# Patient Record
Sex: Male | Born: 1975
Health system: Southern US, Community
[De-identification: ages and names within clinical notes are randomized; demographics above are authoritative.]

## PROBLEM LIST (undated history)

## (undated) DIAGNOSIS — K859 Acute pancreatitis without necrosis or infection, unspecified: Secondary | ICD-10-CM

## (undated) DIAGNOSIS — D701 Agranulocytosis secondary to cancer chemotherapy: Secondary | ICD-10-CM

## (undated) DIAGNOSIS — F17200 Nicotine dependence, unspecified, uncomplicated: Secondary | ICD-10-CM

## (undated) DIAGNOSIS — T451X5A Adverse effect of antineoplastic and immunosuppressive drugs, initial encounter: Secondary | ICD-10-CM

## (undated) DIAGNOSIS — D61818 Other pancytopenia: Secondary | ICD-10-CM

## (undated) DIAGNOSIS — D696 Thrombocytopenia, unspecified: Secondary | ICD-10-CM

## (undated) DIAGNOSIS — C9201 Acute myeloblastic leukemia, in remission: Secondary | ICD-10-CM

## (undated) HISTORY — DX: Acute myeloblastic leukemia, in remission: C92.01

## (undated) HISTORY — DX: Thrombocytopenia, unspecified: D69.6

## (undated) HISTORY — DX: Acute pancreatitis without necrosis or infection, unspecified: K85.90

## (undated) HISTORY — DX: Adverse effect of antineoplastic and immunosuppressive drugs, initial encounter: T45.1X5A

## (undated) HISTORY — DX: Agranulocytosis secondary to cancer chemotherapy: D70.1

## (undated) HISTORY — DX: Nicotine dependence, unspecified, uncomplicated: F17.200

## (undated) HISTORY — PX: BONE MARROW BIOPSY: SHX199

## (undated) HISTORY — DX: Other pancytopenia: D61.818

## (undated) NOTE — *Deleted (*Deleted)
[facility]  [address] [place], [place] [address] Phone: [phone]  Fax: [phone]   Clinic Day:  07/27/2020  Referring physician: Tarri Fuller, FNP  Chief Complaint: Carl [name]. is a 42 y.o. male with AML who is seen for 2 week assessment.  HPI: The patient was last seen in the medical oncology clinic on 07/14/2020. At that time, he was doing well.  He denied fevers, sweats or weight loss.  He had tenderness associated with a new port-a-cath.  The patient saw Dr. Oswald Hillock on 07/20/2020. No note.  He underwent tooth extraction (#17) on 07/26/2020.  The patient was admitted to [facility] on 07/25/2020 for cycle #1 HiDAC consolidation.  The patient had a tooth extracted (#17) on 07/26/2020.  [facility] labs followed: 07/25/2020: Hematocrit 42.4, hemoglobin 13.0, platelets 267,000, WBC 5,000. 07/26/2020: Hematocrit 41.2, hemoglobin 12.7, platelets 248,000, WBC 6,800.  During the interim, ***  No past medical history on file.  No past surgical history on file.  Family History  Problem Relation Age of Onset  . Prostate cancer Neg Hx   . Kidney cancer Neg Hx   . Bladder Cancer Neg Hx     Social History:  reports that he quit smoking about 4 months ago. His smoking use included cigarettes. He has a 17.00 pack-year smoking history. He has never used smokeless tobacco. He reports previous alcohol use of about 6.0 standard drinks of alcohol per week. He reports that he does not use drugs.The patient is alone*** today.  Allergies: No Known Allergies  Current Medications: Current Outpatient Medications  Medication Sig Dispense Refill  . folic acid (FOLVITE) 1 MG tablet Take 1 tablet (1 mg total) by mouth daily.    Marland Kitchen HYDROcodone-acetaminophen (NORCO) 5-325 MG tablet Take 1 tablet by mouth every 6 (six) hours as needed for moderate pain. 12 tablet 0  . magnesium oxide (MAG-OX) 400 MG tablet Take 800 mg by mouth daily.    . Multiple Vitamin  (MULTIVITAMIN WITH MINERALS) TABS tablet Take 1 tablet by mouth daily.    Marland Kitchen thiamine 100 MG tablet Take 1 tablet (100 mg total) by mouth daily.    . valACYclovir (VALTREX) 500 MG tablet Take 500 mg by mouth daily.     No current facility-administered medications for this visit.    Review of Systems  Constitutional: Negative for chills, diaphoresis, fever, malaise/fatigue and weight loss.  HENT: Negative for congestion, ear discharge, ear pain, hearing loss, nosebleeds, sinus pain, sore throat and tinnitus.   Eyes: Negative for blurred vision.  Respiratory: Positive for shortness of breath (improving). Negative for cough, hemoptysis and sputum production.   Cardiovascular: Negative for chest pain, palpitations and leg swelling.       Pain around port site.  Gastrointestinal: Negative for abdominal pain, blood in stool, constipation, diarrhea, heartburn, melena, nausea and vomiting.       Eating well. Hemorrhoids.  Genitourinary: Negative for dysuria, frequency, hematuria and urgency.  Musculoskeletal: Positive for joint pain (left rib x 1 day). Negative for back pain, myalgias and neck pain.  Skin: Negative for itching and rash.  Neurological: Negative for dizziness, tingling, sensory change, weakness and headaches.  Endo/Heme/Allergies: Does not bruise/bleed easily.  Psychiatric/Behavioral: Negative for depression and memory loss. The patient is not nervous/anxious and does not have insomnia.   All other systems reviewed and are negative.  Performance status (ECOG): 0***  Vitals There were no vitals taken for this visit.   Physical Exam Vitals and nursing  note reviewed.  Constitutional:      General: He is not in acute distress.    Appearance: He is not diaphoretic.  HENT:     Head: Normocephalic and atraumatic.     Mouth/Throat:     Mouth: Mucous membranes are moist.     Pharynx: Oropharynx is clear.  Eyes:     General: No scleral icterus.    Extraocular Movements:  Extraocular movements intact.     Conjunctiva/sclera: Conjunctivae normal.     Pupils: Pupils are equal, round, and reactive to light.  Cardiovascular:     Rate and Rhythm: Normal rate and regular rhythm.     Heart sounds: Normal heart sounds. No murmur heard.   Pulmonary:     Effort: Pulmonary effort is normal. No respiratory distress.     Breath sounds: Normal breath sounds. No wheezing or rales.  Chest:     Chest wall: No tenderness.     Comments: Right chest port-a-cath unremarkable.  Area surrounding port is tender to touch without increased warmth or erythema. Abdominal:     General: Bowel sounds are normal. There is no distension.     Palpations: Abdomen is soft. There is no hepatomegaly, splenomegaly or mass.     Tenderness: There is no abdominal tenderness. There is no guarding or rebound.  Musculoskeletal:        General: No swelling or tenderness. Normal range of motion.     Cervical back: Normal range of motion and neck supple.  Lymphadenopathy:     Head:     Right side of head: No preauricular, posterior auricular or occipital adenopathy.     Left side of head: No preauricular, posterior auricular or occipital adenopathy.     Cervical: No cervical adenopathy.     Upper Body:     Right upper body: No supraclavicular or axillary adenopathy.     Left upper body: No supraclavicular or axillary adenopathy.     Lower Body: No right inguinal adenopathy. No left inguinal adenopathy.  Skin:    General: Skin is warm and dry.  Neurological:     Mental Status: He is alert and oriented to person, place, and time.  Psychiatric:        Behavior: Behavior normal.        Thought Content: Thought content normal.        Judgment: Judgment normal.    No visits with results within 3 Day(s) from this visit.  Latest known visit with results is:  No results displayed because visit has over 200 results.      Assessment:  Carl [name]. is a 54 y.o. 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  He has received 2 courses of induction chemotherapy with daunorubicin and cytarabine.  Bone marrow on 04/01/2020 revealed AML.  Bone marrow on 04/26/2020 was hypocellular (5-10%) with 5% blasts.  Bone marrow on 05/24/2020 revealed 30% cellularity with 10% blasts and focal clustering up to 30-40%.  Bone marrow on 07/05/2020 revealed 50% cellularity with 2% blasts.  MRD was negative by flow cytometry.  He received cytarabine and daunorubicin (7+3) beginning 04/13/2020 and 05/27/2020.  He is scheduled to begin HiDAC on 07/25/2020.  Course has been complicated by fever and neutropenia with mucositis (04/27/2020) and gluteal cleft cellulitis (05/06/2020).  He has G6PD deficiency.  G6PD was 1.0 (5.3-10.3) on 04/02/2020.  He received the first COVID-19 vaccine on 07/27/2020.  Symptomatically, ***  Plan: 1.   Acute myelogenous leukemia  Review  entire medical history, diagnoses and treatment to date.  Review complications of treatment with first 2 cycles of induction therapy.  Review transfusion support needed with induction therapy.  Discuss patient's thoughts about stem cell transplant.   Patient is considering.   Review plan for cycle #1 consolidation chemotherapy (high-dose ara-C) on 07/25/2020.   Discuss plans for growth factor support Criss Alvine) on 07/29/2020.  Discuss follow-up counts in [place] and transfusion support.   Twice weekly labs after his chemotherapy, starting around 07/29/2020.   Blood products are leukoreduced and irradiated.   RBC transfusion threshold: transfuse 2 units for Hgb < 8 g/dL.   Platelet transfusion threshold: transfuse 1 unit of platelets for platelet count < 10,000, or for bleeding or need for invasive procedure. 2.   Port-a-cath pain  Patient has pain s/p recent port placement.  No evidence of infection.  Rx: hydrocodone-acetaminophen 5/325 mg 1 tablet po q 6 hours prn pain (dis: #12) 3.   RTC on 07/28/2020 for MD  assessment. 4.   RTC on 07/29/2020 in [place] for [place]. 5.   RTC weekly on Mondays and Thursdays x 4 weeks for labs (CBC with diff and hold tube-on Mondays only). 6.   RTC on 08/01/2020 for MD assessment and labs (CBC with diff, CMP, hold tube).   I discussed the assessment and treatment plan with the patient.  The patient was provided an opportunity to ask questions and all were answered.  The patient agreed with the plan and demonstrated an understanding of the instructions.  The patient was advised to call back if the symptoms worsen or if the condition fails to improve as anticipated.  I provided *** minutes of face-to-face time during this this encounter and > 50% was spent counseling as documented under my assessment and plan.  Melissa C. Merlene Pulling, MD, PhD    07/27/2020, 2:32 PM  I, Danella Penton Tufford, am acting as Neurosurgeon for [employer]. Merlene Pulling, MD, PhD.  I, Melissa C. Merlene Pulling, MD, have reviewed the above documentation for accuracy and completeness, and I agree with the above.

---

## 2004-10-19 ENCOUNTER — Emergency Department: Payer: Self-pay | Admitting: Emergency Medicine

## 2007-09-12 ENCOUNTER — Emergency Department: Payer: Self-pay | Admitting: Internal Medicine

## 2008-03-02 ENCOUNTER — Emergency Department: Payer: Self-pay | Admitting: Emergency Medicine

## 2008-10-20 ENCOUNTER — Emergency Department: Payer: Self-pay | Admitting: Internal Medicine

## 2009-02-01 ENCOUNTER — Emergency Department: Payer: Self-pay | Admitting: Emergency Medicine

## 2010-08-01 ENCOUNTER — Emergency Department: Payer: Self-pay | Admitting: Internal Medicine

## 2013-06-15 ENCOUNTER — Emergency Department: Payer: Self-pay | Admitting: Emergency Medicine

## 2017-10-29 ENCOUNTER — Ambulatory Visit
Admission: RE | Admit: 2017-10-29 | Discharge: 2017-10-29 | Disposition: A | Discharge status: Home / Self Care | Payer: BLUE CROSS/BLUE SHIELD | Source: Ambulatory Visit | Attending: Family Medicine | Admitting: Family Medicine

## 2017-10-29 ENCOUNTER — Other Ambulatory Visit: Payer: Self-pay | Admitting: Family Medicine

## 2017-10-29 DIAGNOSIS — N50812 Left testicular pain: Secondary | ICD-10-CM | POA: Insufficient documentation

## 2017-10-29 DIAGNOSIS — I861 Scrotal varices: Secondary | ICD-10-CM | POA: Diagnosis not present

## 2017-10-31 ENCOUNTER — Encounter: Payer: Self-pay | Admitting: Urology

## 2017-10-31 ENCOUNTER — Ambulatory Visit: Payer: BLUE CROSS/BLUE SHIELD | Admitting: Urology

## 2017-11-01 ENCOUNTER — Ambulatory Visit (INDEPENDENT_AMBULATORY_CARE_PROVIDER_SITE_OTHER): Payer: BLUE CROSS/BLUE SHIELD | Admitting: Urology

## 2017-11-01 ENCOUNTER — Encounter: Payer: Self-pay | Admitting: Urology

## 2017-11-01 VITALS — BP 138/77 | HR 75 | Resp 16 | Ht 69.0 in | Wt 177.8 lb

## 2017-11-01 DIAGNOSIS — N5082 Scrotal pain: Secondary | ICD-10-CM | POA: Diagnosis not present

## 2017-11-01 MED ORDER — MELOXICAM 15 MG PO TABS: 15.0000 mg | ORAL_TABLET | Freq: Every day | ORAL | 0 refills | Status: DC

## 2017-11-01 NOTE — Progress Notes (Signed)
11/01/2017 1:55 PM   Carl [name]. Sep 12, 1975 [id number]  Referring provider: No referring provider defined for this encounter.  Chief complaint: Left testicular pain  HPI: Carl [name] is a 42 year old male who presents with a 6-day history of left scrotal pain.  He states his job involves heavy lifting and bending and earlier this week he had onset of left hemiscrotal pain.  It was usually worse with bending and lifting.  He denies previous history of urologic problems or scrotal pain.  He has no voiding complaints.  Denies dysuria or gross hematuria.  Denies flank, abdominal, pelvic pain.  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  There were no intratesticular masses.  A left varicocele was noted.  He states his pain has improved.  He was placed on meloxicam.   PMH: No past medical history on file.  Surgical History: None  Home Medications:  Allergies as of 11/01/2017   No Known Allergies     Medication List        Accurate as of 11/01/17  1:55 PM. Always use your most recent med list.          meloxicam 15 MG tablet Commonly known as:  MOBIC       Allergies: No Known Allergies  Family History: Family History  Problem Relation Age of Onset  . Prostate cancer Neg Hx   . Kidney cancer Neg Hx   . Bladder Cancer Neg Hx     Social History:  reports that he has quit smoking. He quit after 17.00 years of use. he has never used smokeless tobacco. He reports that he drinks alcohol. He reports that he does not use drugs.  ROS: UROLOGY Frequent Urination?: No Hard to postpone urination?: No Burning/pain with urination?: No Get up at night to urinate?: No Leakage of urine?: No Urine stream starts and stops?: No Trouble starting stream?: No Do you have to strain to urinate?: No Blood in urine?: No Urinary tract infection?: No Sexually transmitted disease?: No Injury to  kidneys or bladder?: No Painful intercourse?: No Weak stream?: No Erection problems?: No Penile pain?: No  Gastrointestinal Nausea?: No Vomiting?: No Indigestion/heartburn?: No Diarrhea?: No Constipation?: No  Constitutional Fever: No Night sweats?: No Weight loss?: No Fatigue?: No  Skin Skin rash/lesions?: No Itching?: No  Eyes Blurred vision?: No Double vision?: No  Ears/Nose/Throat Sore throat?: No Sinus problems?: No  Hematologic/Lymphatic Swollen glands?: No Easy bruising?: No  Cardiovascular Leg swelling?: No Chest pain?: No  Respiratory Cough?: No Shortness of breath?: No  Endocrine Excessive thirst?: No  Musculoskeletal Back pain?: Yes Joint pain?: No  Neurological Headaches?: No Dizziness?: No  Psychologic Depression?: No Anxiety?: No  Physical Exam: BP 138/77   Pulse 75   Resp 16   Ht 5\' 9"  (1.753 m)   Wt 177 lb 12.8 oz (80.6 kg)   SpO2 98%   BMI 26.26 kg/m   Constitutional:  Alert and oriented, No acute distress. HEENT: [place] AT, moist mucus membranes.  Trachea midline, no masses. Cardiovascular: No clubbing, cyanosis, or edema. Respiratory: Normal respiratory effort, no increased work of breathing. GI: Abdomen is soft, nontender, nondistended, no abdominal masses GU: No CVA tenderness.  Penis uncircumcised without lesions.  Testes descended bilaterally.  The left testis is slightly atrophic.  A moderate left varicocele is present.  No epididymal, testicular or scrotal tenderness noted. Lymph: No cervical or inguinal lymphadenopathy. Skin: No rashes, bruises or suspicious  lesions. Neurologic: Grossly intact, no focal deficits, moving all 4 extremities. Psychiatric: Normal mood and affect.   Pertinent Imaging: Ultrasound images were not available for review  Assessment & Plan:   42 year old male with left scrotal pain.  His pain is not typical for varicocele related pain.  He was wearing loose boxer briefs.  I recommended  minimal activity over the weekend and compression shorts.  Refill of meloxicam was sent to his pharmacy.  Follow-up as needed.  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  Monthly testicular self-exam was recommended.    Return if symptoms worsen or fail to improve.  Abbie Sons, [place] [address], [place], [place] [address] [phone]

## 2017-11-06 ENCOUNTER — Other Ambulatory Visit: Payer: Self-pay

## 2018-01-13 ENCOUNTER — Emergency Department
Admission: EM | Admit: 2018-01-13 | Discharge: 2018-01-13 | Disposition: A | Discharge status: Home / Self Care | Payer: BLUE CROSS/BLUE SHIELD | Attending: Emergency Medicine | Admitting: Emergency Medicine

## 2018-01-13 ENCOUNTER — Other Ambulatory Visit: Payer: Self-pay

## 2018-01-13 ENCOUNTER — Emergency Department: Payer: BLUE CROSS/BLUE SHIELD

## 2018-01-13 ENCOUNTER — Encounter: Payer: Self-pay | Admitting: Emergency Medicine

## 2018-01-13 DIAGNOSIS — M419 Scoliosis, unspecified: Secondary | ICD-10-CM | POA: Diagnosis not present

## 2018-01-13 DIAGNOSIS — Z87891 Personal history of nicotine dependence: Secondary | ICD-10-CM | POA: Diagnosis not present

## 2018-01-13 DIAGNOSIS — Z79899 Other long term (current) drug therapy: Secondary | ICD-10-CM | POA: Diagnosis not present

## 2018-01-13 DIAGNOSIS — M4183 Other forms of scoliosis, cervicothoracic region: Secondary | ICD-10-CM | POA: Diagnosis not present

## 2018-01-13 DIAGNOSIS — M546 Pain in thoracic spine: Secondary | ICD-10-CM | POA: Diagnosis not present

## 2018-01-13 DIAGNOSIS — M549 Dorsalgia, unspecified: Secondary | ICD-10-CM | POA: Diagnosis not present

## 2018-01-13 MED ORDER — IBUPROFEN 600 MG PO TABS: 600.0000 mg | ORAL_TABLET | Freq: Three times a day (TID) | ORAL | 0 refills | Status: DC | PRN

## 2018-01-13 MED ORDER — CYCLOBENZAPRINE HCL 10 MG PO TABS: 10.0000 mg | ORAL_TABLET | Freq: Three times a day (TID) | ORAL | 0 refills | Status: DC | PRN

## 2018-01-13 NOTE — ED Notes (Signed)
See triage note  Presents with pain to upper back   Pain is mainly under right shoulder blade  Denies any injury  States pain is increased with cough or movement

## 2018-01-13 NOTE — ED Provider Notes (Signed)
[facility] Emergency Department Provider Note   ____________________________________________   First MD Initiated Contact with Patient 01/13/18 1001     (approximate)  I have reviewed the triage vital signs and the nursing notes.   HISTORY  Chief Complaint Back Pain    HPI Carl [name]. is a 42 y.o. male patient complain of 2 days of increasing right upper back pain without provocative incident.  Patient worked normally requires repetitive overhead heavy lifting.  Patient admits to positive tobacco use.  Patient complaint increase with coughing, shoulder abduction, and overhead reaching.  Patient rates the pain as a 9/10.  No palliative measures for complaint.  Patient described the pain is "achy".  History reviewed. No pertinent past medical history.  There are no active problems to display for this patient.   History reviewed. No pertinent surgical history.  Prior to Admission medications   Medication Sig Start Date End Date Taking? Authorizing Provider  cyclobenzaprine (FLEXERIL) 10 MG tablet Take 1 tablet (10 mg total) by mouth 3 (three) times daily as needed. 01/13/18   Sable Feil, PA-C  ibuprofen (ADVIL,MOTRIN) 600 MG tablet Take 1 tablet (600 mg total) by mouth every 8 (eight) hours as needed. 01/13/18   Sable Feil, PA-C  meloxicam (MOBIC) 15 MG tablet Take 1 tablet (15 mg total) by mouth daily. 11/01/17   Stoioff, Ronda Fairly, MD    Allergies Patient has no known allergies.  Family History  Problem Relation Age of Onset  . Prostate cancer Neg Hx   . Kidney cancer Neg Hx   . Bladder Cancer Neg Hx     Social History Social History   Tobacco Use  . Smoking status: Former Smoker    Years: 17.00  . Smokeless tobacco: Never Used  Substance Use Topics  . Alcohol use: Yes  . Drug use: No    Review of Systems Constitutional: No fever/chills Eyes: No visual changes. ENT: No sore throat. Cardiovascular: Denies chest  pain. Respiratory: Denies shortness of breath. Gastrointestinal: No abdominal pain.  No nausea, no vomiting.  No diarrhea.  No constipation. Genitourinary: Negative for dysuria. Musculoskeletal: Positive for upper back pain. Skin: Negative for rash. Neurological: Negative for headaches, focal weakness or numbness.   ____________________________________________   PHYSICAL EXAM:  VITAL SIGNS: ED Triage Vitals  Enc Vitals Group     BP 01/13/18 0952 111/64     Pulse Rate 01/13/18 0952 (!) 59     Resp 01/13/18 0952 18     Temp 01/13/18 0952 (!) 97.5 F (36.4 C)     Temp Source 01/13/18 0952 Oral     SpO2 01/13/18 0952 100 %     Weight 01/13/18 0943 175 lb (79.4 kg)     Height 01/13/18 0943 5\' 9"  (1.753 m)     Head Circumference --      Peak Flow --      Pain Score 01/13/18 0943 9     Pain Loc --      Pain Edu? --      Excl. in [place]? --    Constitutional: Alert and oriented. Well appearing and in no acute distress. Cardiovascular: Normal rate, regular rhythm. Grossly normal heart sounds.  Good peripheral circulation. Respiratory: Normal respiratory effort.  No retractions. Lungs CTAB. Musculoskeletal: Obvious thoracic deformity.  Patient has decreased range of motion with abduction overhead reaching of the right upper extremity.  Patient points to the scapular muscle group as a source of pain. Neurologic:  Normal speech and language. No gross focal neurologic deficits are appreciated. No gait instability. Skin:  Skin is warm, dry and intact. No rash noted. Psychiatric: Mood and affect are normal. Speech and behavior are normal.  ____________________________________________   LABS (all labs ordered are listed, but only abnormal results are displayed)  Labs Reviewed - No data to display ____________________________________________  EKG   ____________________________________________  RADIOLOGY  ED MD interpretation:    Official radiology report(s): Dg Thoracic Spine 2  View  Result Date: 01/13/2018 CLINICAL DATA:  Upper back pain, right greater than left. EXAM: THORACIC SPINE 2 VIEWS COMPARISON:  None. FINDINGS: 16 degrees dextroconvex scoliosis centered at T5. Lack of fusion of the posterior elements involving C6 through T3. No additional definitive segmentation anomalies. Vertebral body height is normal. Cervicothoracic junction is in alignment. Visualized portion of the chest is unremarkable. IMPRESSION: Dextroconvex scoliosis centered at T5, with lack of fusion of the posterior elements involving C6 through T3. Electronically Signed   By: Lorin Picket M.D.   On: 01/13/2018 11:02    ____________________________________________   PROCEDURES  Procedure(s) performed: None  Procedures  Critical Care performed: No  ____________________________________________   INITIAL IMPRESSION / ASSESSMENT AND PLAN / ED COURSE  As part of my medical decision making, I reviewed the following data within the electronic MEDICAL RECORD NUMBER    Right upper back pain secondary to scoliosis.  Discussed x-ray findings with patient.  Patient given discharge care instruction advised follow-up with orthopedics.  Take medication as directed.      ____________________________________________   FINAL CLINICAL IMPRESSION(S) / ED DIAGNOSES  Final diagnoses:  Scoliosis of cervicothoracic spine, unspecified scoliosis type     ED Discharge Orders        Ordered    cyclobenzaprine (FLEXERIL) 10 MG tablet  3 times daily PRN     01/13/18 1140    ibuprofen (ADVIL,MOTRIN) 600 MG tablet  Every 8 hours PRN     01/13/18 1140       Note:  This document was prepared using Dragon voice recognition software and may include unintentional dictation errors.    Sable Feil, PA-C 01/13/18 1144    Schaevitz, Randall An, MD 01/13/18 1328

## 2018-01-13 NOTE — Discharge Instructions (Signed)
Call orthopedic clinic to schedule appointment this week for definitive evaluation and treatment.

## 2018-01-13 NOTE — ED Triage Notes (Signed)
Back pain .  Denies injury.  stareted 2 day ago when he woke up

## 2018-01-16 DIAGNOSIS — M419 Scoliosis, unspecified: Secondary | ICD-10-CM | POA: Diagnosis not present

## 2019-01-05 ENCOUNTER — Telehealth: Payer: Self-pay | Admitting: Podiatry

## 2019-01-05 NOTE — Telephone Encounter (Signed)
Pt is scheduled for an appt on 5/19 but is concerned about his toenails being dark/black and would like to know if there is something he can do in the meantime. Pt was unable to come in until the 19th due to his work schedule. Please give patient a call.

## 2019-01-07 NOTE — Telephone Encounter (Signed)
Returned call ,left message to call back.

## 2019-01-13 ENCOUNTER — Ambulatory Visit: Payer: BLUE CROSS/BLUE SHIELD | Admitting: Podiatry

## 2019-11-20 ENCOUNTER — Ambulatory Visit: Payer: BLUE CROSS/BLUE SHIELD | Admitting: Family Medicine

## 2019-11-23 ENCOUNTER — Encounter: Payer: Self-pay | Admitting: Family Medicine

## 2019-11-23 ENCOUNTER — Other Ambulatory Visit: Payer: Self-pay

## 2019-11-23 ENCOUNTER — Ambulatory Visit (INDEPENDENT_AMBULATORY_CARE_PROVIDER_SITE_OTHER): Payer: BC Managed Care – PPO | Admitting: Family Medicine

## 2019-11-23 VITALS — BP 126/63 | HR 84 | Temp 97.1°F | Ht 70.0 in | Wt 179.6 lb

## 2019-11-23 DIAGNOSIS — R3129 Other microscopic hematuria: Secondary | ICD-10-CM

## 2019-11-23 DIAGNOSIS — M545 Low back pain, unspecified: Secondary | ICD-10-CM | POA: Insufficient documentation

## 2019-11-23 DIAGNOSIS — R109 Unspecified abdominal pain: Secondary | ICD-10-CM

## 2019-11-23 DIAGNOSIS — Z0000 Encounter for general adult medical examination without abnormal findings: Secondary | ICD-10-CM

## 2019-11-23 DIAGNOSIS — G8929 Other chronic pain: Secondary | ICD-10-CM

## 2019-11-23 DIAGNOSIS — M549 Dorsalgia, unspecified: Secondary | ICD-10-CM | POA: Insufficient documentation

## 2019-11-23 DIAGNOSIS — Z7689 Persons encountering health services in other specified circumstances: Secondary | ICD-10-CM

## 2019-11-23 LAB — POCT URINALYSIS DIPSTICK
Glucose, UA: NEGATIVE
Ketones, UA: NEGATIVE
Leukocytes, UA: NEGATIVE
Nitrite, UA: NEGATIVE
Protein, UA: NEGATIVE
Spec Grav, UA: 1.01 (ref 1.010–1.025)
Urobilinogen, UA: 0.2 E.U./dL
pH, UA: 5 (ref 5.0–8.0)

## 2019-11-23 NOTE — Patient Instructions (Addendum)
As we discussed, have your labs drawn in the next 1-2 weeks and we will contact you with the results.  Reduce your soda and coffee intake a little bit each day and begin incorporating more water every day.  Should aim for 64 ounces of water daily.  We have sent your urine for microscopy and will contact you once we receive the results.  With your reported history of scoliosis and intermittent flares of lower back pain, I have put in a referral to physical therapy to help with stretching and strengthening your lower back and core.             Low Back Pain Exercises  See other page with pictures of each exercise.  Start with 1 or 2 of these exercises that you are most comfortable with. Do not do any exercises that cause you significant worsening pain. Some of these may cause some "stretching soreness" but it should go away after you stop the exercise, and get better over time. Gradually increase up to 3-4 exercises as tolerated.  Standing hamstring stretch: Place the heel of your leg on a stool about 15 inches high. Keep your knee straight. Lean forward, bending at the hips until you feel a mild stretch in the back of your thigh. Make sure you do not roll your shoulders and bend at the waist when doing this or you will stretch your lower back instead. Hold the stretch for 15 to 30 seconds. Repeat 3 times. Repeat the same stretch on your other leg.  Cat and camel: Get down on your hands and knees. Let your stomach sag, allowing your back to curve downward. Hold this position for 5 seconds. Then arch your back and hold for 5 seconds. Do 3 sets of 10.  Quadriped Arm/Leg Raises: Get down on your hands and knees. Tighten your abdominal muscles to stiffen your spine. While keeping your abdominals tight, raise one arm and the opposite leg away from you. Hold this position for 5 seconds. Lower your arm and leg slowly and alternate sides. Do this 10 times on each side.  Pelvic tilt: Lie on your  back with your knees bent and your feet flat on the floor. Tighten your abdominal muscles and push your lower back into the floor. Hold this position for 5 seconds, then relax. Do 3 sets of 10.  Partial curl: Lie on your back with your knees bent and your feet flat on the floor. Tighten your stomach muscles and flatten your back against the floor. Tuck your chin to your chest. With your hands stretched out in front of you, curl your upper body forward until your shoulders clear the floor. Hold this position for 3 seconds. Don't hold your breath. It helps to breathe out as you lift your shoulders up. Relax. Repeat 10 times. Build to 3 sets of 10. To challenge yourself, clasp your hands behind your head and keep your elbows out to the side.  Lower trunk rotation: Lie on your back with your knees bent and your feet flat on the floor. Tighten your abdominal muscles and push your lower back into the floor. Keeping your shoulders down flat, gently rotate your legs to one side, then the other as far as you can. Repeat 10 to 20 times.  Single knee to chest stretch: Lie on your back with your legs straight out in front of you. Bring one knee up to your chest and grasp the back of your thigh. Pull your knee toward  your chest, stretching your buttock muscle. Hold this position for 15 to 30 seconds and return to the starting position. Repeat 3 times on each side.  Double knee to chest: Lie on your back with your knees bent and your feet flat on the floor. Tighten your abdominal muscles and push your lower back into the floor. Pull both knees up to your chest. Hold for 5 seconds and repeat 10 to 20 times.  We will plan to see you back in 1 month for your yearly physical wellness exam and preventative screenings.  You will receive a survey after today's visit either digitally by e-mail or paper by [employer]. Your experiences and feedback matter to [name].  Please respond so we know how we are doing as we provide care for  you.  Call us with any questions/concerns/needs.  It is my goal to be available to you for your health concerns.  Thanks for choosing me to be a partner in your healthcare needs!  Harlin Rain, FNP-C Family Nurse Practitioner [facility] Phone: [phone]

## 2019-11-23 NOTE — Assessment & Plan Note (Signed)
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  Reports has not been to physical therapy and without any structured exercise routine.  Plan: 1) Begin physical therapy and begin working on strengthening your lower back and core to help reduce low back pain and protect from injuries

## 2019-11-23 NOTE — Progress Notes (Signed)
Subjective:    Patient ID: Carl [name]., male    DOB: Aug 21, 1976, 44 y.o.   MRN: [id number]  Carl [name]. is a 44 y.o. male presenting on 11/23/2019 for [place] (intermittent left side flank pain x 3 days. He denies any urinary issues, no urgency, dysuria or frequency. He state that the pain was so severe on Friday that he had to leave work early. The pain has since improved, but not resolved. )   HPI  Previous PCP was at Dr. Soyla Dryer in [place], [place] probably seen last within the last 3 years.  Records will be requested.  Past medical, family, and surgical history reviewed w/ pt.  Has acute concerns of left sided flank pain that has come and gone in the past, recent flare on Friday, that caused him to leave work early and stay in bed most of Saturday.  Denied anything that made it better or worse but eased on its own.  Reports last month the same thing happened and fully resolved until Friday.    Depression screen PHQ 2/9 11/23/2019  Decreased Interest 0  Down, Depressed, Hopeless 0  PHQ - 2 Score 0    Social History   Tobacco Use  . Smoking status: Current Every Day Smoker    Packs/day: 1.00    Years: 17.00    Pack years: 17.00    Types: Cigarettes  . Smokeless tobacco: Never Used  Substance Use Topics  . Alcohol use: Yes    Alcohol/week: 6.0 standard drinks    Types: 6 Cans of beer per week    Comment: 6-24oz beers on weekly bases  . Drug use: No    Review of Systems  Constitutional: Negative.   HENT: Negative.   Eyes: Negative.   Respiratory: Negative.   Cardiovascular: Negative.   Gastrointestinal: Negative.   Endocrine: Negative.   Genitourinary: Positive for flank pain. Negative for decreased urine volume, difficulty urinating, discharge, dysuria, enuresis, frequency, genital sores, hematuria, penile pain, penile swelling, scrotal swelling, testicular pain and urgency.  Musculoskeletal: Positive for back pain. Negative for arthralgias, gait problem,  joint swelling, myalgias, neck pain and neck stiffness.  Skin: Negative.   Allergic/Immunologic: Negative.   Neurological: Negative.   Hematological: Negative.   Psychiatric/Behavioral: Negative.    Per HPI unless specifically indicated above     Objective:    BP 126/63 (BP Location: Left Arm, Patient Position: Sitting, Cuff Size: Normal)   Pulse 84   Temp (!) 97.1 F (36.2 C) (Temporal)   Ht 5\' 10"  (1.778 m)   Wt 179 lb 9.6 oz (81.5 kg)   BMI 25.77 kg/m   Wt Readings from Last 3 Encounters:  11/23/19 179 lb 9.6 oz (81.5 kg)  01/13/18 175 lb (79.4 kg)  11/01/17 177 lb 12.8 oz (80.6 kg)    Physical Exam Vitals reviewed.  Constitutional:      General: He is not in acute distress.    Appearance: Normal appearance. He is well-developed, well-groomed and overweight. He is not ill-appearing or toxic-appearing.  HENT:     Head: Normocephalic.     Right Ear: Tympanic membrane, ear canal and external ear normal. There is no impacted cerumen.     Left Ear: Tympanic membrane, ear canal and external ear normal. There is no impacted cerumen.     Nose: Nose normal. No congestion or rhinorrhea.     Mouth/Throat:     Mouth: Mucous membranes are moist.     Pharynx: Oropharynx is clear.  No oropharyngeal exudate or posterior oropharyngeal erythema.  Eyes:     General: Lids are normal. Vision grossly intact. No scleral icterus.       Right eye: No discharge or hordeolum.        Left eye: No discharge or hordeolum.     Extraocular Movements: Extraocular movements intact.     Conjunctiva/sclera: Conjunctivae normal.     Pupils: Pupils are equal, round, and reactive to light.  Neck:     Thyroid: No thyroid mass, thyromegaly or thyroid tenderness.  Cardiovascular:     Rate and Rhythm: Normal rate and regular rhythm.     Pulses: Normal pulses.          Dorsalis pedis pulses are 2+ on the right side and 2+ on the left side.       Posterior tibial pulses are 2+ on the right side and 2+ on the  left side.     Heart sounds: Normal heart sounds. No murmur. No friction rub. No gallop.   Pulmonary:     Effort: Pulmonary effort is normal. No respiratory distress.     Breath sounds: Normal breath sounds.  Abdominal:     General: Abdomen is flat. Bowel sounds are normal. There is no distension or abdominal bruit.     Palpations: Abdomen is soft. There is no hepatomegaly, splenomegaly or mass.     Tenderness: There is no abdominal tenderness. There is no guarding or rebound.     Hernia: No hernia is present.  Musculoskeletal:        General: No swelling, tenderness or deformity. Normal range of motion.     Cervical back: Normal, normal range of motion and neck supple. No tenderness.     Thoracic back: Normal.     Lumbar back: No swelling, edema, deformity, signs of trauma, lacerations, spasms, tenderness or bony tenderness. Normal range of motion. Negative right straight leg raise test and negative left straight leg raise test. No scoliosis.     Right lower leg: No edema.     Left lower leg: No edema.  Feet:     Right foot:     Skin integrity: Skin integrity normal.     Left foot:     Skin integrity: Skin integrity normal.  Lymphadenopathy:     Cervical: No cervical adenopathy.  Skin:    General: Skin is warm and dry.     Capillary Refill: Capillary refill takes less than 2 seconds.  Neurological:     General: No focal deficit present.     Mental Status: He is alert and oriented to person, place, and time.     Cranial Nerves: Cranial nerves are intact.     Sensory: Sensation is intact.     Motor: Motor function is intact.     Coordination: Coordination is intact.     Gait: Gait is intact.     Deep Tendon Reflexes: Reflexes normal.  Psychiatric:        Attention and Perception: Attention and perception normal.        Mood and Affect: Mood and affect normal.        Speech: Speech normal.        Behavior: Behavior normal. Behavior is cooperative.        Thought Content:  Thought content normal.        Cognition and Memory: Cognition and memory normal.        Judgment: Judgment normal.     Results for orders placed  or performed in visit on 11/23/19  POCT Urinalysis Dipstick  Result Value Ref Range   Color, UA yellow    Clarity, UA clear    Glucose, UA Negative Negative   Bilirubin, UA small    Ketones, UA negative    Spec Grav, UA 1.010 1.010 - 1.025   Blood, UA moderate    pH, UA 5.0 5.0 - 8.0   Protein, UA Negative Negative   Urobilinogen, UA 0.2 0.2 or 1.0 E.U./dL   Nitrite, UA negative    Leukocytes, UA Negative Negative   Appearance     Odor        Assessment & Plan:   Problem List Items Addressed This Visit      Other   Flank pain - Primary    Flank pain, discussed could be kidney stones based on the coming/going of pain and negative U/A POCT for leukocytes or nitrites.  Blood on U/A POCT, will send to lab for microscopy for further evaluation and based on results will repeat/send to urology for evaluation.  Plan: 1) Urine sent to the lab for microscopy 2) Once results are received, will contact patient and update treatment plan.      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  Reports has not been to physical therapy and without any structured exercise routine.  Plan: 1) Begin physical therapy and begin working on strengthening your lower back and core to help reduce low back pain and protect from injuries      Relevant Orders   Ambulatory referral to Physical Therapy   Encounter to establish care with new doctor    Here as new patient establishment, last followed with provider, Dr. Soyla Dryer in [place], last visit approx 2018, will request records for review.  Plan: 1) Will have labs drawn in the next 1-2 weeks and will contact with the results 2) Will see  back in clinic for physical and preventative care screenings in next 4-6 weeks. 3) See Flank pain A/P       Other Visit Diagnoses    General medical exam       Relevant Orders   CBC with Differential   COMPLETE METABOLIC PANEL WITH GFR   Lipid Profile   Thyroid Panel With TSH   HgB A1c   Microscopic hematuria       Relevant Orders   Urinalysis, microscopic only      No orders of the defined types were placed in this encounter.     Follow up plan: Return in about 4 weeks (around 12/21/2019) for Physical.   Harlin Rain, [place] Nurse Practitioner [facility] 11/23/2019, 3:28 PM

## 2019-11-23 NOTE — Assessment & Plan Note (Signed)
Here as new patient establishment, last followed with provider, Dr. Soyla Dryer in [place], last visit approx 2018, will request records for review.  Plan: 1) Will have labs drawn in the next 1-2 weeks and will contact with the results 2) Will see back in clinic for physical and preventative care screenings in next 4-6 weeks. 3) See Flank pain A/P

## 2019-11-23 NOTE — Assessment & Plan Note (Signed)
Flank pain, discussed could be kidney stones based on the coming/going of pain and negative U/A POCT for leukocytes or nitrites.  Blood on U/A POCT, will send to lab for microscopy for further evaluation and based on results will repeat/send to urology for evaluation.  Plan: 1) Urine sent to the lab for microscopy 2) Once results are received, will contact patient and update treatment plan.

## 2019-11-24 ENCOUNTER — Other Ambulatory Visit: Payer: Self-pay | Admitting: Family Medicine

## 2019-11-24 DIAGNOSIS — R8290 Unspecified abnormal findings in urine: Secondary | ICD-10-CM

## 2019-11-24 LAB — URINALYSIS, MICROSCOPIC ONLY
Bacteria, UA: NONE SEEN /HPF
Hyaline Cast: NONE SEEN /LPF
RBC / HPF: NONE SEEN /HPF (ref 0–2)
Squamous Epithelial / LPF: NONE SEEN /HPF (ref <=5)
WBC, UA: NONE SEEN /HPF (ref 0–5)

## 2019-11-24 NOTE — Progress Notes (Signed)
U/A Microscopy did not show any blood.  We can plan to repeat a U/A in 2 weeks to make sure has resolved.  I put in the order.  Thanks

## 2019-12-10 ENCOUNTER — Other Ambulatory Visit: Payer: BC Managed Care – PPO

## 2019-12-10 ENCOUNTER — Other Ambulatory Visit: Payer: Self-pay

## 2019-12-11 ENCOUNTER — Other Ambulatory Visit: Payer: Self-pay | Admitting: Family Medicine

## 2019-12-11 DIAGNOSIS — R7989 Other specified abnormal findings of blood chemistry: Secondary | ICD-10-CM

## 2019-12-11 LAB — URINALYSIS, ROUTINE W REFLEX MICROSCOPIC
Bilirubin Urine: NEGATIVE
Glucose, UA: NEGATIVE
Hgb urine dipstick: NEGATIVE
Ketones, ur: NEGATIVE
Leukocytes,Ua: NEGATIVE
Nitrite: NEGATIVE
Protein, ur: NEGATIVE
Specific Gravity, Urine: 1.016 (ref 1.001–1.03)
pH: 7.5 (ref 5.0–8.0)

## 2019-12-11 LAB — CBC WITH DIFFERENTIAL/PLATELET
Absolute Monocytes: 42 cells/uL — ABNORMAL LOW (ref 200–950)
Basophils Absolute: 0 cells/uL (ref 0–200)
Basophils Relative: 0 %
Eosinophils Absolute: 11 cells/uL — ABNORMAL LOW (ref 15–500)
Eosinophils Relative: 1 %
HCT: 36.9 % — ABNORMAL LOW (ref 38.5–50.0)
Hemoglobin: 12.5 g/dL — ABNORMAL LOW (ref 13.2–17.1)
Lymphs Abs: 713 cells/uL — ABNORMAL LOW (ref 850–3900)
MCH: 35.6 pg — ABNORMAL HIGH (ref 27.0–33.0)
MCHC: 33.9 g/dL (ref 32.0–36.0)
MCV: 105.1 fL — ABNORMAL HIGH (ref 80.0–100.0)
MPV: 10.1 fL (ref 7.5–12.5)
Monocytes Relative: 3.8 %
Neutro Abs: 334 cells/uL — CL (ref 1500–7800)
Neutrophils Relative %: 30.4 %
Platelets: 131 10*3/uL — ABNORMAL LOW (ref 140–400)
RBC: 3.51 10*6/uL — ABNORMAL LOW (ref 4.20–5.80)
RDW: 14.2 % (ref 11.0–15.0)
Total Lymphocyte: 64.8 %
WBC: 1.1 10*3/uL — ABNORMAL LOW (ref 3.8–10.8)

## 2019-12-11 LAB — COMPLETE METABOLIC PANEL WITH GFR
AG Ratio: 1.5 (calc) (ref 1.0–2.5)
ALT: 28 U/L (ref 9–46)
AST: 22 U/L (ref 10–40)
Albumin: 4.4 g/dL (ref 3.6–5.1)
Alkaline phosphatase (APISO): 87 U/L (ref 36–130)
BUN: 13 mg/dL (ref 7–25)
CO2: 26 mmol/L (ref 20–32)
Calcium: 9.4 mg/dL (ref 8.6–10.3)
Chloride: 103 mmol/L (ref 98–110)
Creat: 0.94 mg/dL (ref 0.60–1.35)
GFR, Est African American: 114 mL/min/{1.73_m2} (ref >=60)
GFR, Est Non African American: 98 mL/min/{1.73_m2} (ref >=60)
Globulin: 2.9 g/dL (calc) (ref 1.9–3.7)
Glucose, Bld: 96 mg/dL (ref 65–99)
Potassium: 4.8 mmol/L (ref 3.5–5.3)
Sodium: 136 mmol/L (ref 135–146)
Total Bilirubin: 0.7 mg/dL (ref 0.2–1.2)
Total Protein: 7.3 g/dL (ref 6.1–8.1)

## 2019-12-11 LAB — THYROID PANEL WITH TSH
Free Thyroxine Index: 1.7 (ref 1.4–3.8)
T3 Uptake: 36 % — ABNORMAL HIGH (ref 22–35)
T4, Total: 4.7 ug/dL — ABNORMAL LOW (ref 4.9–10.5)
TSH: 0.76 mIU/L (ref 0.40–4.50)

## 2019-12-11 LAB — LIPID PANEL
Cholesterol: 153 mg/dL (ref <200)
HDL: 71 mg/dL (ref >=40)
LDL Cholesterol (Calc): 73 mg/dL (calc)
Non-HDL Cholesterol (Calc): 82 mg/dL (calc) (ref <130)
Total CHOL/HDL Ratio: 2.2 (calc) (ref <5.0)
Triglycerides: 33 mg/dL (ref <150)

## 2019-12-11 LAB — HEMOGLOBIN A1C
Hgb A1c MFr Bld: 4.5 % of total Hgb (ref <5.7)
Mean Plasma Glucose: 82 (calc)
eAG (mmol/L): 4.6 (calc)

## 2019-12-11 NOTE — Progress Notes (Signed)
Spoke with patient regarding abnormal CBC, that urgent referral was placed to Hematology.  Discussed ER precautions such as SOB, dizziness, lightheadedness, unexplained bruising/bleeding to proceed to the ER immediately.  Patient aware, verbalized understanding and denied any additional questions/concerns/needs

## 2019-12-15 ENCOUNTER — Telehealth: Payer: Self-pay | Admitting: Family Medicine

## 2019-12-15 ENCOUNTER — Encounter: Payer: Self-pay | Admitting: Family Medicine

## 2019-12-15 NOTE — Progress Notes (Signed)
Letter sent certified to patient requesting contact our office and Melissa at Heme/Onc to schedule urgent referral appointment.  Certified mail tracking# with [place]

## 2019-12-15 NOTE — Telephone Encounter (Signed)
Certified letter mailed to patient.  ° °

## 2019-12-15 NOTE — Telephone Encounter (Signed)
Left message for Mr. Mccargo to return call.  Awaiting his call to give him the contact information for [facility], referral coordinator at Hematology/Oncology at [phone].    Left message for emergency contact, Mr. Bol Sr. To return call as well.

## 2020-03-28 ENCOUNTER — Other Ambulatory Visit: Payer: Self-pay

## 2020-03-28 ENCOUNTER — Encounter: Payer: Self-pay | Admitting: *Deleted

## 2020-03-28 DIAGNOSIS — D61818 Other pancytopenia: Secondary | ICD-10-CM | POA: Diagnosis present

## 2020-03-28 DIAGNOSIS — R5081 Fever presenting with conditions classified elsewhere: Secondary | ICD-10-CM | POA: Diagnosis present

## 2020-03-28 DIAGNOSIS — R609 Edema, unspecified: Secondary | ICD-10-CM | POA: Diagnosis present

## 2020-03-28 DIAGNOSIS — D709 Neutropenia, unspecified: Secondary | ICD-10-CM | POA: Diagnosis not present

## 2020-03-28 DIAGNOSIS — Z20822 Contact with and (suspected) exposure to covid-19: Secondary | ICD-10-CM | POA: Diagnosis present

## 2020-03-28 DIAGNOSIS — K859 Acute pancreatitis without necrosis or infection, unspecified: Secondary | ICD-10-CM | POA: Diagnosis present

## 2020-03-28 DIAGNOSIS — K429 Umbilical hernia without obstruction or gangrene: Secondary | ICD-10-CM | POA: Diagnosis not present

## 2020-03-28 DIAGNOSIS — R748 Abnormal levels of other serum enzymes: Secondary | ICD-10-CM | POA: Diagnosis present

## 2020-03-28 DIAGNOSIS — A419 Sepsis, unspecified organism: Secondary | ICD-10-CM | POA: Diagnosis not present

## 2020-03-28 DIAGNOSIS — E538 Deficiency of other specified B group vitamins: Secondary | ICD-10-CM | POA: Diagnosis present

## 2020-03-28 DIAGNOSIS — R05 Cough: Secondary | ICD-10-CM | POA: Diagnosis present

## 2020-03-28 DIAGNOSIS — F17210 Nicotine dependence, cigarettes, uncomplicated: Secondary | ICD-10-CM | POA: Diagnosis present

## 2020-03-28 DIAGNOSIS — R7881 Bacteremia: Secondary | ICD-10-CM | POA: Diagnosis present

## 2020-03-28 DIAGNOSIS — C9500 Acute leukemia of unspecified cell type not having achieved remission: Secondary | ICD-10-CM | POA: Diagnosis present

## 2020-03-28 DIAGNOSIS — K420 Umbilical hernia with obstruction, without gangrene: Secondary | ICD-10-CM | POA: Diagnosis present

## 2020-03-28 DIAGNOSIS — R000 Tachycardia, unspecified: Secondary | ICD-10-CM | POA: Diagnosis present

## 2020-03-28 DIAGNOSIS — R0989 Other specified symptoms and signs involving the circulatory and respiratory systems: Secondary | ICD-10-CM | POA: Diagnosis not present

## 2020-03-28 DIAGNOSIS — F1010 Alcohol abuse, uncomplicated: Secondary | ICD-10-CM | POA: Diagnosis present

## 2020-03-28 DIAGNOSIS — R5383 Other fatigue: Secondary | ICD-10-CM | POA: Diagnosis not present

## 2020-03-28 DIAGNOSIS — B954 Other streptococcus as the cause of diseases classified elsewhere: Secondary | ICD-10-CM | POA: Diagnosis present

## 2020-03-28 DIAGNOSIS — R1033 Periumbilical pain: Secondary | ICD-10-CM | POA: Diagnosis present

## 2020-03-28 MED ORDER — SODIUM CHLORIDE 0.9% FLUSH: 3.0000 mL | Freq: Once | INTRAVENOUS | Status: DC

## 2020-03-28 NOTE — ED Triage Notes (Signed)
Pt to triage via wheelchair.  Pt has abd pain.  No n/v/d.  No diff urinating.  Pain for 1 day.  Pt also reports a cough.  cig smoker.  Pt alert.

## 2020-03-29 ENCOUNTER — Emergency Department: Payer: BC Managed Care – PPO

## 2020-03-29 ENCOUNTER — Inpatient Hospital Stay
Admission: EM | Admit: 2020-03-29 | Discharge: 2020-04-01 | DRG: 808 | Disposition: A | Discharge status: Other Acute Inpatient Hospital | Payer: BC Managed Care – PPO | Attending: Internal Medicine | Admitting: Internal Medicine

## 2020-03-29 ENCOUNTER — Encounter: Payer: Self-pay | Admitting: Radiology

## 2020-03-29 DIAGNOSIS — D61818 Other pancytopenia: Secondary | ICD-10-CM | POA: Diagnosis not present

## 2020-03-29 DIAGNOSIS — C9500 Acute leukemia of unspecified cell type not having achieved remission: Secondary | ICD-10-CM | POA: Diagnosis present

## 2020-03-29 DIAGNOSIS — D709 Neutropenia, unspecified: Principal | ICD-10-CM

## 2020-03-29 DIAGNOSIS — C9590 Leukemia, unspecified not having achieved remission: Secondary | ICD-10-CM | POA: Diagnosis not present

## 2020-03-29 DIAGNOSIS — K8689 Other specified diseases of pancreas: Secondary | ICD-10-CM | POA: Diagnosis not present

## 2020-03-29 DIAGNOSIS — R748 Abnormal levels of other serum enzymes: Secondary | ICD-10-CM | POA: Diagnosis present

## 2020-03-29 DIAGNOSIS — K429 Umbilical hernia without obstruction or gangrene: Secondary | ICD-10-CM | POA: Diagnosis not present

## 2020-03-29 DIAGNOSIS — K259 Gastric ulcer, unspecified as acute or chronic, without hemorrhage or perforation: Secondary | ICD-10-CM | POA: Diagnosis not present

## 2020-03-29 DIAGNOSIS — R7881 Bacteremia: Secondary | ICD-10-CM | POA: Diagnosis not present

## 2020-03-29 DIAGNOSIS — R05 Cough: Secondary | ICD-10-CM | POA: Diagnosis present

## 2020-03-29 DIAGNOSIS — Z20822 Contact with and (suspected) exposure to covid-19: Secondary | ICD-10-CM | POA: Diagnosis not present

## 2020-03-29 DIAGNOSIS — R1013 Epigastric pain: Secondary | ICD-10-CM | POA: Diagnosis not present

## 2020-03-29 DIAGNOSIS — R5081 Fever presenting with conditions classified elsewhere: Secondary | ICD-10-CM

## 2020-03-29 DIAGNOSIS — A419 Sepsis, unspecified organism: Secondary | ICD-10-CM | POA: Diagnosis not present

## 2020-03-29 DIAGNOSIS — R000 Tachycardia, unspecified: Secondary | ICD-10-CM | POA: Diagnosis present

## 2020-03-29 DIAGNOSIS — R0989 Other specified symptoms and signs involving the circulatory and respiratory systems: Secondary | ICD-10-CM | POA: Diagnosis not present

## 2020-03-29 DIAGNOSIS — K420 Umbilical hernia with obstruction, without gangrene: Secondary | ICD-10-CM | POA: Diagnosis not present

## 2020-03-29 DIAGNOSIS — R109 Unspecified abdominal pain: Secondary | ICD-10-CM

## 2020-03-29 DIAGNOSIS — F17210 Nicotine dependence, cigarettes, uncomplicated: Secondary | ICD-10-CM | POA: Diagnosis not present

## 2020-03-29 DIAGNOSIS — K859 Acute pancreatitis without necrosis or infection, unspecified: Secondary | ICD-10-CM

## 2020-03-29 DIAGNOSIS — F1010 Alcohol abuse, uncomplicated: Secondary | ICD-10-CM | POA: Diagnosis not present

## 2020-03-29 DIAGNOSIS — B955 Unspecified streptococcus as the cause of diseases classified elsewhere: Secondary | ICD-10-CM

## 2020-03-29 DIAGNOSIS — R609 Edema, unspecified: Secondary | ICD-10-CM | POA: Diagnosis present

## 2020-03-29 DIAGNOSIS — B954 Other streptococcus as the cause of diseases classified elsewhere: Secondary | ICD-10-CM | POA: Diagnosis present

## 2020-03-29 DIAGNOSIS — D619 Aplastic anemia, unspecified: Secondary | ICD-10-CM | POA: Diagnosis not present

## 2020-03-29 DIAGNOSIS — E538 Deficiency of other specified B group vitamins: Secondary | ICD-10-CM | POA: Diagnosis present

## 2020-03-29 DIAGNOSIS — C9200 Acute myeloblastic leukemia, not having achieved remission: Secondary | ICD-10-CM | POA: Diagnosis not present

## 2020-03-29 DIAGNOSIS — R1033 Periumbilical pain: Secondary | ICD-10-CM | POA: Diagnosis present

## 2020-03-29 DIAGNOSIS — D75A Glucose-6-phosphate dehydrogenase (G6PD) deficiency without anemia: Secondary | ICD-10-CM | POA: Diagnosis not present

## 2020-03-29 LAB — CBC
HCT: 31.2 % — ABNORMAL LOW (ref 39.0–52.0)
HCT: 31.5 % — ABNORMAL LOW (ref 39.0–52.0)
HCT: 32.1 % — ABNORMAL LOW (ref 39.0–52.0)
Hemoglobin: 10.5 g/dL — ABNORMAL LOW (ref 13.0–17.0)
Hemoglobin: 10.7 g/dL — ABNORMAL LOW (ref 13.0–17.0)
Hemoglobin: 10.7 g/dL — ABNORMAL LOW (ref 13.0–17.0)
MCH: 36.1 pg — ABNORMAL HIGH (ref 26.0–34.0)
MCH: 36.4 pg — ABNORMAL HIGH (ref 26.0–34.0)
MCH: 36.5 pg — ABNORMAL HIGH (ref 26.0–34.0)
MCHC: 33.3 g/dL (ref 30.0–36.0)
MCHC: 33.7 g/dL (ref 30.0–36.0)
MCHC: 34 g/dL (ref 30.0–36.0)
MCV: 107.1 fL — ABNORMAL HIGH (ref 80.0–100.0)
MCV: 108.3 fL — ABNORMAL HIGH (ref 80.0–100.0)
MCV: 108.4 fL — ABNORMAL HIGH (ref 80.0–100.0)
Platelets: 102 10*3/uL — ABNORMAL LOW (ref 150–400)
Platelets: 108 10*3/uL — ABNORMAL LOW (ref 150–400)
Platelets: 98 10*3/uL — ABNORMAL LOW (ref 150–400)
RBC: 2.88 MIL/uL — ABNORMAL LOW (ref 4.22–5.81)
RBC: 2.94 MIL/uL — ABNORMAL LOW (ref 4.22–5.81)
RBC: 2.96 MIL/uL — ABNORMAL LOW (ref 4.22–5.81)
RDW: 13.3 % (ref 11.5–15.5)
RDW: 13.5 % (ref 11.5–15.5)
RDW: 13.9 % (ref 11.5–15.5)
WBC: 0.4 10*3/uL — CL (ref 4.0–10.5)
WBC: 0.5 10*3/uL — CL (ref 4.0–10.5)
WBC: 0.7 10*3/uL — CL (ref 4.0–10.5)
nRBC: 0 % (ref 0.0–0.2)
nRBC: 0 % (ref 0.0–0.2)
nRBC: 0 % (ref 0.0–0.2)

## 2020-03-29 LAB — COMPREHENSIVE METABOLIC PANEL
ALT: 24 U/L (ref 0–44)
AST: 20 U/L (ref 15–41)
Albumin: 3.9 g/dL (ref 3.5–5.0)
Alkaline Phosphatase: 75 U/L (ref 38–126)
Anion gap: 8 (ref 5–15)
BUN: 9 mg/dL (ref 6–20)
CO2: 22 mmol/L (ref 22–32)
Calcium: 8.4 mg/dL — ABNORMAL LOW (ref 8.9–10.3)
Chloride: 100 mmol/L (ref 98–111)
Creatinine, Ser: 1.05 mg/dL (ref 0.61–1.24)
GFR calc Af Amer: >60 mL/min (ref >60)
GFR calc non Af Amer: >60 mL/min (ref >60)
Glucose, Bld: 110 mg/dL — ABNORMAL HIGH (ref 70–99)
Potassium: 3.8 mmol/L (ref 3.5–5.1)
Sodium: 130 mmol/L — ABNORMAL LOW (ref 135–145)
Total Bilirubin: 1.2 mg/dL (ref 0.3–1.2)
Total Protein: 7.5 g/dL (ref 6.5–8.1)

## 2020-03-29 LAB — HEPATIC FUNCTION PANEL
ALT: 28 U/L (ref 0–44)
AST: 23 U/L (ref 15–41)
Albumin: 4.3 g/dL (ref 3.5–5.0)
Alkaline Phosphatase: 85 U/L (ref 38–126)
Bilirubin, Direct: 0.2 mg/dL (ref 0.0–0.2)
Indirect Bilirubin: 0.7 mg/dL (ref 0.3–0.9)
Total Bilirubin: 0.9 mg/dL (ref 0.3–1.2)
Total Protein: 7.8 g/dL (ref 6.5–8.1)

## 2020-03-29 LAB — RETICULOCYTES
Immature Retic Fract: 26 % — ABNORMAL HIGH (ref 2.3–15.9)
RBC.: 2.96 MIL/uL — ABNORMAL LOW (ref 4.22–5.81)
Retic Count, Absolute: 73.4 10*3/uL (ref 19.0–186.0)
Retic Ct Pct: 2.5 % (ref 0.4–3.1)

## 2020-03-29 LAB — PHOSPHORUS: Phosphorus: 2.3 mg/dL — ABNORMAL LOW (ref 2.5–4.6)

## 2020-03-29 LAB — DIFFERENTIAL
Abs Immature Granulocytes: 0 10*3/uL (ref 0.00–0.07)
Basophils Absolute: 0 10*3/uL (ref 0.0–0.1)
Basophils Relative: 0 %
Eosinophils Absolute: 0 10*3/uL (ref 0.0–0.5)
Eosinophils Relative: 0 %
Immature Granulocytes: 0 %
Lymphocytes Relative: 64 %
Lymphs Abs: 0.3 10*3/uL — ABNORMAL LOW (ref 0.7–4.0)
Monocytes Absolute: 0 10*3/uL — ABNORMAL LOW (ref 0.1–1.0)
Monocytes Relative: 3 %
Neutro Abs: 0.1 10*3/uL — ABNORMAL LOW (ref 1.7–7.7)
Neutrophils Relative %: 33 %
Smear Review: NORMAL

## 2020-03-29 LAB — LACTIC ACID, PLASMA: Lactic Acid, Venous: 1.1 mmol/L (ref 0.5–1.9)

## 2020-03-29 LAB — FIBRIN DERIVATIVES D-DIMER (ARMC ONLY): Fibrin derivatives D-dimer (ARMC): 577.98 ng/mL (FEU) — ABNORMAL HIGH (ref 0.00–499.00)

## 2020-03-29 LAB — LACTATE DEHYDROGENASE: LDH: 162 U/L (ref 98–192)

## 2020-03-29 LAB — BASIC METABOLIC PANEL
Anion gap: 8 (ref 5–15)
BUN: 11 mg/dL (ref 6–20)
CO2: 24 mmol/L (ref 22–32)
Calcium: 8.7 mg/dL — ABNORMAL LOW (ref 8.9–10.3)
Chloride: 101 mmol/L (ref 98–111)
Creatinine, Ser: 0.96 mg/dL (ref 0.61–1.24)
GFR calc Af Amer: >60 mL/min (ref >60)
GFR calc non Af Amer: >60 mL/min (ref >60)
Glucose, Bld: 95 mg/dL (ref 70–99)
Potassium: 3.9 mmol/L (ref 3.5–5.1)
Sodium: 133 mmol/L — ABNORMAL LOW (ref 135–145)

## 2020-03-29 LAB — BLOOD CULTURE ID PANEL (REFLEXED) - BCID2

## 2020-03-29 LAB — C-REACTIVE PROTEIN: CRP: 7 mg/dL — ABNORMAL HIGH (ref <1.0)

## 2020-03-29 LAB — APTT: aPTT: 32 seconds (ref 24–36)

## 2020-03-29 LAB — PATHOLOGIST SMEAR REVIEW

## 2020-03-29 LAB — FOLATE: Folate: 9.2 ng/mL (ref >5.9)

## 2020-03-29 LAB — MAGNESIUM: Magnesium: 1.7 mg/dL (ref 1.7–2.4)

## 2020-03-29 LAB — TROPONIN I (HIGH SENSITIVITY)
Troponin I (High Sensitivity): 3 ng/L (ref <18)
Troponin I (High Sensitivity): 4 ng/L (ref <18)

## 2020-03-29 LAB — HIV ANTIBODY (ROUTINE TESTING W REFLEX): HIV Screen 4th Generation wRfx: NONREACTIVE

## 2020-03-29 LAB — IRON AND TIBC
Iron: 40 ug/dL — ABNORMAL LOW (ref 45–182)
Saturation Ratios: 12 % — ABNORMAL LOW (ref 17.9–39.5)
TIBC: 347 ug/dL (ref 250–450)
UIBC: 307 ug/dL

## 2020-03-29 LAB — CREATININE, SERUM
Creatinine, Ser: 0.84 mg/dL (ref 0.61–1.24)
GFR calc Af Amer: >60 mL/min (ref >60)
GFR calc non Af Amer: >60 mL/min (ref >60)

## 2020-03-29 LAB — SARS CORONAVIRUS 2 BY RT PCR (HOSPITAL ORDER, PERFORMED IN ~~LOC~~ HOSPITAL LAB): SARS Coronavirus 2: NEGATIVE

## 2020-03-29 LAB — LIPASE, BLOOD: Lipase: 225 U/L — ABNORMAL HIGH (ref 11–51)

## 2020-03-29 LAB — FERRITIN: Ferritin: 330 ng/mL (ref 24–336)

## 2020-03-29 LAB — PROTIME-INR
INR: 1.1 (ref 0.8–1.2)
Prothrombin Time: 13.8 seconds (ref 11.4–15.2)

## 2020-03-29 LAB — SEDIMENTATION RATE: Sed Rate: 37 mm/hr — ABNORMAL HIGH (ref 0–15)

## 2020-03-29 LAB — VITAMIN B12: Vitamin B-12: 166 pg/mL — ABNORMAL LOW (ref 180–914)

## 2020-03-29 MED ORDER — THIAMINE HCL 100 MG PO TABS
100.0000 mg | ORAL_TABLET | Freq: Every day | ORAL | Status: DC
  Administered 2020-03-29 – 2020-04-01 (×4): 100 mg via ORAL
  Filled 2020-03-29 (×4): qty 1

## 2020-03-29 MED ORDER — CYANOCOBALAMIN 1000 MCG/ML IJ SOLN
1000.0000 ug | Freq: Once | INTRAMUSCULAR | Status: AC
  Administered 2020-03-29: 11:00:00 1000 ug via INTRAMUSCULAR
  Filled 2020-03-29: qty 1

## 2020-03-29 MED ORDER — ACETAMINOPHEN 650 MG RE SUPP: 650.0000 mg | Freq: Four times a day (QID) | RECTAL | Status: DC | PRN

## 2020-03-29 MED ORDER — SODIUM CHLORIDE 0.9 % IV SOLN
2.0000 g | INTRAVENOUS | Status: DC
  Administered 2020-03-29 – 2020-03-30 (×2): 2 g via INTRAVENOUS
  Filled 2020-03-29: qty 20
  Filled 2020-03-29 (×2): qty 2

## 2020-03-29 MED ORDER — VANCOMYCIN HCL IN DEXTROSE 1-5 GM/200ML-% IV SOLN
1000.0000 mg | Freq: Three times a day (TID) | INTRAVENOUS | Status: DC
  Administered 2020-03-29: 14:00:00 1000 mg via INTRAVENOUS
  Filled 2020-03-29 (×2): qty 200

## 2020-03-29 MED ORDER — FOLIC ACID 1 MG PO TABS
1.0000 mg | ORAL_TABLET | Freq: Every day | ORAL | Status: DC
  Administered 2020-03-29 – 2020-04-01 (×4): 1 mg via ORAL
  Filled 2020-03-29 (×4): qty 1

## 2020-03-29 MED ORDER — METRONIDAZOLE IN NACL 5-0.79 MG/ML-% IV SOLN
500.0000 mg | Freq: Three times a day (TID) | INTRAVENOUS | Status: DC
  Administered 2020-03-29 – 2020-03-31 (×7): 500 mg via INTRAVENOUS
  Filled 2020-03-29 (×10): qty 100

## 2020-03-29 MED ORDER — ONDANSETRON HCL 4 MG PO TABS: 4.0000 mg | ORAL_TABLET | Freq: Four times a day (QID) | ORAL | Status: DC | PRN

## 2020-03-29 MED ORDER — METRONIDAZOLE IN NACL 5-0.79 MG/ML-% IV SOLN
500.0000 mg | Freq: Once | INTRAVENOUS | Status: AC
  Administered 2020-03-29: 500 mg via INTRAVENOUS
  Filled 2020-03-29: qty 100

## 2020-03-29 MED ORDER — ACETAMINOPHEN 325 MG PO TABS
650.0000 mg | ORAL_TABLET | Freq: Four times a day (QID) | ORAL | Status: DC | PRN
  Administered 2020-03-29 (×2): 650 mg via ORAL
  Filled 2020-03-29 (×2): qty 2

## 2020-03-29 MED ORDER — SODIUM CHLORIDE 0.9 % IV SOLN: INTRAVENOUS | Status: DC

## 2020-03-29 MED ORDER — ADULT MULTIVITAMIN W/MINERALS CH
1.0000 | ORAL_TABLET | Freq: Every day | ORAL | Status: DC
  Administered 2020-03-29 – 2020-04-01 (×4): 1 via ORAL
  Filled 2020-03-29 (×4): qty 1

## 2020-03-29 MED ORDER — MORPHINE SULFATE (PF) 2 MG/ML IV SOLN
INTRAVENOUS | Status: AC
  Filled 2020-03-29: qty 1

## 2020-03-29 MED ORDER — VANCOMYCIN HCL 2000 MG/400ML IV SOLN
2000.0000 mg | Freq: Once | INTRAVENOUS | Status: AC
  Administered 2020-03-29: 2000 mg via INTRAVENOUS
  Filled 2020-03-29: qty 400

## 2020-03-29 MED ORDER — MORPHINE SULFATE (PF) 2 MG/ML IV SOLN
2.0000 mg | INTRAVENOUS | Status: DC | PRN
  Administered 2020-03-29 – 2020-04-01 (×4): 2 mg via INTRAVENOUS
  Filled 2020-03-29 (×3): qty 1

## 2020-03-29 MED ORDER — THIAMINE HCL 100 MG/ML IJ SOLN: 100.0000 mg | Freq: Every day | INTRAMUSCULAR | Status: DC

## 2020-03-29 MED ORDER — SODIUM CHLORIDE 0.9 % IV SOLN
2.0000 g | Freq: Three times a day (TID) | INTRAVENOUS | Status: DC
  Administered 2020-03-29: 2 g via INTRAVENOUS
  Filled 2020-03-29 (×2): qty 2

## 2020-03-29 MED ORDER — ONDANSETRON HCL 4 MG/2ML IJ SOLN: 4.0000 mg | Freq: Four times a day (QID) | INTRAMUSCULAR | Status: DC | PRN

## 2020-03-29 MED ORDER — LACTATED RINGERS IV SOLN: INTRAVENOUS | Status: DC

## 2020-03-29 MED ORDER — HYDROCODONE-ACETAMINOPHEN 5-325 MG PO TABS
1.0000 | ORAL_TABLET | ORAL | Status: DC | PRN
  Administered 2020-03-29: 08:00:00 1 via ORAL
  Administered 2020-03-30 – 2020-04-01 (×6): 2 via ORAL
  Filled 2020-03-29: qty 1
  Filled 2020-03-29 (×7): qty 2

## 2020-03-29 MED ORDER — IOHEXOL 300 MG/ML  SOLN
100.0000 mL | Freq: Once | INTRAMUSCULAR | Status: AC | PRN
  Administered 2020-03-29: 100 mL via INTRAVENOUS

## 2020-03-29 MED ORDER — ACETAMINOPHEN 500 MG PO TABS
1000.0000 mg | ORAL_TABLET | Freq: Once | ORAL | Status: AC
  Administered 2020-03-29: 1000 mg via ORAL
  Filled 2020-03-29: qty 2

## 2020-03-29 MED ORDER — SODIUM CHLORIDE 0.9 % IV BOLUS
1000.0000 mL | Freq: Once | INTRAVENOUS | Status: AC
  Administered 2020-03-29: 1000 mL via INTRAVENOUS

## 2020-03-29 MED ORDER — VANCOMYCIN HCL IN DEXTROSE 1-5 GM/200ML-% IV SOLN: 1000.0000 mg | Freq: Once | INTRAVENOUS | Status: DC

## 2020-03-29 MED ORDER — SODIUM CHLORIDE 0.9 % IV SOLN
2.0000 g | Freq: Once | INTRAVENOUS | Status: AC
  Administered 2020-03-29: 2 g via INTRAVENOUS
  Filled 2020-03-29: qty 2

## 2020-03-29 NOTE — Consult Note (Signed)
[facility]  Date of admission:  03/29/2020  Inpatient day:  03/29/2020  Consulting physician: Dr Judd Gaudier  Reason for Consultation:  Neutropenic fever  Chief Complaint: Carl [name]. is a 44 y.o. male admitted through the emergency room with neutropenic fever.  HPI:  The patient denies any significant past medical history.  He denies any history of recurrent infections, transfusions, new medications or herbal products.  He denies any history of hepatitis.  He states that he drinks alcohol every other weekend from Friday through Sunday when he is not working.  He drinks both beer and liquor.  He has never had alcohol withdrawal.  He was seen by Cyndia Skeeters, NP on 11/23/2019 to establish care.  He noted intermittent left-sided flank pain.  CBC revealed neutropenia.  Hematology referral was made.  The patient states that he never saw a hematologist.  He was seen in the emergency room on 03/29/2020 with general fatigue and recent history of abdominal pain exacerbated by coughing.  He states that he takes a lot of BC powder (questionable gastritis).  When he drinks alcohol, he eats little food.  While in the emergency room, he was noted to have a temperature of 100.2.    Review of prior CBCs: 12/10/2019: Hematocrit 36.9, hemoglobin 12.5, MCV 105.1, platelets 131,000, WBC 1100 (ANC 334; ALC 713).  TSH 0.76 with a free T4 of 1.7. 03/28/2020: Hematocrit 32.1, hemoglobin 10.7, MCV 108.4, platelets   98,000,  WBC 500. 03/29/2020: Hematocrit 31.5, hemoglobin 10.7, MCV 107.1, platelets 102,000,   WBC 400 (ANC of 100; ALC 300).  Additional labs during this admission: Creatinine 0.96 on 03/28/2020.  LFTs normal on 03/29/2020.  B12 166 (low) and folate 9.2.  Ferritin 330 with an iron saturation of 12% and a TIBC of 347.  Sed rate was 37 (0-15) and CRP 7.0 (< 1.0).  Reticulocyte 2.5%.  HIV antibody was negative.  LDH was 122.  Lipase 225 on 03/29/2020.  Lactic acid was 1.1.  PT  and PTT were normal.  D-dimers were 577.98 (elevated).  COVID-19 testing negative.  Blood culture ID panel today revealed Streptococcus species.  Peripheral smear revealed pancytopenia.  Morphology of RBCs, WBCs and platelets were within normal limits.  There were no circulating blasts or schistocytes.  CXR on 03/29/2020 revealed cardiac enlargement with mild pulmonary vascular congestion and no consolidation.  Abdomen and pelvis CT scan on 03/29/2020 revealed no CT evidence for acute intra-abdominal or pelvic abnormality.  Spleen size was normal with subcentimeter hypodensity too small to further characterize.  Blood cultures were drawn.  He was started on broad-spectrum antibiotics (Cefepime, vancomycin and Flagyl).  Blood culture ID panel revealed Streptococcus species (not Enterococcus species, Streptococcus agalactiae, Streptococcus pyogenes or Streptococcus pneumonia).   History reviewed. No pertinent past medical history.  No past surgical history on file.  Family History  Problem Relation Age of Onset  . Prostate cancer Neg Hx   . Kidney cancer Neg Hx   . Bladder Cancer Neg Hx     Social History:  reports that he has been smoking cigarettes. He has a 17.00 pack-year smoking history. He has never used smokeless tobacco. He reports current alcohol use of about 6.0 standard drinks of alcohol per week. He reports that he does not use drugs.  He binge drinks alcohol every other weekend (Friday through Sunday).  He drinks vodka as well as beer.  He has smoked 2 packs a day since the age of 19.  The patient  denies any exposure to radiation or toxins.  The patient lives in [place].  He works at [employer].  He is accompanied by Dr. Steva Ready.  Allergies: No Known Allergies  No medications prior to admission.    Review of Systems: GENERAL: Fatigue.  Recent fever no sweats.  Weight gain of 4 pounds. PERFORMANCE STATUS (ECOG):  1 HEENT: Runny nose.  No visual changes, sore throat,  mouth sores or tenderness. Lungs: Smoker's cough.  No shortness of breath.  No hemoptysis. Cardiac:  No chest pain, palpitations, orthopnea, or PND. GI: Upper and mid abdominal pain.  No nausea, vomiting, diarrhea, constipation, melena or hematochezia. GU:  No urgency, frequency, dysuria, or hematuria.  History of flank pain in 10/2019. Musculoskeletal:  No back pain.  No joint pain.  No muscle tenderness. Extremities:  No pain or swelling. Skin:  No rashes or skin changes. Neuro:  No headache, numbness or weakness, balance or coordination issues. Endocrine:  No diabetes, thyroid issues, hot flashes or night sweats. Psych:  No mood changes, depression or anxiety. Pain:  No focal pain. Review of systems:  All other systems reviewed and found to be negative.  Physical Exam:  Blood pressure 126/60, pulse 79, temperature (!) 102.3 F (39.1 C), temperature source Oral, resp. rate 17, height '5\' 9"'  (1.753 m), weight 178 lb (80.7 kg), SpO2 100 %.  GENERAL:  Well developed, well nourished, gentleman sitting comfortably on the medical unit in no acute distress. MENTAL STATUS:  Alert and oriented to person, place and time. HEAD:  Short dark hair.  Mustache.  Normocephalic, atraumatic, face symmetric, no Cushingoid features. EYES:  Graeff eyes.  Pupils equal round and reactive to light and accomodation.  No conjunctivitis or scleral icterus. ENT:  Oropharynx clear without lesion.  Tongue normal.   No gingivitis.  Mucous membranes moist.  RESPIRATORY:  Clear to auscultation without rales, wheezes or rhonchi. CARDIOVASCULAR:  Regular rate and rhythm without murmur, rub or gallop. ABDOMEN:  Soft, slightly tender (upper abdomen and suprapubic area) without guarding or rebound tenderness.  Active bowel sounds and no hepatosplenomegaly.  No masses. SKIN:  No rashes, ulcers or lesions.  Tattoos. EXTREMITIES: No nail changes.  No edema, no skin discoloration or tenderness.  No palpable cords. LYMPH NODES: No  palpable cervical, supraclavicular, axillary or inguinal adenopathy  NEUROLOGICAL: Unremarkable. PSYCH:  Appropriate.   Results for orders placed or performed during the hospital encounter of 03/29/20 (from the past 48 hour(s))  Basic metabolic panel     Status: Abnormal   Collection Time: 03/28/20 11:46 PM  Result Value Ref Range   Sodium 133 (L) 135 - 145 mmol/L   Potassium 3.9 3.5 - 5.1 mmol/L   Chloride 101 98 - 111 mmol/L   CO2 24 22 - 32 mmol/L   Glucose, Bld 95 70 - 99 mg/dL    Comment: Glucose reference range applies only to samples taken after fasting for at least 8 hours.   BUN 11 6 - 20 mg/dL   Creatinine, Ser 0.96 0.61 - 1.24 mg/dL   Calcium 8.7 (L) 8.9 - 10.3 mg/dL   GFR calc non Af Amer >60 >60 mL/min   GFR calc Af Amer >60 >60 mL/min   Anion gap 8 5 - 15    Comment: Performed at [facility], [address] [place], [place] [address]  CBC     Status: Abnormal   Collection Time: 03/28/20 11:46 PM  Result Value Ref Range   WBC 0.5 (LL) 4.0 -  10.5 K/uL    Comment: This critical result has verified and been called to [facility] by [facility] on 08 03 2021 at 0053, and has been read back.  This critical result has verified and been called to [facility] by [facility] on 08 03 2021 at 0053, and has been read back.     RBC 2.96 (L) 4.22 - 5.81 MIL/uL   Hemoglobin 10.7 (L) 13.0 - 17.0 g/dL   HCT 32.1 (L) 39 - 52 %   MCV 108.4 (H) 80.0 - 100.0 fL   MCH 36.1 (H) 26.0 - 34.0 pg   MCHC 33.3 30.0 - 36.0 g/dL   RDW 13.5 11.5 - 15.5 %   Platelets 98 (L) 150 - 400 K/uL    Comment: Immature Platelet Fraction may be clinically indicated, consider ordering this additional test [id number]    nRBC 0.0 0.0 - 0.2 %    Comment: Performed at [facility], [address], [place], [place] [address]  Troponin I (High Sensitivity)     Status: None   Collection Time: 03/28/20 11:46 PM  Result Value Ref Range   Troponin I (High Sensitivity) 4  <18 ng/L    Comment: (NOTE) Elevated high sensitivity troponin I (hsTnI) values and significant  changes across serial measurements may suggest ACS but many other  chronic and acute conditions are known to elevate hsTnI results.  Refer to the "Links" section for chest pain algorithms and additional  guidance. Performed at [facility], [place], [place] [address]   Troponin I (High Sensitivity)     Status: None   Collection Time: 03/29/20  1:16 AM  Result Value Ref Range   Troponin I (High Sensitivity) 3 <18 ng/L    Comment: (NOTE) Elevated high sensitivity troponin I (hsTnI) values and significant  changes across serial measurements may suggest ACS but many other  chronic and acute conditions are known to elevate hsTnI results.  Refer to the "Links" section for chest pain algorithms and additional  guidance. Performed at [facility], [address] [place], [place] [address]   Hepatic function panel     Status: None   Collection Time: 03/29/20  1:16 AM  Result Value Ref Range   Total Protein 7.8 6.5 - 8.1 g/dL   Albumin 4.3 3.5 - 5.0 g/dL   AST 23 15 - 41 U/L   ALT 28 0 - 44 U/L   Alkaline Phosphatase 85 38 - 126 U/L   Total Bilirubin 0.9 0.3 - 1.2 mg/dL   Bilirubin, Direct 0.2 0.0 - 0.2 mg/dL   Indirect Bilirubin 0.7 0.3 - 0.9 mg/dL    Comment: Performed at [facility], [address] [place], [place] [address]  Lipase, blood     Status: Abnormal   Collection Time: 03/29/20  1:16 AM  Result Value Ref Range   Lipase 225 (H) 11 - 51 U/L    Comment: Performed at [facility], [address] [place], [place] [address]  Vitamin B12     Status: Abnormal   Collection Time: 03/29/20  1:16 AM  Result Value Ref Range   Vitamin B-12 166 (L) 180 - 914 pg/mL    Comment: (NOTE) This assay is not validated for testing neonatal or myeloproliferative syndrome specimens for Vitamin B12 levels. Performed at [facility], [place] [address], [place], [place] [address]   Folate     Status: None   Collection Time: 03/29/20  1:16 AM  Result Value Ref Range   Folate 9.2 >5.9 ng/mL    Comment: Performed at [facility], [address] [place], [place] [address]  Iron and TIBC     Status: Abnormal   Collection Time: 03/29/20  1:16 AM  Result Value Ref Range   Iron 40 (L) 45 - 182 ug/dL   TIBC 347 250 - 450 ug/dL   Saturation Ratios 12 (L) 17.9 - 39.5 %   UIBC 307 ug/dL    Comment: Performed at [facility], [address] [place], [place] [address]  Ferritin     Status: None   Collection Time: 03/29/20  1:16 AM  Result Value Ref Range   Ferritin 330 24 - 336 ng/mL    Comment: Performed at [facility] [place], [address] [place], [place] [address]  Reticulocytes     Status: Abnormal   Collection Time: 03/29/20  1:16 AM  Result Value Ref Range   Retic Ct Pct 2.5 0.4 - 3.1 %   RBC. 2.96 (L) 4.22 - 5.81 MIL/uL   Retic Count, Absolute 73.4 19.0 - 186.0 K/uL   Immature Retic Fract 26.0 (H) 2.3 - 15.9 %    Comment: Performed at [facility], [address] [place], [place] [address]  Blood culture (routine x 2)     Status: None (Preliminary result)   Collection Time: 03/29/20  1:16 AM   Specimen: BLOOD LEFT FOREARM  Result Value Ref Range   Specimen Description BLOOD LEFT FOREARM    Special Requests      BOTTLES DRAWN AEROBIC AND ANAEROBIC Blood Culture results may not be optimal due to an excessive volume of blood received in culture bottles   Culture  Setup Time      GRAM POSITIVE COCCI ANAEROBIC BOTTLE ONLY CRITICAL RESULT CALLED TO, READ BACK BY AND VERIFIED WITH: ALEX CHAPPELL AT 9528 ON 03/29/20 SNG Performed at [facility], [address] [place], [place] [address]    Culture GRAM POSITIVE COCCI    Report Status PENDING   SARS Coronavirus 2 by RT PCR (hospital order, performed in [place] hospital lab) Nasopharyngeal Nasopharyngeal Swab      Status: None   Collection Time: 03/29/20  1:16 AM   Specimen: Nasopharyngeal Swab  Result Value Ref Range   SARS Coronavirus 2 NEGATIVE NEGATIVE    Comment: (NOTE) SARS-CoV-2 target nucleic acids are NOT DETECTED.  The SARS-CoV-2 RNA is generally detectable in upper and lower respiratory specimens during the acute phase of infection. The lowest concentration of SARS-CoV-2 viral copies this assay can detect is 250 copies / mL. A negative result does not preclude SARS-CoV-2 infection and should not be used as the sole basis for treatment or other patient management decisions.  A negative result may occur with improper specimen collection / handling, submission of specimen other than nasopharyngeal swab, presence of viral mutation(s) within the areas targeted by this assay, and inadequate number of viral copies (<250 copies / mL). A negative result must be combined with clinical observations, patient history, and epidemiological information.  Fact Sheet for Patients:   StrictlyIdeas.no  Fact Sheet for Healthcare Providers: BankingDealers.co.za  This test is not yet approved or  cleared by the [place] FDA and has been authorized for detection and/or diagnosis of SARS-CoV-2 by FDA under an Emergency Use Authorization (EUA).  This EUA will remain in effect (meaning this test can be used) for the duration of the COVID-19 declaration under Section  564(b)(1) of the Act, 21 U.S.C. section 360bbb-3(b)(1), unless the authorization is terminated or revoked sooner.  Performed at [facility], [address] [place], [place] [address]   CBC     Status: Abnormal   Collection Time: 03/29/20  1:16 AM  Result Value Ref Range   WBC 0.4 (LL) 4.0 - 10.5 K/uL    Comment: This critical result has verified and been called to [facility] by Lily Peer on 08 03 2021 at 0520, and has been read back.    RBC 2.94 (L) 4.22 - 5.81 MIL/uL    Hemoglobin 10.7 (L) 13.0 - 17.0 g/dL   HCT 31.5 (L) 39 - 52 %   MCV 107.1 (H) 80.0 - 100.0 fL   MCH 36.4 (H) 26.0 - 34.0 pg   MCHC 34.0 30.0 - 36.0 g/dL   RDW 13.3 11.5 - 15.5 %   Platelets 102 (L) 150 - 400 K/uL    Comment: Immature Platelet Fraction may be clinically indicated, consider ordering this additional test [id number]    nRBC 0.0 0.0 - 0.2 %    Comment: Performed at [facility], [address] [place], [place] [address]  Differential     Status: Abnormal   Collection Time: 03/29/20  1:16 AM  Result Value Ref Range   Neutrophils Relative % 33 %   Neutro Abs 0.1 (L) 1.7 - 7.7 K/uL   Lymphocytes Relative 64 %   Lymphs Abs 0.3 (L) 0.7 - 4.0 K/uL   Monocytes Relative 3 %   Monocytes Absolute 0.0 (L) 0 - 1 K/uL   Eosinophils Relative 0 %   Eosinophils Absolute 0.0 0 - 0 K/uL   Basophils Relative 0 %   Basophils Absolute 0.0 0 - 0 K/uL   WBC Morphology MORPHOLOGY UNREMARKABLE    RBC Morphology MORPHOLOGY UNREMARKABLE    Smear Review Normal platelet morphology    Immature Granulocytes 0 %   Abs Immature Granulocytes 0.00 0.00 - 0.07 K/uL    Comment: Performed at [facility], [address] [place], [place] [address]  Pathologist smear review     Status: None   Collection Time: 03/29/20  1:16 AM  Result Value Ref Range   Path Review Blood smear is reviewed.     Comment: Patient presents with malaise and abdominal pain. Pancytopenia.  Morphology of RBCs, WBCs, and platelets within normal limits. No evidence of circulating blasts or schistocytes.  Reviewed by Elmon Kirschner, M.D. Performed at [facility], [address] [place], [place] [address]   Blood Culture ID Panel (Reflexed)     Status: Abnormal   Collection Time: 03/29/20  1:16 AM  Result Value Ref Range   Enterococcus faecalis NOT DETECTED NOT DETECTED   Enterococcus Faecium NOT DETECTED NOT DETECTED   Listeria monocytogenes NOT DETECTED NOT DETECTED   Staphylococcus  species NOT DETECTED NOT DETECTED   Staphylococcus aureus (BCID) NOT DETECTED NOT DETECTED   Staphylococcus epidermidis NOT DETECTED NOT DETECTED   Staphylococcus lugdunensis NOT DETECTED NOT DETECTED   Streptococcus species DETECTED (A) NOT DETECTED    Comment: Not Enterococcus species, Streptococcus agalactiae, Streptococcus pyogenes, or Streptococcus pneumoniae. CRITICAL RESULT CALLED TO, READ BACK BY AND VERIFIED WITH: ALEX CHAPPELL AT 1950 ON 03/29/20 SNG    Streptococcus agalactiae NOT DETECTED NOT DETECTED   Streptococcus pneumoniae NOT DETECTED NOT DETECTED   Streptococcus pyogenes NOT DETECTED NOT DETECTED   A.calcoaceticus-baumannii NOT DETECTED NOT DETECTED   Bacteroides fragilis NOT DETECTED NOT DETECTED  Enterobacterales NOT DETECTED NOT DETECTED   Enterobacter cloacae complex NOT DETECTED NOT DETECTED   Escherichia coli NOT DETECTED NOT DETECTED   Klebsiella aerogenes NOT DETECTED NOT DETECTED   Klebsiella oxytoca NOT DETECTED NOT DETECTED   Klebsiella pneumoniae NOT DETECTED NOT DETECTED   Proteus species NOT DETECTED NOT DETECTED   Salmonella species NOT DETECTED NOT DETECTED   Serratia marcescens NOT DETECTED NOT DETECTED   Haemophilus influenzae NOT DETECTED NOT DETECTED   Neisseria meningitidis NOT DETECTED NOT DETECTED   Pseudomonas aeruginosa NOT DETECTED NOT DETECTED   Stenotrophomonas maltophilia NOT DETECTED NOT DETECTED   Candida albicans NOT DETECTED NOT DETECTED   Candida auris NOT DETECTED NOT DETECTED   Candida glabrata NOT DETECTED NOT DETECTED   Candida krusei NOT DETECTED NOT DETECTED   Candida parapsilosis NOT DETECTED NOT DETECTED   Candida tropicalis NOT DETECTED NOT DETECTED   Cryptococcus neoformans/gattii NOT DETECTED NOT DETECTED    Comment: Performed at [facility], [address] [place], [place] [address]  Blood culture (routine x 2)     Status: None (Preliminary result)   Collection Time: 03/29/20  1:17 AM   Specimen: BLOOD  RIGHT FOREARM  Result Value Ref Range   Specimen Description BLOOD RIGHT FOREARM    Special Requests      BOTTLES DRAWN AEROBIC AND ANAEROBIC Blood Culture results may not be optimal due to an excessive volume of blood received in culture bottles   Culture      NO GROWTH < 12 HOURS Performed at [facility], [address] [place], [place] [address]    Report Status PENDING   Lactic acid, plasma     Status: None   Collection Time: 03/29/20  1:23 AM  Result Value Ref Range   Lactic Acid, Venous 1.1 0.5 - 1.9 mmol/L    Comment: Performed at [facility], [address] [place], [place] [address]  HIV Antibody (routine testing w rflx)     Status: None   Collection Time: 03/29/20  4:01 AM  Result Value Ref Range   HIV Screen 4th Generation wRfx Non Reactive Non Reactive    Comment: Performed at [facility], [address] [place], [place] [address]  ESR     Status: Abnormal   Collection Time: 03/29/20  8:26 AM  Result Value Ref Range   Sed Rate 37 (H) 0 - 15 mm/hr    Comment: Performed at [facility], [address] [place], [place] [address]  C-reactive protein     Status: Abnormal   Collection Time: 03/29/20  8:26 AM  Result Value Ref Range   CRP 7.0 (H) <1.0 mg/dL    Comment: Performed at [facility], [place] [address] [place], [place] [address]  Lactate dehydrogenase     Status: None   Collection Time: 03/29/20  8:26 AM  Result Value Ref Range   LDH 162 98 - 192 U/L    Comment: Performed at [facility], [address] [place], [place] [address]  Protime-INR     Status: None   Collection Time: 03/29/20  8:26 AM  Result Value Ref Range   Prothrombin Time 13.8 11.4 - 15.2 seconds   INR 1.1 0.8 - 1.2    Comment: (NOTE) INR goal varies based on device and disease states. Performed at [facility], [address] [place], [place] [address]   APTT     Status: None   Collection Time: 03/29/20  8:26 AM  Result  Value Ref Range   aPTT 32 24 - 36 seconds    Comment: Performed at [facility], [address] [place], [place] [address]  Fibrin derivatives D-Dimer [facility] only)     Status: Abnormal   Collection Time: 03/29/20  8:26 AM  Result Value Ref Range   Fibrin derivatives D-dimer ([facility]) 577.98 (H) 0.00 - 499.00 ng/mL (FEU)    Comment: (NOTE) <> Exclusion of Venous Thromboembolism (VTE) - OUTPATIENT ONLY   (Emergency Department or [place])    0-499 ng/ml (FEU): With a low to intermediate pretest probability                      for VTE this test result excludes the diagnosis                      of VTE.   >499 ng/ml (FEU) : VTE not excluded; additional work up for VTE is                      required.  <> Testing on Inpatients and Evaluation of Disseminated Intravascular   Coagulation (DIC) Reference Range:   0-499 ng/ml (FEU) Performed at [facility], [address] [place], [place] [address]   Creatinine, serum     Status: None   Collection Time: 03/29/20  8:26 AM  Result Value Ref Range   Creatinine, Ser 0.84 0.61 - 1.24 mg/dL   GFR calc non Af Amer >60 >60 mL/min   GFR calc Af Amer >60 >60 mL/min    Comment: Performed at [facility], [address] [place], [place] [address]  Comprehensive metabolic panel     Status: Abnormal   Collection Time: 03/29/20 12:30 PM  Result Value Ref Range   Sodium 130 (L) 135 - 145 mmol/L   Potassium 3.8 3.5 - 5.1 mmol/L   Chloride 100 98 - 111 mmol/L   CO2 22 22 - 32 mmol/L   Glucose, Bld 110 (H) 70 - 99 mg/dL    Comment: Glucose reference range applies only to samples taken after fasting for at least 8 hours.   BUN 9 6 - 20 mg/dL   Creatinine, Ser 1.05 0.61 - 1.24 mg/dL   Calcium 8.4 (L) 8.9 - 10.3 mg/dL   Total Protein 7.5 6.5 - 8.1 g/dL   Albumin 3.9 3.5 - 5.0 g/dL   AST 20 15 - 41 U/L   ALT 24 0 - 44 U/L   Alkaline Phosphatase 75 38 - 126 U/L   Total Bilirubin 1.2 0.3 - 1.2 mg/dL   GFR calc non Af Amer  >60 >60 mL/min   GFR calc Af Amer >60 >60 mL/min   Anion gap 8 5 - 15    Comment: Performed at [facility], [address] [place], [place] [address]  Magnesium     Status: None   Collection Time: 03/29/20 12:30 PM  Result Value Ref Range   Magnesium 1.7 1.7 - 2.4 mg/dL    Comment: Performed at [facility], [address] [place], [place] [address]  Phosphorus     Status: Abnormal   Collection Time: 03/29/20 12:30 PM  Result Value Ref Range   Phosphorus 2.3 (L) 2.5 - 4.6 mg/dL    Comment: Performed at [facility], [address] [place], [place] [address]  CBC     Status: Abnormal   Collection Time: 03/29/20 12:30  PM  Result Value Ref Range   WBC 0.7 (LL) 4.0 - 10.5 K/uL    Comment: This critical result has verified and been called to [facility] by Randalyn Rhea on 08 03 2021 at 1246, and has been read back.    RBC 2.88 (L) 4.22 - 5.81 MIL/uL   Hemoglobin 10.5 (L) 13.0 - 17.0 g/dL   HCT 31.2 (L) 39 - 52 %   MCV 108.3 (H) 80.0 - 100.0 fL   MCH 36.5 (H) 26.0 - 34.0 pg   MCHC 33.7 30.0 - 36.0 g/dL   RDW 13.9 11.5 - 15.5 %   Platelets 108 (L) 150 - 400 K/uL    Comment: Immature Platelet Fraction may be clinically indicated, consider ordering this additional test [id number]    nRBC 0.0 0.0 - 0.2 %    Comment: Performed at [facility], [address] [place], [place] [address]   CT ABDOMEN PELVIS W CONTRAST  Result Date: 03/29/2020 CLINICAL DATA:  Abdomen pain with fever EXAM: CT ABDOMEN AND PELVIS WITH CONTRAST TECHNIQUE: Multidetector CT imaging of the abdomen and pelvis was performed using the standard protocol following bolus administration of intravenous contrast. CONTRAST:  169m OMNIPAQUE IOHEXOL 300 MG/ML  SOLN COMPARISON:  Chest x-ray 03/29/2020 FINDINGS: Lower chest: Lung bases demonstrate no acute consolidation or pleural effusion. Borderline cardiomegaly. Hepatobiliary: No focal liver abnormality is seen. No gallstones,  gallbladder wall thickening, or biliary dilatation. Pancreas: Unremarkable. No pancreatic ductal dilatation or surrounding inflammatory changes. Spleen: Subcentimeter hypodensity too small to further characterize. Adrenals/Urinary Tract: Adrenal glands are normal. Subcentimeter hypodense lesions in both kidneys too small to further characterize. No hydronephrosis. The bladder is normal Stomach/Bowel: Stomach is within normal limits. Appendix appears normal. No evidence of bowel wall thickening, distention, or inflammatory changes. Vascular/Lymphatic: No significant vascular findings are present. No enlarged abdominal or pelvic lymph nodes. Reproductive: Prostate is slightly enlarged Other: Negative for free air or free fluid. Fat within the bilateral inguinal canal Musculoskeletal: No acute or significant osseous findings. IMPRESSION: No CT evidence for acute intra-abdominal or pelvic abnormality. Electronically Signed   By: KDonavan FoilM.D.   On: 03/29/2020 03:26   DG Chest Portable 1 View  Result Date: 03/29/2020 CLINICAL DATA:  Sepsis.  Abdominal pain.  Cough.  Smoker. EXAM: PORTABLE CHEST 1 VIEW COMPARISON:  None. FINDINGS: Shallow inspiration. Mild cardiac enlargement with some pulmonary vascular congestion. No focal consolidation or edema. No pleural effusions. No pneumothorax. Mediastinal contours appear intact. IMPRESSION: Cardiac enlargement with mild pulmonary vascular congestion. No edema or consolidation. Electronically Signed   By: WLucienne CapersM.D.   On: 03/29/2020 01:42    Assessment:  The patient is a 44y.o. gentleman with pancytopenia admitted with fever and abdominal pain.  He binge drinks from Friday-Sunday every other week.  He denies any new medications or herbal products.  He denies recurrent infections or gingivitis.  Normal labs include:  creatinine, LFTs, folate, ferritin with iron studies, HIV antibody, LDH, lactic acid, PT, PTT, HIV antibody, and COVID-19 testing.    Peripheral smear revealed pancytopenia.  Morphology of RBCs, WBCs and platelets were within normal limits.  There were no circulating blasts or schistocytes.  He has B12 deficiency.  B12 was 166 (low) and folate 9.2.    Blood ID panel reveals streptococcus.  He is on ceftriaxone and Flagyl.  Plan:   1.   Pancytopenia  Hematocrit 31.2.  Hemoglobin 10.5.  MCV 108.3.  Platelets 108,000.  WBC 700.  Differential  includes: nutritional abnormalities, marrow suppression, MDS, marrow infiltrative process.   Current work-up confirms B12 deficiency.    Labs ordered to r/o pernicious anemia.    B12 IM weekly x 4 then monthly.   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  Suspect some component of thrombocytopenia and leukopenia due to current infection/sepsis.  Check:  hepatitis B and C serologies, ANA with reflex, zinc.  Follow-up pending copper level.  No medications implicated.      No evidence of DIC.  No splenomegaly.  Some component of anemia possibly related to gastritis (alcohol and BC powder).  Discuss consideration of bone marrow if counts do not improve off alcohol and with correction of B12 deficiency. 2.   Fever and neutropenia  Patient on broad spectrum antibiotics (Cefepime and vancomycin then ceftriaxone and Flagyl).  Blood culture ID suggests streptococcus species (? GI source).  Blood culture q 24 hours prn temp >= 100.4.  Fever and neutropenia precautions.  Appreciate ID consult.  Thank you for allowing me to participate in Rodricus Deronte Solis. 's care.  I will follow him closely with you while hospitalized and after discharge in the outpatient department.   Lequita Asal, MD  03/29/2020, 4:19 PM

## 2020-03-29 NOTE — Progress Notes (Signed)
No charge progress note.  Carl Irvan Tiedt. is a 44 y.o. male with no significant past medical history but who 3 months prior was noted to have WBC of 1100 and was referred to hematology but was unable to, who presents to the emergency room with a 1 month complaint of generalized malaise and a 1 week history of nonproductive cough abdominal pain mostly when coughing.  Reports a low-grade temperature but no chills. Presentation he was found to be severely neutropenic, WBCs of 500 and he was febrile at 101.4  Patient has pancytopenia with BCs of 0.4, hemoglobin of 10.7 and platelets of 102 this morning.  Patient has not seen a physician for a long time except couple of month ago when he was asked to see a hematologist due to WBCs of 1100.  Denies any frequent infections. Initial labs with mildly elevated ESR, CRP, D-dimer at 577, B12 of 166, iron of 40 with normal TIBC, HIV antibody negative.  Lipase at 225.  CT abdomen without any acute changes. Patient was started on cefepime and vancomycin.  When I ask patient to binge drinking over weekends, he was drinking continuously from Thursday till Sunday.  Was complaining of abdominal pain with quite tenderness involving epigastrium and left upper quadrant region. Patient denies any withdrawal before stating that he does not drink on a daily basis just on weekends.  His symptoms and elevated lipase are concerning for acute pancreatitis most likely secondary to his alcohol use.  He also has vitamin B12 deficiency.  Vitamin B12 and copper deficiencies can also cause neutropenia.  Patient has macrocytic anemia which is consistent with B12 deficiency.  Was within normal limit but we will check RBC folate, MMA and homocystine levels to differentiate. If true vitamin B-12 deficiency he might need work-up for pernicious anemia. Also ordered LDH, copper, and EBV which results are still pending. -Give him one-time dose of B12 IM, will start p.o. supplement, might  need IM supplement if shows true vitamin B12 deficiency and pernicious anemia work-up is positive.   There is no obvious source of infection, in order to cover him from anaerobe due to possible intra-abdominal infection with abdominal pain and tenderness, Flagyl was added. CIWA protocol without Ativan at this time-can add Ativan if started withdrawing.  Hematology was consulted by admitting provider-we will appreciate their recommendations.

## 2020-03-29 NOTE — ED Provider Notes (Signed)
[facility] Emergency Department Provider Note    First MD Initiated Contact with Patient 03/29/20 0105     (approximate)  I have reviewed the triage vital signs and the nursing notes.   HISTORY  Chief Complaint Abdominal Pain    HPI Carl [name]. is a 44 y.o. male no significant past medical history presents to the ER for evaluation of cough generalized malaise as well as abdominal pain anytime he coughs.  Symptoms have been ongoing for the past several days.  Does not have a PCP.  On review of records patient had evidence of significant leukopenia and was supposed to have follow-up with hematology but was unable to do so.  Primary complaint today is abdominal pain fever and chills.    History reviewed. No pertinent past medical history. Family History  Problem Relation Age of Onset  . Prostate cancer Neg Hx   . Kidney cancer Neg Hx   . Bladder Cancer Neg Hx    No past surgical history on file. Patient Active Problem List   Diagnosis Date Noted  . Flank pain 11/23/2019  . Low back pain 11/23/2019  . Encounter to establish care with new doctor 11/23/2019      Prior to Admission medications   Not on File    Allergies Patient has no known allergies.    Social History Social History   Tobacco Use  . Smoking status: Current Every Day Smoker    Packs/day: 1.00    Years: 17.00    Pack years: 17.00    Types: Cigarettes  . Smokeless tobacco: Never Used  Vaping Use  . Vaping Use: Never used  Substance Use Topics  . Alcohol use: Yes    Alcohol/week: 6.0 standard drinks    Types: 6 Cans of beer per week    Comment: 6-24oz beers on weekly bases  . Drug use: No    Review of Systems Patient denies headaches, rhinorrhea, blurry vision, numbness, shortness of breath, chest pain, edema, cough, abdominal pain, nausea, vomiting, diarrhea, dysuria, fevers, rashes or hallucinations unless otherwise stated above in  HPI. ____________________________________________   PHYSICAL EXAM:  VITAL SIGNS: Vitals:   03/28/20 2333 03/29/20 0219  BP: 132/72 122/73  Pulse: 90 83  Resp: 18 20  Temp: 100.2 F (37.9 C) (!) 101.4 F (38.6 C)  SpO2: 100% 99%    Constitutional: Alert and oriented.  Eyes: Conjunctivae are normal.  Head: Atraumatic. Nose: No congestion/rhinnorhea. Mouth/Throat: Mucous membranes are moist.   Neck: No stridor. Painless ROM.  Cardiovascular: Normal rate, regular rhythm. Grossly normal heart sounds.  Good peripheral circulation. Respiratory: Normal respiratory effort.  No retractions. Lungs CTAB. Gastrointestinal: Soft and nontender. No distention. No abdominal bruits. No CVA tenderness. Genitourinary:  Musculoskeletal: No lower extremity tenderness nor edema.  No joint effusions. Neurologic:  Normal speech and language. No gross focal neurologic deficits are appreciated. No facial droop Skin:  Skin is warm, dry and intact. No rash noted. Psychiatric: Mood and affect are normal. Speech and behavior are normal.  ____________________________________________   LABS (all labs ordered are listed, but only abnormal results are displayed)  Results for orders placed or performed during the hospital encounter of 03/29/20 (from the past 24 hour(s))  Basic metabolic panel     Status: Abnormal   Collection Time: 03/28/20 11:46 PM  Result Value Ref Range   Sodium 133 (L) 135 - 145 mmol/L   Potassium 3.9 3.5 - 5.1 mmol/L   Chloride 101 98 -  111 mmol/L   CO2 24 22 - 32 mmol/L   Glucose, Bld 95 70 - 99 mg/dL   BUN 11 6 - 20 mg/dL   Creatinine, Ser 0.96 0.61 - 1.24 mg/dL   Calcium 8.7 (L) 8.9 - 10.3 mg/dL   GFR calc non Af Amer >60 >60 mL/min   GFR calc Af Amer >60 >60 mL/min   Anion gap 8 5 - 15  CBC     Status: Abnormal   Collection Time: 03/28/20 11:46 PM  Result Value Ref Range   WBC 0.5 (LL) 4.0 - 10.5 K/uL   RBC 2.96 (L) 4.22 - 5.81 MIL/uL   Hemoglobin 10.7 (L) 13.0 - 17.0  g/dL   HCT 32.1 (L) 39 - 52 %   MCV 108.4 (H) 80.0 - 100.0 fL   MCH 36.1 (H) 26.0 - 34.0 pg   MCHC 33.3 30.0 - 36.0 g/dL   RDW 13.5 11.5 - 15.5 %   Platelets 98 (L) 150 - 400 K/uL   nRBC 0.0 0.0 - 0.2 %  Troponin I (High Sensitivity)     Status: None   Collection Time: 03/28/20 11:46 PM  Result Value Ref Range   Troponin I (High Sensitivity) 4 <18 ng/L  Troponin I (High Sensitivity)     Status: None   Collection Time: 03/29/20  1:16 AM  Result Value Ref Range   Troponin I (High Sensitivity) 3 <18 ng/L  Hepatic function panel     Status: None   Collection Time: 03/29/20  1:16 AM  Result Value Ref Range   Total Protein 7.8 6.5 - 8.1 g/dL   Albumin 4.3 3.5 - 5.0 g/dL   AST 23 15 - 41 U/L   ALT 28 0 - 44 U/L   Alkaline Phosphatase 85 38 - 126 U/L   Total Bilirubin 0.9 0.3 - 1.2 mg/dL   Bilirubin, Direct 0.2 0.0 - 0.2 mg/dL   Indirect Bilirubin 0.7 0.3 - 0.9 mg/dL  Lipase, blood     Status: Abnormal   Collection Time: 03/29/20  1:16 AM  Result Value Ref Range   Lipase 225 (H) 11 - 51 U/L  Folate     Status: None   Collection Time: 03/29/20  1:16 AM  Result Value Ref Range   Folate 9.2 >5.9 ng/mL  Iron and TIBC     Status: Abnormal   Collection Time: 03/29/20  1:16 AM  Result Value Ref Range   Iron 40 (L) 45 - 182 ug/dL   TIBC 347 250 - 450 ug/dL   Saturation Ratios 12 (L) 17.9 - 39.5 %   UIBC 307 ug/dL  Ferritin     Status: None   Collection Time: 03/29/20  1:16 AM  Result Value Ref Range   Ferritin 330 24 - 336 ng/mL  Reticulocytes     Status: Abnormal   Collection Time: 03/29/20  1:16 AM  Result Value Ref Range   Retic Ct Pct 2.5 0.4 - 3.1 %   RBC. 2.96 (L) 4.22 - 5.81 MIL/uL   Retic Count, Absolute 73.4 19.0 - 186.0 K/uL   Immature Retic Fract 26.0 (H) 2.3 - 15.9 %  SARS Coronavirus 2 by RT PCR (hospital order, performed in [place] hospital lab) Nasopharyngeal Nasopharyngeal Swab     Status: None   Collection Time: 03/29/20  1:16 AM   Specimen: Nasopharyngeal  Swab  Result Value Ref Range   SARS Coronavirus 2 NEGATIVE NEGATIVE   ____________________________________________  EKG My review and personal interpretation  at Time: 23:36   Indication: abd pain  Rate: 95  Rhythm: sinus Axis: normal Other: normal intervals, nonspecific st abn ____________________________________________  RADIOLOGY  I personally reviewed all radiographic images ordered to evaluate for the above acute complaints and reviewed radiology reports and findings.  These findings were personally discussed with the patient.  Please see medical record for radiology report.  ____________________________________________   PROCEDURES  Procedure(s) performed:  .Critical Care Performed by: Merlyn Lot, MD Authorized by: Merlyn Lot, MD   Critical care provider statement:    Critical care time (minutes):  35   Critical care time was exclusive of:  Separately billable procedures and treating other patients   Critical care was necessary to treat or prevent imminent or life-threatening deterioration of the following conditions:  Sepsis   Critical care was time spent personally by me on the following activities:  Development of treatment plan with patient or surrogate, discussions with consultants, evaluation of patient's response to treatment, examination of patient, obtaining history from patient or surrogate, ordering and performing treatments and interventions, ordering and review of laboratory studies, ordering and review of radiographic studies, pulse oximetry, re-evaluation of patient's condition and review of old charts      Critical Care performed: yes ____________________________________________   INITIAL IMPRESSION / [place] / ED COURSE  Pertinent labs & imaging results that were available during my care of the patient were reviewed by me and considered in my medical decision making (see chart for details).   DDX: Sepsis, electrolyte  abnormality, malignancy, appendicitis, diverticulitis, abscess, mass, pancreatitis  Azion Djimon Lundstrom. is a 44 y.o. who presents to the ED with symptoms as described above.  Patient nontoxic-appearing but has evidence of significant leukopenia and low-grade temperature.  Will check core temperature.  Concern for possible underlying sepsis.  Possible malignancy.  Will give IV hydration.  Given his abdominal pain will order CT imaging.  The patient will be placed on continuous pulse oximetry and telemetry for monitoring.  Laboratory evaluation will be sent to evaluate for the above complaints.     Clinical Course as of Mar 30 351  Tue Mar 29, 2020  0230 [name] core temperature is elevated.  Will initiate sepsis protocol will give IV fluids but will give judicious resuscitation pending lactate as he is normotensive no tachycardia and chest x-ray does show signs are concerning for possible pulmonary vascular congestion.   [PR]    Clinical Course User Index [PR] Merlyn Lot, MD    The patient was evaluated in Emergency Department today for the symptoms described in the history of present illness. He/she was evaluated in the context of the global COVID-19 pandemic, which necessitated consideration that the patient might be at risk for infection with the SARS-CoV-2 virus that causes COVID-19. Institutional protocols and algorithms that pertain to the evaluation of patients at risk for COVID-19 are in a state of rapid change based on information released by regulatory bodies including the CDC and federal and state organizations. These policies and algorithms were followed during the patient's care in the ED.  As part of my medical decision making, I reviewed the following data within the [place] notes reviewed and incorporated, Labs reviewed, notes from prior ED visits and [place] Controlled Substance Database   ____________________________________________   FINAL  CLINICAL IMPRESSION(S) / ED DIAGNOSES  Final diagnoses:  Sepsis, due to unspecified organism, unspecified whether acute organ dysfunction present ([place])      NEW MEDICATIONS STARTED DURING THIS  VISIT:  New Prescriptions   No medications on file     Note:  This document was prepared using Dragon voice recognition software and may include unintentional dictation errors.    Merlyn Lot, MD 03/29/20 [id number]

## 2020-03-29 NOTE — H&P (Signed)
History and Physical    Carl [name]. UUV:[id number] DOB: [birth year] DOA: 03/29/2020  PCP: Patient, No Pcp Per   Patient coming from: home  I have personally briefly reviewed patient's old medical records in [place]  Chief Complaint: Fever, weakness, abdominal pain  HPI: Carl [name]. is a 44 y.o. male with no significant past medical history but who 3 months prior was noted to have WBC of 1100 and was referred to hematology but was unable to, who presents to the emergency room with a 1 month complaint of generalized malaise and a 1 week history of nonproductive cough abdominal pain mostly when coughing.  Reports a low-grade temperature but no chills.  Denies nausea vomiting or change in bowel habits.  Denies chest pains or shortness of breath. ED Course: On arrival he had a low-grade temperature of 100.2 with borderline tachycardia of 99 otherwise normal vitals.  Blood work notable for WBC of 500(neutrophil count pending), platelets 98,000 and hemoglobin 10.7.  Lactic acid 1.1.  RBCs decreased at 2.96 with increased immature reticulocyte fraction of 26%.  Reticulocyte percent normal at 2.5.  Iron diminished at 40.  Lipase 225.CT chest showed mild pulmonary vascular congestion, with no edema or consolidation.  CT abdomen and pelvis showed no CT evidence for acute intra-abdominal or pelvic abnormality.  Patient was started on broad-spectrum antibiotics with cefepime, Flagyl and vancomycin.  Hospitalist consulted for admission.  Review of Systems: As per HPI otherwise all other systems on review of systems negative.    History reviewed. No pertinent past medical history.  No past surgical history on file.   reports that he has been smoking cigarettes. He has a 17.00 pack-year smoking history. He has never used smokeless tobacco. He reports current alcohol use of about 6.0 standard drinks of alcohol per week. He reports that he does not use drugs.  No Known Allergies  Family  History  Problem Relation Age of Onset  . Prostate cancer Neg Hx   . Kidney cancer Neg Hx   . Bladder Cancer Neg Hx       Prior to Admission medications   Not on File    Physical Exam: Vitals:   03/28/20 2333 03/29/20 0219  BP: 132/72 122/73  Pulse: 90 83  Resp: 18 20  Temp: 100.2 F (37.9 C) (!) 101.4 F (38.6 C)  TempSrc: Oral Rectal  SpO2: 100% 99%  Weight: 80.7 kg   Height: 5\' 9"  (1.753 m)      Vitals:   03/28/20 2333 03/29/20 0219  BP: 132/72 122/73  Pulse: 90 83  Resp: 18 20  Temp: 100.2 F (37.9 C) (!) 101.4 F (38.6 C)  TempSrc: Oral Rectal  SpO2: 100% 99%  Weight: 80.7 kg   Height: 5\' 9"  (1.753 m)       Constitutional: Alert and oriented x 3 . Not in any apparent distress HEENT:      Head: Normocephalic and atraumatic.         Eyes: PERLA, EOMI, Conjunctivae are normal. Sclera is non-icteric.       Mouth/Throat: Mucous membranes are moist.       Neck: Supple with no signs of meningismus. Cardiovascular: Regular rate and rhythm. No murmurs, gallops, or rubs. 2+ symmetrical distal pulses are present . No JVD. No LE edema Respiratory: Respiratory effort normal .Lungs sounds clear bilaterally. No wheezes, crackles, or rhonchi.  Gastrointestinal: Soft, tender on superficial palpation, and non distended with positive bowel sounds. No rebound or guarding. Genitourinary:  No CVA tenderness. Musculoskeletal: Nontender with normal range of motion in all extremities. No cyanosis, or erythema of extremities. Neurologic: Normal speech and language. Face is symmetric. Moving all extremities. No gross focal neurologic deficits . Skin: Skin is warm, dry.  No rash or ulcers Psychiatric: Mood and affect are normal Speech and behavior are normal   Labs on Admission: I have personally reviewed following labs and imaging studies  CBC: Recent Labs  Lab 03/28/20 2346  WBC 0.5*  HGB 10.7*  HCT 32.1*  MCV 108.4*  PLT 98*   Basic Metabolic Panel: Recent Labs    Lab 03/28/20 2346  NA 133*  K 3.9  CL 101  CO2 24  GLUCOSE 95  BUN 11  CREATININE 0.96  CALCIUM 8.7*   GFR: Estimated Creatinine Clearance: 98.2 mL/min (by C-G formula based on SCr of 0.96 mg/dL). Liver Function Tests: Recent Labs  Lab 03/29/20 0116  AST 23  ALT 28  ALKPHOS 85  BILITOT 0.9  PROT 7.8  ALBUMIN 4.3   Recent Labs  Lab 03/29/20 0116  LIPASE 225*   No results for input(s): AMMONIA in the last 168 hours. Coagulation Profile: No results for input(s): INR, PROTIME in the last 168 hours. Cardiac Enzymes: No results for input(s): CKTOTAL, CKMB, CKMBINDEX, TROPONINI in the last 168 hours. BNP (last 3 results) No results for input(s): PROBNP in the last 8760 hours. HbA1C: No results for input(s): HGBA1C in the last 72 hours. CBG: No results for input(s): GLUCAP in the last 168 hours. Lipid Profile: No results for input(s): CHOL, HDL, LDLCALC, TRIG, CHOLHDL, LDLDIRECT in the last 72 hours. Thyroid Function Tests: No results for input(s): TSH, T4TOTAL, FREET4, T3FREE, THYROIDAB in the last 72 hours. Anemia Panel: Recent Labs    03/29/20 0116  FOLATE 9.2  FERRITIN 330  TIBC 347  IRON 40*  RETICCTPCT 2.5   Urine analysis:    Component Value Date/Time   COLORURINE YELLOW 12/10/2019 0859   APPEARANCEUR CLEAR 12/10/2019 0859   LABSPEC 1.016 12/10/2019 0859   PHURINE 7.5 12/10/2019 0859   GLUCOSEU NEGATIVE 12/10/2019 0859   HGBUR NEGATIVE 12/10/2019 0859   BILIRUBINUR small 11/23/2019 [place] 12/10/2019 0859   PROTEINUR NEGATIVE 12/10/2019 0859   UROBILINOGEN 0.2 11/23/2019 1457   NITRITE NEGATIVE 12/10/2019 0859   LEUKOCYTESUR NEGATIVE 12/10/2019 0859    Radiological Exams on Admission: CT ABDOMEN PELVIS W CONTRAST  Result Date: 03/29/2020 CLINICAL DATA:  Abdomen pain with fever EXAM: CT ABDOMEN AND PELVIS WITH CONTRAST TECHNIQUE: Multidetector CT imaging of the abdomen and pelvis was performed using the standard protocol  following bolus administration of intravenous contrast. CONTRAST:  1108mL OMNIPAQUE IOHEXOL 300 MG/ML  SOLN COMPARISON:  Chest x-ray 03/29/2020 FINDINGS: Lower chest: Lung bases demonstrate no acute consolidation or pleural effusion. Borderline cardiomegaly. Hepatobiliary: No focal liver abnormality is seen. No gallstones, gallbladder wall thickening, or biliary dilatation. Pancreas: Unremarkable. No pancreatic ductal dilatation or surrounding inflammatory changes. Spleen: Subcentimeter hypodensity too small to further characterize. Adrenals/Urinary Tract: Adrenal glands are normal. Subcentimeter hypodense lesions in both kidneys too small to further characterize. No hydronephrosis. The bladder is normal Stomach/Bowel: Stomach is within normal limits. Appendix appears normal. No evidence of bowel wall thickening, distention, or inflammatory changes. Vascular/Lymphatic: No significant vascular findings are present. No enlarged abdominal or pelvic lymph nodes. Reproductive: Prostate is slightly enlarged Other: Negative for free air or free fluid. Fat within the bilateral inguinal canal Musculoskeletal: No acute or significant osseous findings. IMPRESSION: No CT  evidence for acute intra-abdominal or pelvic abnormality. Electronically Signed   By: Donavan Foil M.D.   On: 03/29/2020 03:26   DG Chest Portable 1 View  Result Date: 03/29/2020 CLINICAL DATA:  Sepsis.  Abdominal pain.  Cough.  Smoker. EXAM: PORTABLE CHEST 1 VIEW COMPARISON:  None. FINDINGS: Shallow inspiration. Mild cardiac enlargement with some pulmonary vascular congestion. No focal consolidation or edema. No pleural effusions. No pneumothorax. Mediastinal contours appear intact. IMPRESSION: Cardiac enlargement with mild pulmonary vascular congestion. No edema or consolidation. Electronically Signed   By: Lucienne Capers M.D.   On: 03/29/2020 01:42    EKG: Independently reviewed. Interpretation : Normal sinus rhythm with no acute ST-T wave  changes  Assessment/Plan 44 year old male with no significant past medical history presenting with generalized weakness, cough and fever.  Work-up showing pancytopenia with marked leukopenia, WBC 500.  Lipase elevated at 225    Neutropenic fever (HCC) -WBC 500, differential still pending so unable to calculate absolute neutrophil count at this time -No obvious source of infection.  Chest x-ray with mild pulmonary vascular congestion, CT abdomen and pelvis unremarkable.  Urinalysis pending.  Covid negative.  No localizing signs of infection -Broad-spectrum antibiotics with cefepime and vancomycin -Mild hydration -Neutropenic precautions -Hematology consult    Pancytopenia (HCC) -Hemoglobin 10.7, platelets 98,000 and WBC 500 as above -Iron 40 -Hematology consult as above.  Further work-up    Pancreatitis -Patient complains of abdominal pain on coughing -Clear liquid diet -CT abdomen and pelvis with no abnormal findings    DVT prophylaxis: SCDs Code Status: full code  Family Communication:  none  Disposition Plan: Back to previous home environment Consults called: Hematology Status:At the time of admission, it appears that the appropriate admission status for this patient is INPATIENT. This is judged to be reasonable and necessary in order to provide the required intensity of service to ensure the patient's safety given the presenting symptoms, physical exam findings, and initial radiographic and laboratory data in the context of their  Comorbid conditions.   Patient requires inpatient status due to high intensity of service, high risk for further deterioration and high frequency of surveillance required.   I certify that at the point of admission it is my clinical judgment that the patient will require inpatient hospital care spanning beyond [place] MD [facility]     03/29/2020, 4:21 AM

## 2020-03-29 NOTE — Progress Notes (Signed)
Pharmacy Antibiotic Note  Carl [name]. is a 44 y.o. male admitted on 03/29/2020 with febrile neutropenia.  Pharmacy has been consulted for Vancomycin dosing.  Plan: Vancomycin 2 gm IV X 1 given on 8/3 @ 0430. Vancomycin 1 gm IV Q8H ordered to start on 8/3 @ 1230. No trough currently ordered.   Height: 5\' 9"  (175.3 cm) Weight: 80.7 kg (178 lb) IBW/kg (Calculated) : 70.7  Temp (24hrs), Avg:100.8 F (38.2 C), Min:100.2 F (37.9 C), Max:101.4 F (38.6 C)  Recent Labs  Lab 03/28/20 2346 03/29/20 0123  WBC 0.5*  --   CREATININE 0.96  --   LATICACIDVEN  --  1.1    Estimated Creatinine Clearance: 98.2 mL/min (by C-G formula based on SCr of 0.96 mg/dL).    No Known Allergies  Antimicrobials this admission:   >>    >>   Dose adjustments this admission:   Microbiology results:  BCx:   UCx:    Sputum:    MRSA PCR:   Thank you for allowing pharmacy to be a part of this patient's care.  Jajaira Ruis D 03/29/2020 4:35 AM

## 2020-03-29 NOTE — Consult Note (Signed)
NAME: Carl [name].  DOB: 1976-05-11  MRN: [id number]  Date/Time: 03/29/2020 4:50 PM  REQUESTING PROVIDER: Dr.Amin Subjective:  REASON FOR CONSULT: bacteremia ? Carl [name]. is a 44 y.o. with no significant medical history Admitted with chills, abdominal pain of 1 day duration Pt in March 2021 went to his PCP for left flank pain she did lab work which showed a wbc of 1.1/Hb 12.5/PLT 131. He was asked to follow up with hem but did not. He was at his girlfriend's mom 's place when he developed sudden chills and also felt pain in the abdomen- He says it was diffuse. He was brought to the ED and vitals were 100.2, BP 132/72, HR 90. Labs revealed WBC of 0.5/Hb 10.7/MCV 108.4/PLT 98. HE had blood culture sent CT abdomen and pelvis did not reveal any abnormality. He gave a history of binge drinking during the weekend from Friday to Sunday.  He denies any tick bites He does not do any yard work He works in Paediatric nurse He did not do any hiking, swimming, eating raw sea food, IV drug use, steroid use. HE takes BC power 2-3 /day He has 2 dogs He lives on his own  PMH-none   PSH- none  Social History   Socioeconomic History  . Marital status: Single    Spouse name: Not on file  . Number of children: Not on file  . Years of education: Not on file  . Highest education level: Not on file  Occupational History  . Not on file  Tobacco Use  . Smoking status: Current Every Day Smoker    Packs/day: 1.00    Years: 17.00    Pack years: 17.00    Types: Cigarettes  . Smokeless tobacco: Never Used  Vaping Use  . Vaping Use: Never used  Substance and Sexual Activity  . Alcohol use: Yes    Alcohol/week: 6.0 standard drinks    Types: 6 Cans of beer per week    Comment: 6-24oz beers on weekly bases  . Drug use: No  . Sexual activity: Not on file  Other Topics Concern  . Not on file  Social History Narrative  . Not on file   Social Determinants of Health   Financial Resource  Strain:   . Difficulty of Paying Living Expenses:   Food Insecurity:   . Worried About Charity fundraiser in the Last Year:   . Arboriculturist in the Last Year:   Transportation Needs:   . Film/video editor (Medical):   Marland Kitchen Lack of Transportation (Non-Medical):   Physical Activity:   . Days of Exercise per Week:   . Minutes of Exercise per Session:   Stress:   . Feeling of Stress :   Social Connections:   . Frequency of Communication with Friends and Family:   . Frequency of Social Gatherings with Friends and Family:   . Attends Religious Services:   . Active Member of Clubs or Organizations:   . Attends Archivist Meetings:   Marland Kitchen Marital Status:   Intimate Partner Violence:   . Fear of Current or Ex-Partner:   . Emotionally Abused:   Marland Kitchen Physically Abused:   . Sexually Abused:    No Known Allergies  FH- - diabetes in both parents ? Current Facility-Administered Medications  Medication Dose Route Frequency Provider Last Rate Last Admin  . 0.9 %  sodium chloride infusion   Intravenous Continuous Athena Masse, MD 75 mL/hr at 03/29/20  [place] at 03/29/20 0754  . acetaminophen (TYLENOL) tablet 650 mg  650 mg Oral Q6H PRN Athena Masse, MD   650 mg at 03/29/20 1529   Or  . acetaminophen (TYLENOL) suppository 650 mg  650 mg Rectal Q6H PRN Athena Masse, MD      . cefTRIAXone (ROCEPHIN) 2 g in sodium chloride 0.9 % 100 mL IVPB  2 g Intravenous Q24H Lorella Nimrod, MD      . folic acid (FOLVITE) tablet 1 mg  1 mg Oral Daily Lorella Nimrod, MD   1 mg at 03/29/20 1219  . HYDROcodone-acetaminophen (NORCO/VICODIN) 5-325 MG per tablet 1-2 tablet  1-2 tablet Oral Q4H PRN Athena Masse, MD   1 tablet at 03/29/20 0751  . metroNIDAZOLE (FLAGYL) IVPB 500 mg  500 mg Intravenous Q8H Amin, Soundra Pilon, MD 100 mL/hr at 03/29/20 1454 500 mg at 03/29/20 1454  . morphine 2 MG/ML injection 2 mg  2 mg Intravenous Q2H PRN Athena Masse, MD   2 mg at 03/29/20 0441  . multivitamin with  minerals tablet 1 tablet  1 tablet Oral Daily Lorella Nimrod, MD   1 tablet at 03/29/20 1219  . ondansetron (ZOFRAN) tablet 4 mg  4 mg Oral Q6H PRN Athena Masse, MD       Or  . ondansetron [facility]) injection 4 mg  4 mg Intravenous Q6H PRN Athena Masse, MD      . thiamine tablet 100 mg  100 mg Oral Daily Lorella Nimrod, MD   100 mg at 03/29/20 1219   Or  . thiamine (B-1) injection 100 mg  100 mg Intravenous Daily Lorella Nimrod, MD         Abtx:  Anti-infectives (From admission, onward)   Start     Dose/Rate Route Frequency Ordered Stop   03/29/20 1800  cefTRIAXone (ROCEPHIN) 2 g in sodium chloride 0.9 % 100 mL IVPB     Discontinue     2 g 200 mL/hr over 30 Minutes Intravenous Every 24 hours 03/29/20 1355     03/29/20 1400  metroNIDAZOLE (FLAGYL) IVPB 500 mg     Discontinue     500 mg 100 mL/hr over 60 Minutes Intravenous Every 8 hours 03/29/20 1201     03/29/20 1230  vancomycin (VANCOCIN) IVPB 1000 mg/200 mL premix  Status:  Discontinued        1,000 mg 200 mL/hr over 60 Minutes Intravenous Every 8 hours 03/29/20 0435 03/29/20 1355   03/29/20 1100  ceFEPIme (MAXIPIME) 2 g in sodium chloride 0.9 % 100 mL IVPB  Status:  Discontinued        2 g 200 mL/hr over 30 Minutes Intravenous Every 8 hours 03/29/20 0420 03/29/20 1355   03/29/20 0245  vancomycin (VANCOREADY) IVPB 2000 mg/400 mL        2,000 mg 200 mL/hr over 120 Minutes Intravenous  Once 03/29/20 0238 03/29/20 0630   03/29/20 0230  ceFEPIme (MAXIPIME) 2 g in sodium chloride 0.9 % 100 mL IVPB        2 g 200 mL/hr over 30 Minutes Intravenous  Once 03/29/20 0229 03/29/20 0311   03/29/20 0230  metroNIDAZOLE (FLAGYL) IVPB 500 mg        500 mg 100 mL/hr over 60 Minutes Intravenous  Once 03/29/20 0229 03/29/20 0615   03/29/20 0230  vancomycin (VANCOCIN) IVPB 1000 mg/200 mL premix  Status:  Discontinued        1,000 mg 200 mL/hr over 60  Minutes Intravenous  Once 03/29/20 0229 03/29/20 0238      REVIEW OF SYSTEMS:  Const:  fever,   chills, negative weight loss Eyes: negative diplopia or visual changes, negative eye pain ENT: negative coryza, negative sore throat Resp: baseline cough due to smoking, hemoptysis, dyspnea Cards: negative for chest pain, palpitations, lower extremity edema GU: negative for frequency, dysuria and hematuria GI:  abdominal pain, No diarrhea, bleeding, constipation Skin: negative for rash and pruritus Heme: negative for easy bruising and gum/nose bleeding MS: negative for myalgias, arthralgias, back pain and muscle weakness Neurolo:negative for headaches, dizziness, vertigo, memory problems  Psych: negative for feelings of anxiety, depression  Endocrine: negative for thyroid, diabetes Allergy/Immunology- negative for any medication or food allergies ?  Objective:  VITALS:  BP 126/60 (BP Location: Left Arm)   Pulse 79   Temp (!) 102.3 F (39.1 C) (Oral)   Resp 17   Ht '5\' 9"'  (1.753 m)   Wt 80.7 kg   SpO2 100%   BMI 26.29 kg/m  PHYSICAL EXAM:  General: Alert, cooperative, no distress, appears stated age.  Head: Normocephalic, without obvious abnormality, atraumatic. Eyes: Conjunctivae clear, anicteric sclerae. Pupils are equal ENT Nares normal. No drainage or sinus tenderness. Lips, mucosa, and tongue normal. No Thrush Neck: Supple, symmetrical, no adenopathy, thyroid: non tender no carotid bruit and no JVD. Back: No CVA tenderness. Lungs: Clear to auscultation bilaterally. No Wheezing or Rhonchi. No rales. Heart: Regular rate and rhythm, no murmur, rub or gallop. Abdomen: Soft, tenderness lower abdomen- supra pubic . Bowel sounds normal. No masses Extremities: atraumatic, no cyanosis. No edema. No clubbing Skin: No rashes or lesions. Or bruising Lymph: Cervical, supraclavicular normal. Neurologic: Grossly non-focal Pertinent Labs Lab Results CBC    Component Value Date/Time   WBC 0.7 (LL) 03/29/2020 1230   RBC 2.88 (L) 03/29/2020 1230   HGB 10.5 (L) 03/29/2020 1230    HCT 31.2 (L) 03/29/2020 1230   PLT 108 (L) 03/29/2020 1230   MCV 108.3 (H) 03/29/2020 1230   MCH 36.5 (H) 03/29/2020 1230   MCHC 33.7 03/29/2020 1230   RDW 13.9 03/29/2020 1230   LYMPHSABS 0.3 (L) 03/29/2020 0116   MONOABS 0.0 (L) 03/29/2020 0116   EOSABS 0.0 03/29/2020 0116   BASOSABS 0.0 03/29/2020 0116    CMP Latest Ref Rng & Units 03/29/2020 03/29/2020 03/29/2020  Glucose 70 - 99 mg/dL 110(H) - -  BUN 6 - 20 mg/dL 9 - -  Creatinine 0.61 - 1.24 mg/dL 1.05 0.84 -  Sodium 135 - 145 mmol/L 130(L) - -  Potassium 3.5 - 5.1 mmol/L 3.8 - -  Chloride 98 - 111 mmol/L 100 - -  CO2 22 - 32 mmol/L 22 - -  Calcium 8.9 - 10.3 mg/dL 8.4(L) - -  Total Protein 6.5 - 8.1 g/dL 7.5 - 7.8  Total Bilirubin 0.3 - 1.2 mg/dL 1.2 - 0.9  Alkaline Phos 38 - 126 U/L 75 - 85  AST 15 - 41 U/L 20 - 23  ALT 0 - 44 U/L 24 - 28      Microbiology: Recent Results (from the past 240 hour(s))  Blood culture (routine x 2)     Status: None (Preliminary result)   Collection Time: 03/29/20  1:16 AM   Specimen: BLOOD LEFT FOREARM  Result Value Ref Range Status   Specimen Description BLOOD LEFT FOREARM  Final   Special Requests   Final    BOTTLES DRAWN AEROBIC AND ANAEROBIC Blood Culture results may not be  optimal due to an excessive volume of blood received in culture bottles   Culture  Setup Time   Final    GRAM POSITIVE COCCI ANAEROBIC BOTTLE ONLY CRITICAL RESULT CALLED TO, READ BACK BY AND VERIFIED WITH: ALEX CHAPPELL AT 8546 ON 03/29/20 SNG Performed at [facility], [address] [place], [place] [address]    Culture [facility] POSITIVE COCCI  Final   Report Status PENDING  Incomplete  SARS Coronavirus 2 by RT PCR (hospital order, performed in [place] hospital lab) Nasopharyngeal Nasopharyngeal Swab     Status: None   Collection Time: 03/29/20  1:16 AM   Specimen: Nasopharyngeal Swab  Result Value Ref Range Status   SARS Coronavirus 2 NEGATIVE NEGATIVE Final    Comment: (NOTE) SARS-CoV-2 target  nucleic acids are NOT DETECTED.  The SARS-CoV-2 RNA is generally detectable in upper and lower respiratory specimens during the acute phase of infection. The lowest concentration of SARS-CoV-2 viral copies this assay can detect is 250 copies / mL. A negative result does not preclude SARS-CoV-2 infection and should not be used as the sole basis for treatment or other patient management decisions.  A negative result may occur with improper specimen collection / handling, submission of specimen other than nasopharyngeal swab, presence of viral mutation(s) within the areas targeted by this assay, and inadequate number of viral copies (<250 copies / mL). A negative result must be combined with clinical observations, patient history, and epidemiological information.  Fact Sheet for Patients:   StrictlyIdeas.no  Fact Sheet for Healthcare Providers: BankingDealers.co.za  This test is not yet approved or  cleared by the [place] FDA and has been authorized for detection and/or diagnosis of SARS-CoV-2 by FDA under an Emergency Use Authorization (EUA).  This EUA will remain in effect (meaning this test can be used) for the duration of the COVID-19 declaration under Section 564(b)(1) of the Act, 21 U.S.C. section 360bbb-3(b)(1), unless the authorization is terminated or revoked sooner.  Performed at [facility], [address] [place], [place] [address]   Blood Culture ID Panel (Reflexed)     Status: Abnormal   Collection Time: 03/29/20  1:16 AM  Result Value Ref Range Status   Enterococcus faecalis NOT DETECTED NOT DETECTED Final   Enterococcus Faecium NOT DETECTED NOT DETECTED Final   Listeria monocytogenes NOT DETECTED NOT DETECTED Final   Staphylococcus species NOT DETECTED NOT DETECTED Final   Staphylococcus aureus (BCID) NOT DETECTED NOT DETECTED Final   Staphylococcus epidermidis NOT DETECTED NOT DETECTED Final    Staphylococcus lugdunensis NOT DETECTED NOT DETECTED Final   Streptococcus species DETECTED (A) NOT DETECTED Final    Comment: Not Enterococcus species, Streptococcus agalactiae, Streptococcus pyogenes, or Streptococcus pneumoniae. CRITICAL RESULT CALLED TO, READ BACK BY AND VERIFIED WITH: ALEX CHAPPELL AT 1829 ON 03/29/20 SNG    Streptococcus agalactiae NOT DETECTED NOT DETECTED Final   Streptococcus pneumoniae NOT DETECTED NOT DETECTED Final   Streptococcus pyogenes NOT DETECTED NOT DETECTED Final   A.calcoaceticus-baumannii NOT DETECTED NOT DETECTED Final   Bacteroides fragilis NOT DETECTED NOT DETECTED Final   Enterobacterales NOT DETECTED NOT DETECTED Final   Enterobacter cloacae complex NOT DETECTED NOT DETECTED Final   Escherichia coli NOT DETECTED NOT DETECTED Final   Klebsiella aerogenes NOT DETECTED NOT DETECTED Final   Klebsiella oxytoca NOT DETECTED NOT DETECTED Final   Klebsiella pneumoniae NOT DETECTED NOT DETECTED Final   Proteus species NOT DETECTED NOT DETECTED Final   Salmonella species NOT DETECTED NOT DETECTED Final  Serratia marcescens NOT DETECTED NOT DETECTED Final   Haemophilus influenzae NOT DETECTED NOT DETECTED Final   Neisseria meningitidis NOT DETECTED NOT DETECTED Final   Pseudomonas aeruginosa NOT DETECTED NOT DETECTED Final   Stenotrophomonas maltophilia NOT DETECTED NOT DETECTED Final   Candida albicans NOT DETECTED NOT DETECTED Final   Candida auris NOT DETECTED NOT DETECTED Final   Candida glabrata NOT DETECTED NOT DETECTED Final   Candida krusei NOT DETECTED NOT DETECTED Final   Candida parapsilosis NOT DETECTED NOT DETECTED Final   Candida tropicalis NOT DETECTED NOT DETECTED Final   Cryptococcus neoformans/gattii NOT DETECTED NOT DETECTED Final    Comment: Performed at [facility], [address] [place], [place] [address]  Blood culture (routine x 2)     Status: None (Preliminary result)   Collection Time: 03/29/20  1:17 AM    Specimen: BLOOD RIGHT FOREARM  Result Value Ref Range Status   Specimen Description BLOOD RIGHT FOREARM  Final   Special Requests   Final    BOTTLES DRAWN AEROBIC AND ANAEROBIC Blood Culture results may not be optimal due to an excessive volume of blood received in culture bottles   Culture   Final    NO GROWTH < 12 HOURS Performed at [facility], [address] [place], [place] [address]    Report Status PENDING  Incomplete    IMAGING RESULTS:   I have personally reviewed the films ? Impression/Recommendation ? ?Fever with pancytopenia and abdominal pain  No tick bites or risk for ehrlichia. HIV negative Streptococcus bacteremia raises concern for a GI source Typhilitis will be a concern but CT abdomen and pelvis looks okay Depending on the type of the organism may need colonoscopy but neutropenia /thrombocytopenia will have to resolve before that.  Pancytopenia - likely combination of alcohol + B12 deficiency. Dr.Corcoran seen patient - if no improvement with B12, may need bone marrow biopsy CMV/EBV less likely- but will check He takes [facility] powder hence gastric ulcer/gastritis will have to be ruled out.  Alcohol binging- increase in lipase concerning for pancreatitis  ___________________________________________________ Discussed with patient and Dr.Corocoran Note:  This document was prepared using Dragon voice recognition software and may include unintentional dictation errors.

## 2020-03-29 NOTE — Progress Notes (Signed)
PHARMACY -  BRIEF ANTIBIOTIC NOTE   Pharmacy has received consult(s) for Vancomycin, Cefepime from an ED provider.  The patient's profile has been reviewed for ht/wt/allergies/indication/available labs.    One time order(s) placed for Vancomycin 2 gm IV X 1 and cefepime 2 gm IV X 1   Further antibiotics/pharmacy consults should be ordered by admitting physician if indicated.                       Thank you, Abran Gavigan D 03/29/2020  2:39 AM

## 2020-03-29 NOTE — Progress Notes (Signed)
   03/29/20 1527  Assess: MEWS Score  Temp (!) 102.3 F (39.1 C)  BP 126/60  Pulse Rate 79  Resp 17  Level of Consciousness Alert  SpO2 100 %  O2 Device Room Air  Assess: MEWS Score  MEWS Temp 2  MEWS Systolic 0  MEWS Pulse 0  MEWS RR 0  MEWS LOC 0  MEWS Score 2  MEWS Score Color Yellow  Assess: if the MEWS score is Yellow or Red  Were vital signs taken at a resting state? Yes  Focused Assessment Change from prior assessment (see assessment flowsheet)  Early Detection of Sepsis Score *See Row Information* Low  MEWS guidelines implemented *See Row Information* Yes  Treat  MEWS Interventions Administered prn meds/treatments  Pain Scale 0-10  Pain Score 0  Complains of Fever  Interventions Medication (see MAR)  Take Vital Signs  Increase Vital Sign Frequency  Yellow: Q 2hr X 2 then Q 4hr X 2, if remains yellow, continue Q 4hrs  Escalate  MEWS: Escalate Yellow: discuss with charge nurse/RN and consider discussing with provider and RRT  Notify: Charge Nurse/RN  Name of Charge Nurse/RN Notified Ashely Ramerez   Date Charge Nurse/RN Notified 03/29/20  Time Charge Nurse/RN Notified 7121  Notify: Provider  Provider Name/Title Regan Rakers   Date Provider Notified 03/29/20  Time Provider Notified 1533  Notification Type  (text)  Notification Reason Change in status  Response No new orders;Other (Comment) (ID consult put in )  Date of Provider Response 03/29/20  Document  Patient Outcome Other (Comment) (med mgiven to lower temp continue to monitor )

## 2020-03-29 NOTE — Progress Notes (Signed)
PHARMACY - PHYSICIAN COMMUNICATION CRITICAL VALUE ALERT - BLOOD CULTURE IDENTIFICATION (BCID)  Carl [name]. is an 44 y.o. male who presented to [facility] on 03/29/2020 with a chief complaint of    Assessment:  Febrile Neutropenia, unknown source Now with 1 of 4 bottles + for Strep Species  Name of physician (or Provider) ContactedLorella Nimrod  Current antibiotics: Cefepime/Vanc/Metronidazole  Changes to prescribed antibiotics recommended:  Chg to Ceftriaxone 2gm q24h/Metronidazole for now.    Results for orders placed or performed during the hospital encounter of 03/29/20  Blood Culture ID Panel (Reflexed) (Collected: 03/29/2020  1:16 AM)  Result Value Ref Range   Enterococcus faecalis NOT DETECTED NOT DETECTED   Enterococcus Faecium NOT DETECTED NOT DETECTED   Listeria monocytogenes NOT DETECTED NOT DETECTED   Staphylococcus species NOT DETECTED NOT DETECTED   Staphylococcus aureus (BCID) NOT DETECTED NOT DETECTED   Staphylococcus epidermidis NOT DETECTED NOT DETECTED   Staphylococcus lugdunensis NOT DETECTED NOT DETECTED   Streptococcus species DETECTED (A) NOT DETECTED   Streptococcus agalactiae NOT DETECTED NOT DETECTED   Streptococcus pneumoniae NOT DETECTED NOT DETECTED   Streptococcus pyogenes NOT DETECTED NOT DETECTED   A.calcoaceticus-baumannii NOT DETECTED NOT DETECTED   Bacteroides fragilis NOT DETECTED NOT DETECTED   Enterobacterales NOT DETECTED NOT DETECTED   Enterobacter cloacae complex NOT DETECTED NOT DETECTED   Escherichia coli NOT DETECTED NOT DETECTED   Klebsiella aerogenes NOT DETECTED NOT DETECTED   Klebsiella oxytoca NOT DETECTED NOT DETECTED   Klebsiella pneumoniae NOT DETECTED NOT DETECTED   Proteus species NOT DETECTED NOT DETECTED   Salmonella species NOT DETECTED NOT DETECTED   Serratia marcescens NOT DETECTED NOT DETECTED   Haemophilus influenzae NOT DETECTED NOT DETECTED   Neisseria meningitidis NOT DETECTED NOT DETECTED   Pseudomonas  aeruginosa NOT DETECTED NOT DETECTED   Stenotrophomonas maltophilia NOT DETECTED NOT DETECTED   Candida albicans NOT DETECTED NOT DETECTED   Candida auris NOT DETECTED NOT DETECTED   Candida glabrata NOT DETECTED NOT DETECTED   Candida krusei NOT DETECTED NOT DETECTED   Candida parapsilosis NOT DETECTED NOT DETECTED   Candida tropicalis NOT DETECTED NOT DETECTED   Cryptococcus neoformans/gattii NOT DETECTED NOT DETECTED    [name] A 03/29/2020  1:58 PM

## 2020-03-30 DIAGNOSIS — K259 Gastric ulcer, unspecified as acute or chronic, without hemorrhage or perforation: Secondary | ICD-10-CM

## 2020-03-30 DIAGNOSIS — B954 Other streptococcus as the cause of diseases classified elsewhere: Secondary | ICD-10-CM

## 2020-03-30 DIAGNOSIS — F1010 Alcohol abuse, uncomplicated: Secondary | ICD-10-CM

## 2020-03-30 DIAGNOSIS — R1013 Epigastric pain: Secondary | ICD-10-CM

## 2020-03-30 LAB — HOMOCYSTEINE: Homocysteine: 9.2 umol/L (ref 0.0–14.5)

## 2020-03-30 LAB — CBC WITH DIFFERENTIAL/PLATELET
Abs Immature Granulocytes: 0.04 10*3/uL (ref 0.00–0.07)
Basophils Absolute: 0 10*3/uL (ref 0.0–0.1)
Basophils Relative: 0 %
Eosinophils Absolute: 0 10*3/uL (ref 0.0–0.5)
Eosinophils Relative: 0 %
HCT: 28.5 % — ABNORMAL LOW (ref 39.0–52.0)
Hemoglobin: 9.5 g/dL — ABNORMAL LOW (ref 13.0–17.0)
Immature Granulocytes: 9 %
Lymphocytes Relative: 42 %
Lymphs Abs: 0.2 10*3/uL — ABNORMAL LOW (ref 0.7–4.0)
MCH: 35.7 pg — ABNORMAL HIGH (ref 26.0–34.0)
MCHC: 33.3 g/dL (ref 30.0–36.0)
MCV: 107.1 fL — ABNORMAL HIGH (ref 80.0–100.0)
Monocytes Absolute: 0 10*3/uL — ABNORMAL LOW (ref 0.1–1.0)
Monocytes Relative: 6 %
Neutro Abs: 0.2 10*3/uL — ABNORMAL LOW (ref 1.7–7.7)
Neutrophils Relative %: 43 %
Platelets: 88 10*3/uL — ABNORMAL LOW (ref 150–400)
RBC: 2.66 MIL/uL — ABNORMAL LOW (ref 4.22–5.81)
RDW: 13.6 % (ref 11.5–15.5)
Smear Review: NORMAL
WBC: 0.5 10*3/uL — CL (ref 4.0–10.5)
nRBC: 0 % (ref 0.0–0.2)

## 2020-03-30 LAB — FOLATE RBC
Folate, Hemolysate: 325 ng/mL
Folate, RBC: 735 ng/mL (ref >498)
Hematocrit: 44.2 % (ref 37.5–51.0)

## 2020-03-30 LAB — HEPATITIS B SURFACE ANTIGEN: Hepatitis B Surface Ag: NONREACTIVE

## 2020-03-30 LAB — HEPATITIS B CORE ANTIBODY, TOTAL: Hep B Core Total Ab: NONREACTIVE

## 2020-03-30 LAB — EBV AB TO VIRAL CAPSID AG PNL, IGG+IGM
EBV VCA IgG: >600 U/mL — ABNORMAL HIGH (ref 0.0–17.9)
EBV VCA IgM: <36 U/mL (ref 0.0–35.9)

## 2020-03-30 MED ORDER — K PHOS MONO-SOD PHOS DI & MONO 155-852-130 MG PO TABS
500.0000 mg | ORAL_TABLET | ORAL | Status: AC
  Administered 2020-03-30 (×2): 500 mg via ORAL
  Filled 2020-03-30 (×3): qty 2

## 2020-03-30 MED ORDER — MAGNESIUM SULFATE 2 GM/50ML IV SOLN
2.0000 g | Freq: Once | INTRAVENOUS | Status: AC
  Administered 2020-03-30: 2 g via INTRAVENOUS
  Filled 2020-03-30: qty 50

## 2020-03-30 NOTE — Progress Notes (Signed)
[facility] Hematology/Oncology Progress Note  Date of admission: 03/29/2020  Hospital day:  03/30/2020  Chief Complaint: Carl [name]. is a 44 y.o. male who was admitted through the emergency room with neutropenic fever.  Subjective:  Abdominal pain persists.  No nausea, vomiting or diarrhea.  Temperature curve appears to be improving.  Social History: The patient is alone today.  Allergies: No Known Allergies  Scheduled Medications: . folic acid  1 mg Oral Daily  . multivitamin with minerals  1 tablet Oral Daily  . thiamine  100 mg Oral Daily   Or  . thiamine  100 mg Intravenous Daily    Review of Systems: GENERAL:  Feels strong.  Temperature "up and down".  No chills or sweats.  No weight loss. PERFORMANCE STATUS (ECOG):  1 HEENT:  No visual changes, runny nose, sore throat, mouth sores or tenderness. Lungs: Chronic cough.  No shortness of breath.  No hemoptysis. Cardiac:  No chest pain, palpitations, orthopnea, or PND. GI:  Upper abdominal pain.  No nausea, vomiting, diarrhea, constipation, melena or hematochezia. GU:  No urgency, frequency, dysuria, or hematuria. Musculoskeletal:  No back pain.  No joint pain.  No muscle tenderness. Extremities:  No pain or swelling. Skin:  No rashes or skin changes. Neuro:  No headache, numbness or weakness, balance or coordination issues. Endocrine:  No diabetes, thyroid issues, hot flashes or night sweats. Psych:  No mood changes, depression or anxiety. Pain:  Upper abdominal pain with palpation and coughing. Review of systems:  All other systems reviewed and found to be negative.  Physical Exam: Blood pressure 119/71, pulse 67, temperature 98.4 F (36.9 C), temperature source Oral, resp. rate 16, height '5\' 9"'  (1.753 m), weight 178 lb (80.7 kg), SpO2 100 %.  GENERAL:  Well developed, well nourished, gentleman sitting comfortably on the medical unit in no acute distress. MENTAL STATUS:  Alert and oriented to  person, place and time. HEAD:  Short dark hair.  Normocephalic, atraumatic, face symmetric, no Cushingoid features. EYES:  Treinen eyes.  Pupils equal round and reactive to light and accomodation.  No conjunctivitis or scleral icterus. ENT:  Oropharynx clear without lesion.  Tongue normal. Mucous membranes moist.  RESPIRATORY:  Clear to auscultation without rales, wheezes or rhonchi. CARDIOVASCULAR:  Regular rate and rhythm without murmur, rub or gallop. ABDOMEN:  Soft, epigastric/mid abdomen pain without guarding or rebound tenderness.  Active bowel soundsand no hepatosplenomegaly.  No masses. SKIN:  No rashes, ulcers or lesions. EXTREMITIES: No edema, no skin discoloration or tenderness.  No palpable cords. NEUROLOGICAL: Unremarkable. PSYCH:  Appropriate.  Results for orders placed or performed during the hospital encounter of 03/29/20 (from the past 48 hour(s))  Basic metabolic panel     Status: Abnormal   Collection Time: 03/28/20 11:46 PM  Result Value Ref Range   Sodium 133 (L) 135 - 145 mmol/L   Potassium 3.9 3.5 - 5.1 mmol/L   Chloride 101 98 - 111 mmol/L   CO2 24 22 - 32 mmol/L   Glucose, Bld 95 70 - 99 mg/dL    Comment: Glucose reference range applies only to samples taken after fasting for at least 8 hours.   BUN 11 6 - 20 mg/dL   Creatinine, Ser 0.96 0.61 - 1.24 mg/dL   Calcium 8.7 (L) 8.9 - 10.3 mg/dL   GFR calc non Af Amer >60 >60 mL/min   GFR calc Af Amer >60 >60 mL/min   Anion gap 8 5 - 15  Comment: Performed at [facility], [address] [place], [place] [address]  CBC     Status: Abnormal   Collection Time: 03/28/20 11:46 PM  Result Value Ref Range   WBC 0.5 (LL) 4.0 - 10.5 K/uL    Comment: This critical result has verified and been called to [facility] by [facility] on 08 03 2021 at 0053, and has been read back.  This critical result has verified and been called to [facility] by [facility] on 08 03 2021 at 0053, and has been read  back.     RBC 2.96 (L) 4.22 - 5.81 MIL/uL   Hemoglobin 10.7 (L) 13.0 - 17.0 g/dL   HCT 32.1 (L) 39 - 52 %   MCV 108.4 (H) 80.0 - 100.0 fL   MCH 36.1 (H) 26.0 - 34.0 pg   MCHC 33.3 30.0 - 36.0 g/dL   RDW 13.5 11.5 - 15.5 %   Platelets 98 (L) 150 - 400 K/uL    Comment: Immature Platelet Fraction may be clinically indicated, consider ordering this additional test [id number]    nRBC 0.0 0.0 - 0.2 %    Comment: Performed at [facility], [address] [place], [place] [address]  Troponin I (High Sensitivity)     Status: None   Collection Time: 03/28/20 11:46 PM  Result Value Ref Range   Troponin I (High Sensitivity) 4 <18 ng/L    Comment: (NOTE) Elevated high sensitivity troponin I (hsTnI) values and significant  changes across serial measurements may suggest ACS but many other  chronic and acute conditions are known to elevate hsTnI results.  Refer to the "Links" section for chest pain algorithms and additional  guidance. Performed at [facility], [place], [place] [address]   Troponin I (High Sensitivity)     Status: None   Collection Time: 03/29/20  1:16 AM  Result Value Ref Range   Troponin I (High Sensitivity) 3 <18 ng/L    Comment: (NOTE) Elevated high sensitivity troponin I (hsTnI) values and significant  changes across serial measurements may suggest ACS but many other  chronic and acute conditions are known to elevate hsTnI results.  Refer to the "Links" section for chest pain algorithms and additional  guidance. Performed at [facility], [address] [place], [place] [address]   Hepatic function panel     Status: None   Collection Time: 03/29/20  1:16 AM  Result Value Ref Range   Total Protein 7.8 6.5 - 8.1 g/dL   Albumin 4.3 3.5 - 5.0 g/dL   AST 23 15 - 41 U/L   ALT 28 0 - 44 U/L   Alkaline Phosphatase 85 38 - 126 U/L   Total Bilirubin 0.9 0.3 - 1.2 mg/dL   Bilirubin, Direct 0.2 0.0 - 0.2 mg/dL   Indirect  Bilirubin 0.7 0.3 - 0.9 mg/dL    Comment: Performed at [facility], [address] [place], [place] [address]  Lipase, blood     Status: Abnormal   Collection Time: 03/29/20  1:16 AM  Result Value Ref Range   Lipase 225 (H) 11 - 51 U/L    Comment: Performed at [facility], [address] [place], [place] [address]  Vitamin B12     Status: Abnormal   Collection Time: 03/29/20  1:16 AM  Result Value Ref Range   Vitamin B-12 166 (L) 180 - 914 pg/mL    Comment: (NOTE) This assay is not  validated for testing neonatal or myeloproliferative syndrome specimens for Vitamin B12 levels. Performed at [facility], [place] [address], [place], [place] [address]   Folate     Status: None   Collection Time: 03/29/20  1:16 AM  Result Value Ref Range   Folate 9.2 >5.9 ng/mL    Comment: Performed at [facility], [address] [place], [place] [address]  Iron and TIBC     Status: Abnormal   Collection Time: 03/29/20  1:16 AM  Result Value Ref Range   Iron 40 (L) 45 - 182 ug/dL   TIBC 347 250 - 450 ug/dL   Saturation Ratios 12 (L) 17.9 - 39.5 %   UIBC 307 ug/dL    Comment: Performed at [facility], [address] [place], [place] [address]  Ferritin     Status: None   Collection Time: 03/29/20  1:16 AM  Result Value Ref Range   Ferritin 330 24 - 336 ng/mL    Comment: Performed at [facility], [address] [place], [place] [address]  Reticulocytes     Status: Abnormal   Collection Time: 03/29/20  1:16 AM  Result Value Ref Range   Retic Ct Pct 2.5 0.4 - 3.1 %   RBC. 2.96 (L) 4.22 - 5.81 MIL/uL   Retic Count, Absolute 73.4 19.0 - 186.0 K/uL   Immature Retic Fract 26.0 (H) 2.3 - 15.9 %    Comment: Performed at [facility], [address] [place], [place] [address]  Blood culture (routine x 2)     Status: Abnormal (Preliminary result)   Collection Time: 03/29/20  1:16 AM   Specimen: BLOOD LEFT FOREARM  Result Value Ref  Range   Specimen Description      BLOOD LEFT FOREARM Performed at [facility], [address] [place], [place] [address]    Special Requests      BOTTLES DRAWN AEROBIC AND ANAEROBIC Blood Culture results may not be optimal due to an excessive volume of blood received in culture bottles Performed at [facility], [address] [place], [place] [address]    Culture  Setup Time      GRAM POSITIVE COCCI ANAEROBIC BOTTLE ONLY CRITICAL RESULT CALLED TO, READ BACK BY AND VERIFIED WITH: ALEX CHAPPELL AT 7341 ON 03/29/20 SNG Performed at [facility], [address] [place], [place] [address]    Culture (A)     STREPTOCOCCUS GROUP G SUSCEPTIBILITIES TO FOLLOW Performed at [facility], [place] [address], [place], [place] [address]    Report Status PENDING   SARS Coronavirus 2 by RT PCR (hospital order, performed in [facility] hospital lab) Nasopharyngeal Nasopharyngeal Swab     Status: None   Collection Time: 03/29/20  1:16 AM   Specimen: Nasopharyngeal Swab  Result Value Ref Range   SARS Coronavirus 2 NEGATIVE NEGATIVE    Comment: (NOTE) SARS-CoV-2 target nucleic acids are NOT DETECTED.  The SARS-CoV-2 RNA is generally detectable in upper and lower respiratory specimens during the acute phase of infection. The lowest concentration of SARS-CoV-2 viral copies this assay can detect is 250 copies / mL. A negative result does not preclude SARS-CoV-2 infection and should not be used as the sole basis for treatment or other patient management decisions.  A negative result may occur with improper specimen collection / handling, submission of specimen other than nasopharyngeal swab, presence of viral mutation(s) within the areas targeted by this assay, and inadequate number  of viral copies (<250 copies / mL). A negative result must be combined with clinical observations, patient history, and epidemiological information.  Fact Sheet for Patients:    StrictlyIdeas.no  Fact Sheet for Healthcare Providers: BankingDealers.co.za  This test is not yet approved or  cleared by the [place] FDA and has been authorized for detection and/or diagnosis of SARS-CoV-2 by FDA under an Emergency Use Authorization (EUA).  This EUA will remain in effect (meaning this test can be used) for the duration of the COVID-19 declaration under Section 564(b)(1) of the Act, 21 U.S.C. section 360bbb-3(b)(1), unless the authorization is terminated or revoked sooner.  Performed at [facility], [address] [place], [place] [address]   CBC     Status: Abnormal   Collection Time: 03/29/20  1:16 AM  Result Value Ref Range   WBC 0.4 (LL) 4.0 - 10.5 K/uL    Comment: This critical result has verified and been called to [facility] by Lily Peer on 08 03 2021 at 0520, and has been read back.    RBC 2.94 (L) 4.22 - 5.81 MIL/uL   Hemoglobin 10.7 (L) 13.0 - 17.0 g/dL   HCT 31.5 (L) 39 - 52 %   MCV 107.1 (H) 80.0 - 100.0 fL   MCH 36.4 (H) 26.0 - 34.0 pg   MCHC 34.0 30.0 - 36.0 g/dL   RDW 13.3 11.5 - 15.5 %   Platelets 102 (L) 150 - 400 K/uL    Comment: Immature Platelet Fraction may be clinically indicated, consider ordering this additional test [id number]    nRBC 0.0 0.0 - 0.2 %    Comment: Performed at [facility], [address] [place], [place] [address]  Differential     Status: Abnormal   Collection Time: 03/29/20  1:16 AM  Result Value Ref Range   Neutrophils Relative % 33 %   Neutro Abs 0.1 (L) 1.7 - 7.7 K/uL   Lymphocytes Relative 64 %   Lymphs Abs 0.3 (L) 0.7 - 4.0 K/uL   Monocytes Relative 3 %   Monocytes Absolute 0.0 (L) 0 - 1 K/uL   Eosinophils Relative 0 %   Eosinophils Absolute 0.0 0 - 0 K/uL   Basophils Relative 0 %   Basophils Absolute 0.0 0 - 0 K/uL   WBC Morphology MORPHOLOGY UNREMARKABLE    RBC Morphology MORPHOLOGY UNREMARKABLE    Smear Review Normal  platelet morphology    Immature Granulocytes 0 %   Abs Immature Granulocytes 0.00 0.00 - 0.07 K/uL    Comment: Performed at [facility], [address] [place], [place] [address]  Pathologist smear review     Status: None   Collection Time: 03/29/20  1:16 AM  Result Value Ref Range   Path Review Blood smear is reviewed.     Comment: Patient presents with malaise and abdominal pain. Pancytopenia.  Morphology of RBCs, WBCs, and platelets within normal limits. No evidence of circulating blasts or schistocytes.  Reviewed by Elmon Kirschner, M.D. Performed at [facility], [address] [place],  [address]   Blood Culture ID Panel (Reflexed)     Status: Abnormal   Collection Time: 03/29/20  1:16 AM  Result Value Ref Range   Enterococcus faecalis NOT DETECTED NOT DETECTED   Enterococcus Faecium NOT DETECTED NOT DETECTED   Listeria monocytogenes NOT DETECTED NOT DETECTED   Staphylococcus species NOT DETECTED NOT DETECTED   Staphylococcus aureus (BCID) NOT DETECTED NOT DETECTED   Staphylococcus epidermidis  NOT DETECTED NOT DETECTED   Staphylococcus lugdunensis NOT DETECTED NOT DETECTED   Streptococcus species DETECTED (A) NOT DETECTED    Comment: Not Enterococcus species, Streptococcus agalactiae, Streptococcus pyogenes, or Streptococcus pneumoniae. CRITICAL RESULT CALLED TO, READ BACK BY AND VERIFIED WITH: ALEX CHAPPELL AT 0630 ON 03/29/20 SNG    Streptococcus agalactiae NOT DETECTED NOT DETECTED   Streptococcus pneumoniae NOT DETECTED NOT DETECTED   Streptococcus pyogenes NOT DETECTED NOT DETECTED   A.calcoaceticus-baumannii NOT DETECTED NOT DETECTED   Bacteroides fragilis NOT DETECTED NOT DETECTED   Enterobacterales NOT DETECTED NOT DETECTED   Enterobacter cloacae complex NOT DETECTED NOT DETECTED   Escherichia coli NOT DETECTED NOT DETECTED   Klebsiella aerogenes NOT DETECTED NOT DETECTED   Klebsiella oxytoca NOT DETECTED NOT DETECTED   Klebsiella  pneumoniae NOT DETECTED NOT DETECTED   Proteus species NOT DETECTED NOT DETECTED   Salmonella species NOT DETECTED NOT DETECTED   Serratia marcescens NOT DETECTED NOT DETECTED   Haemophilus influenzae NOT DETECTED NOT DETECTED   Neisseria meningitidis NOT DETECTED NOT DETECTED   Pseudomonas aeruginosa NOT DETECTED NOT DETECTED   Stenotrophomonas maltophilia NOT DETECTED NOT DETECTED   Candida albicans NOT DETECTED NOT DETECTED   Candida auris NOT DETECTED NOT DETECTED   Candida glabrata NOT DETECTED NOT DETECTED   Candida krusei NOT DETECTED NOT DETECTED   Candida parapsilosis NOT DETECTED NOT DETECTED   Candida tropicalis NOT DETECTED NOT DETECTED   Cryptococcus neoformans/gattii NOT DETECTED NOT DETECTED    Comment: Performed at [facility], [address] [place], [place] [address]  Blood culture (routine x 2)     Status: None (Preliminary result)   Collection Time: 03/29/20  1:17 AM   Specimen: BLOOD RIGHT FOREARM  Result Value Ref Range   Specimen Description BLOOD RIGHT FOREARM    Special Requests      BOTTLES DRAWN AEROBIC AND ANAEROBIC Blood Culture results may not be optimal due to an excessive volume of blood received in culture bottles   Culture      NO GROWTH 1 DAY Performed at [facility], [address] [place], [place] [address]    Report Status PENDING   Lactic acid, plasma     Status: None   Collection Time: 03/29/20  1:23 AM  Result Value Ref Range   Lactic Acid, Venous 1.1 0.5 - 1.9 mmol/L    Comment: Performed at [facility], [address] [place], [place] [address]  HIV Antibody (routine testing w rflx)     Status: None   Collection Time: 03/29/20  4:01 AM  Result Value Ref Range   HIV Screen 4th Generation wRfx Non Reactive Non Reactive    Comment: Performed at [facility], [place] [address] [place], [place] [address]  ESR     Status: Abnormal   Collection Time: 03/29/20  8:26 AM  Result Value Ref Range   Sed  Rate 37 (H) 0 - 15 mm/hr    Comment: Performed at [facility], [address] [place], [place] [address]  C-reactive protein     Status: Abnormal   Collection Time: 03/29/20  8:26 AM  Result Value Ref Range   CRP 7.0 (H) <1.0 mg/dL    Comment: Performed at [facility], [place] [address] [place], [place] [address]  Lactate dehydrogenase     Status: None   Collection Time: 03/29/20  8:26 AM  Result Value Ref Range   LDH 162 98 - 192 U/L  Comment: Performed at [facility], [address] [place], [place] [address]  Folate RBC, whole blood, send-out to ref lab     Status: None   Collection Time: 03/29/20  8:26 AM  Result Value Ref Range   Folate, Hemolysate 325.0 Not Estab. ng/mL   Hematocrit 44.2 37.5 - 51.0 %   Folate, RBC 735 >498 ng/mL    Comment: (NOTE) Performed At: [facility] [place], [place] [id number] Rush Farmer MD PX:[id number]   Protime-INR     Status: None   Collection Time: 03/29/20  8:26 AM  Result Value Ref Range   Prothrombin Time 13.8 11.4 - 15.2 seconds   INR 1.1 0.8 - 1.2    Comment: (NOTE) INR goal varies based on device and disease states. Performed at [facility], [address] [place], [place] [address]   APTT     Status: None   Collection Time: 03/29/20  8:26 AM  Result Value Ref Range   aPTT 32 24 - 36 seconds    Comment: Performed at [facility], [address] [place], [place] [address]  Fibrin derivatives D-[facility] only)     Status: Abnormal   Collection Time: 03/29/20  8:26 AM  Result Value Ref Range   Fibrin derivatives D-dimer ([facility]) 577.98 (H) 0.00 - 499.00 ng/mL (FEU)    Comment: (NOTE) <> Exclusion of Venous Thromboembolism (VTE) - OUTPATIENT ONLY   (Emergency Department or [place])    0-499 ng/ml (FEU): With a low to intermediate pretest probability                      for VTE this test result excludes the diagnosis                      of VTE.    >499 ng/ml (FEU) : VTE not excluded; additional work up for VTE is                      required.  <> Testing on Inpatients and Evaluation of Disseminated Intravascular   Coagulation (DIC) Reference Range:   0-499 ng/ml (FEU) Performed at [facility], [address] [place], [place] [address]   Creatinine, serum     Status: None   Collection Time: 03/29/20  8:26 AM  Result Value Ref Range   Creatinine, Ser 0.84 0.61 - 1.24 mg/dL   GFR calc non Af Amer >60 >60 mL/min   GFR calc Af Amer >60 >60 mL/min    Comment: Performed at [facility], [address] [place], [place] [address]  Comprehensive metabolic panel     Status: Abnormal   Collection Time: 03/29/20 12:30 PM  Result Value Ref Range   Sodium 130 (L) 135 - 145 mmol/L   Potassium 3.8 3.5 - 5.1 mmol/L   Chloride 100 98 - 111 mmol/L   CO2 22 22 - 32 mmol/L   Glucose, Bld 110 (H) 70 - 99 mg/dL    Comment: Glucose reference range applies only to samples taken after fasting for at least 8 hours.   BUN 9 6 - 20 mg/dL   Creatinine, Ser 1.05 0.61 - 1.24 mg/dL   Calcium 8.4 (L) 8.9 - 10.3 mg/dL   Total Protein 7.5 6.5 - 8.1 g/dL   Albumin 3.9 3.5 - 5.0 g/dL   AST 20 15 - 41 U/L   ALT 24 0 - 44 U/L  Alkaline Phosphatase 75 38 - 126 U/L   Total Bilirubin 1.2 0.3 - 1.2 mg/dL   GFR calc non Af Amer >60 >60 mL/min   GFR calc Af Amer >60 >60 mL/min   Anion gap 8 5 - 15    Comment: Performed at [facility], [address] [place], [place] [address]  Magnesium     Status: None   Collection Time: 03/29/20 12:30 PM  Result Value Ref Range   Magnesium 1.7 1.7 - 2.4 mg/dL    Comment: Performed at [facility], [address] [place], [place] [address]  Phosphorus     Status: Abnormal   Collection Time: 03/29/20 12:30 PM  Result Value Ref Range   Phosphorus 2.3 (L) 2.5 - 4.6 mg/dL    Comment: Performed at [facility], [address] [place], [place] [address]  CBC     Status:  Abnormal   Collection Time: 03/29/20 12:30 PM  Result Value Ref Range   WBC 0.7 (LL) 4.0 - 10.5 K/uL    Comment: This critical result has verified and been called to [facility] by Randalyn Rhea on 08 03 2021 at 1246, and has been read back.    RBC 2.88 (L) 4.22 - 5.81 MIL/uL   Hemoglobin 10.5 (L) 13.0 - 17.0 g/dL   HCT 31.2 (L) 39 - 52 %   MCV 108.3 (H) 80.0 - 100.0 fL   MCH 36.5 (H) 26.0 - 34.0 pg   MCHC 33.7 30.0 - 36.0 g/dL   RDW 13.9 11.5 - 15.5 %   Platelets 108 (L) 150 - 400 K/uL    Comment: Immature Platelet Fraction may be clinically indicated, consider ordering this additional test [id number]    nRBC 0.0 0.0 - 0.2 %    Comment: Performed at [facility], [address] [place], [place] [address]  Homocysteine     Status: None   Collection Time: 03/29/20 12:30 PM  Result Value Ref Range   Homocysteine 9.2 0.0 - 14.5 umol/L    Comment: (NOTE) Performed At: [facility] [address] [place], [place] [id number] Rush Farmer MD OZ:[id number]   CBC with Differential/Platelet     Status: Abnormal   Collection Time: 03/30/20  6:53 AM  Result Value Ref Range   WBC 0.5 (LL) 4.0 - 10.5 K/uL    Comment: CRITICAL VALUE NOTED.  VALUE IS CONSISTENT WITH PREVIOUSLY REPORTED AND CALLED VALUE.   RBC 2.66 (L) 4.22 - 5.81 MIL/uL   Hemoglobin 9.5 (L) 13.0 - 17.0 g/dL   HCT 28.5 (L) 39 - 52 %   MCV 107.1 (H) 80.0 - 100.0 fL   MCH 35.7 (H) 26.0 - 34.0 pg   MCHC 33.3 30.0 - 36.0 g/dL   RDW 13.6 11.5 - 15.5 %   Platelets 88 (L) 150 - 400 K/uL    Comment: Immature Platelet Fraction may be clinically indicated, consider ordering this additional test [id number]    nRBC 0.0 0.0 - 0.2 %   Neutrophils Relative % 43 %   Neutro Abs 0.2 (L) 1.7 - 7.7 K/uL   Lymphocytes Relative 42 %   Lymphs Abs 0.2 (L) 0.7 - 4.0 K/uL   Monocytes Relative 6 %   Monocytes Absolute 0.0 (L) 0 - 1 K/uL   Eosinophils Relative 0 %   Eosinophils Absolute 0.0 0 - 0 K/uL   Basophils  Relative 0 %   Basophils Absolute 0.0 0 - 0 K/uL   WBC Morphology MORPHOLOGY UNREMARKABLE  RBC Morphology MORPHOLOGY UNREMARKABLE    Smear Review Normal platelet morphology    Immature Granulocytes 9 %   Abs Immature Granulocytes 0.04 0.00 - 0.07 K/uL    Comment: Performed at [facility], [address] [place], [place] [address]  Hepatitis B core antibody, total     Status: None   Collection Time: 03/30/20  6:53 AM  Result Value Ref Range   Hep B Core Total Ab NON REACTIVE NON REACTIVE    Comment: Performed at [facility] [address] [place], [place] [address]  Hepatitis B surface antigen     Status: None   Collection Time: 03/30/20  6:53 AM  Result Value Ref Range   Hepatitis B Surface Ag NON REACTIVE NON REACTIVE    Comment: Performed at [facility] [address], [place],  [address]    CT ABDOMEN PELVIS W CONTRAST  Result Date: 03/29/2020 CLINICAL DATA:  Abdomen pain with fever EXAM: CT ABDOMEN AND PELVIS WITH CONTRAST TECHNIQUE: Multidetector CT imaging of the abdomen and pelvis was performed using the standard protocol following bolus administration of intravenous contrast. CONTRAST:  163m OMNIPAQUE IOHEXOL 300 MG/ML  SOLN COMPARISON:  Chest x-ray 03/29/2020 FINDINGS: Lower chest: Lung bases demonstrate no acute consolidation or pleural effusion. Borderline cardiomegaly. Hepatobiliary: No focal liver abnormality is seen. No gallstones, gallbladder wall thickening, or biliary dilatation. Pancreas: Unremarkable. No pancreatic ductal dilatation or surrounding inflammatory changes. Spleen: Subcentimeter hypodensity too small to further characterize. Adrenals/Urinary Tract: Adrenal glands are normal. Subcentimeter hypodense lesions in both kidneys too small to further characterize. No hydronephrosis. The bladder is normal Stomach/Bowel: Stomach is within normal limits. Appendix appears normal. No evidence of bowel wall thickening, distention, or  inflammatory changes. Vascular/Lymphatic: No significant vascular findings are present. No enlarged abdominal or pelvic lymph nodes. Reproductive: Prostate is slightly enlarged Other: Negative for free air or free fluid. Fat within the bilateral inguinal canal Musculoskeletal: No acute or significant osseous findings. IMPRESSION: No CT evidence for acute intra-abdominal or pelvic abnormality. Electronically Signed   By: KDonavan FoilM.D.   On: 03/29/2020 03:26   DG Chest Portable 1 View  Result Date: 03/29/2020 CLINICAL DATA:  Sepsis.  Abdominal pain.  Cough.  Smoker. EXAM: PORTABLE CHEST 1 VIEW COMPARISON:  None. FINDINGS: Shallow inspiration. Mild cardiac enlargement with some pulmonary vascular congestion. No focal consolidation or edema. No pleural effusions. No pneumothorax. Mediastinal contours appear intact. IMPRESSION: Cardiac enlargement with mild pulmonary vascular congestion. No edema or consolidation. Electronically Signed   By: WLucienne CapersM.D.   On: 03/29/2020 01:42    Assessment:  Carl [name] is a 44y.o. male with pancytopenia admitted with fever and abdominal pain.  He binge drinks from Friday-Sunday every other week.  He denies any new medications or herbal products.  He denies recurrent infections or gingivitis.  Normal labs include:  creatinine, LFTs, folate, ferritin with iron studies, HIV antibody, LDH, lactic acid, PT, PTT, HIV antibody, and COVID-19 testing.   Peripheral smear revealed pancytopenia.  Morphology of RBCs, WBCs and platelets were within normal limits.  There were no circulating blasts or schistocytes.  He has B12 deficiency.  B12 was 166 (low) and folate 9.2.  He received B12 on 03/29/2020.  Blood ID panel reveals streptococcus.  Blood culture on 03/29/2020 revealed streptococcus group G.  He is on ceftriaxone and Flagyl.  Plan:   1.   Pancytopenia  Hematocrit 32.1.  Hemoglobin 10.7.  MCV 108.4.  Platelets   98,000.  WBC 500 on  03/28/2020.  Hematocrit 31.2.  Hemoglobin 10.5.  MCV 108.3.  Platelets 108,000.  WBC 700 on 03/29/2020.  Hematocrit 28.5.  Hemoglobin   9.5.  MCV 107.1.  Platelets   88,000.  WBC 500 ([place] 200) on 03/30/2020             Differential includes: nutritional abnormalities, marrow suppression, MDS, marrow infiltrative process.                         Current work-up confirms B12 deficiency.                                     Anti-parietal and intrinsic factor antibodies pending.                                     B12 IM weekly x 4 then monthly.  B12 received on 03/29/2020.                         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             Suspect some component of thrombocytopenia and leukopenia due to current infection/sepsis.             Hepatitis B serologies are negative.  EBV serologies (+ VCA IgG and - IgM)  Check hepatitis C serologies, parvovirus B19, flow cytometry.             Pending labs include ANA with reflex, copper, zinc, CMV serologies.             No medications implicated.                 No evidence of DIC.  No splenomegaly.             Some component of anemia possibly related to gastritis (alcohol and BC powder).             Review plan for bone marrow if counts continue to drop.  Bone marrow procedure discussed in detail.  Risk versus benefits reviewed.     Discuss bone marrow on 04/01/2020 if no improvement.  Patient in agreement.   2.   Fever and neutropenia             Temperature curve improving.  T max past 24 hours 100.4.  Patient remains on broad spectrum antibiotics (ceftriaxone and Flagyl).             Blood culture notes streptococcus group G (? GI source).             Blood culture q 24 hours prn temp >= 100.4.             Fever and neutropenia precautions. 3.   Abdominal pain  Etiology felt secondary to pancreatitis and possibly GI ulcer.  Lipase 225 on 03/29/2020.  Lipase in AM.  Patient remains on a clear  liquid diet.  Anticipate upper and lower endoscopy once counts recover in the outpatient department.    Lequita Asal, MD  03/30/2020, 9:11 PM

## 2020-03-30 NOTE — Progress Notes (Signed)
ID Pt says he has abdominal pain But doing better than before No fever Patient Vitals for the past 24 hrs:  BP Temp Temp src Pulse Resp SpO2  03/30/20 1938 119/71 98.4 F (36.9 C) Oral 67 16 100 %  03/30/20 1644 126/65 99 F (37.2 C) Oral 72 16 98 %  03/30/20 1234 131/67 99.7 F (37.6 C) Oral 78 20 100 %  03/30/20 0748 122/71 99.5 F (37.5 C) Oral 77 18 100 %  03/30/20 0421 128/66 99.3 F (37.4 C) Oral 71 18 100 %  03/29/20 2344 118/67 99.6 F (37.6 C) -- 74 18 97 %  03/29/20 2024 119/64 (!) 100.4 F (38 C) -- 84 18 100 %    O/e awake and alert Chest CTA HSs1s2 Abd soft- tenderness epigastrium  Cns -non focal   Labs CBC Latest Ref Rng & Units 03/30/2020 03/29/2020 03/29/2020  WBC 4.0 - 10.5 K/uL 0.5(LL) 0.7(LL) -  Hemoglobin 13.0 - 17.0 g/dL 9.5(L) 10.5(L) -  Hematocrit 39 - 52 % 28.5(L) 31.2(L) 44.2  Platelets 150 - 400 K/uL 88(L) 108(L) -    CMP Latest Ref Rng & Units 03/29/2020 03/29/2020 03/29/2020  Glucose 70 - 99 mg/dL 110(H) - -  BUN 6 - 20 mg/dL 9 - -  Creatinine 0.61 - 1.24 mg/dL 1.05 0.84 -  Sodium 135 - 145 mmol/L 130(L) - -  Potassium 3.5 - 5.1 mmol/L 3.8 - -  Chloride 98 - 111 mmol/L 100 - -  CO2 22 - 32 mmol/L 22 - -  Calcium 8.9 - 10.3 mg/dL 8.4(L) - -  Total Protein 6.5 - 8.1 g/dL 7.5 - 7.8  Total Bilirubin 0.3 - 1.2 mg/dL 1.2 - 0.9  Alkaline Phos 38 - 126 U/L 75 - 85  AST 15 - 41 U/L 20 - 23  ALT 0 - 44 U/L 24 - 28   Micro Strep Group G blood culture   Impression/recommendation ?Fever with pancytopenia and abdominal pain Neutropenic fever  Streptococcus Group G bacteremia raises concern for a GI source  CT abdomen and pelvis looks okay Epigastric tenderness raises concern for gastric source with him taking BC powder 4/day- ? Gastric ulcer May need colonoscopy - but no intervention until neutropenia and thrombocytopenia resolves No tick bites or risk for ehrlichia. HIV negative Pancytopenia - likely combination of alcohol + B12 deficiency.  Dr.Corcoran seen patient - if no improvement with B12, may need bone marrow biopsy CMV/EBV less likely- but will check He takes [facility] powder hence gastric ulcer/gastritis will have to be ruled out.  Alcohol binge- increase in lipase raises concern for pancreatitis  Discussed the management with patient

## 2020-03-30 NOTE — Progress Notes (Signed)
PROGRESS NOTE  Carl [name]. MEQ:[id number] DOB: 08-07-1976 DOA: 03/29/2020 PCP: Verl Bangs, FNP  HPI/Recap of past 24 hours: HPI from Dr Valda Favia. is a 44 y.o. male with no significant past medical history but who 3 months prior was noted to have WBC of 1100 and was referred to hematology but was unable to, who presents to the emergency room with a 1 month complaint of generalized malaise and a 1 week history of nonproductive cough abdominal pain mostly when coughing.  Reports a low-grade temperature but no chills.  Denies nausea vomiting or change in bowel habits.  Denies chest pains or shortness of breath. In the ED, pt had a low-grade temperature of 100.2 with borderline tachycardia of 99 otherwise normal vitals.  Blood work notable for WBC of 500, platelets 98,000 and hemoglobin 10.7.  Lactic acid 1.1. Iron diminished at 40.  Lipase 225. CT chest showed mild pulmonary vascular congestion, with no edema or consolidation.  CT abdomen and pelvis showed no CT evidence for acute intra-abdominal or pelvic abnormality.  Patient was started on broad-spectrum antibiotics with cefepime, Flagyl and vancomycin.  Hospitalist consulted for admission.    Today, patient continues to complain of cough with associated abdominal pain.  Still with temp spike as of yesterday evening.   Assessment/Plan: Principal Problem:   Neutropenic fever (HCC) Active Problems:   Pancytopenia (HCC)   Pancreatitis  Neutropenic fever/abdominal pain Still with temp spike 100.4 on 03/29/2020 Worsening leukopenia, ANC 0.2 BC X2, anaerobic bottle growing Streptococcus group G (likely abdominal source as per GI) CMV/EBV pending CT abdomen/pelvis unremarkable Continue ceftriaxone, Flagyl ID consulted, appreciate recs Neutropenic precautions, monitor closely  Pancytopenia Unknown etiology, possible multifactorial (poor nutrition/moderate alcohol intake, marrow suppression, sepsis, MDS/infiltrative  process) Confirmed vitamin B12 deficiency HIV/Hepatitis B nonreactive, hepatitis C, ANA, zinc, copper level pending Heme-onc consulted, appreciate recs, may need bone marrow biopsy if no improvement after correction of vitamin B12 Continue vitamin B12 supplementation  Possible acute pancreatitis Elevated lipase on admission, will trend CT abdomen/pelvis unremarkable S/p IV fluids Monitor closely  Alcohol abuse Advised to quit CIWA protocol, without Ativan Monitor closely for signs of withdrawal which may require Ativan         Malnutrition Type:      Malnutrition Characteristics:      Nutrition Interventions:       Estimated body mass index is 26.29 kg/m as calculated from the following:   Height as of this encounter: '5\' 9"'  (1.753 m).   Weight as of this encounter: 80.7 kg.     Code Status: Full  Family Communication: None at bedside  Disposition Plan: Status is: Inpatient  Remains inpatient appropriate because:Inpatient level of care appropriate due to severity of illness   Dispo: The patient is from: Home              Anticipated d/c is to: Home              Anticipated d/c date is: 1 day              Patient currently is not medically stable to d/c.    Consultants:  Infectious disease  Heme-onc  Procedures:  None  Antimicrobials:  Ceftriaxone  Flagyl  DVT prophylaxis: SCDs   Objective: Vitals:   03/29/20 2344 03/30/20 0421 03/30/20 0748 03/30/20 1234  BP: 118/67 128/66 122/71 131/67  Pulse: 74 71 77 78  Resp: '18 18 18 20  ' Temp: 99.6 F (37.6 C) 99.3 F (  37.4 C) 99.5 F (37.5 C) 99.7 F (37.6 C)  TempSrc:  Oral Oral Oral  SpO2: 97% 100% 100% 100%  Weight:      Height:        Intake/Output Summary (Last 24 hours) at 03/30/2020 1246 Last data filed at 03/30/2020 0200 Gross per 24 hour  Intake 1294.35 ml  Output --  Net 1294.35 ml   Filed Weights   03/28/20 2333  Weight: 80.7 kg    Exam:  General: NAD    Cardiovascular: S1, S2 present  Respiratory: CTAB  Abdomen: Soft, TTP, nondistended, bowel sounds present  Musculoskeletal: No bilateral pedal edema noted  Skin: Normal  Psychiatry: Normal mood   Data Reviewed: CBC: Recent Labs  Lab 03/28/20 2346 03/29/20 0116 03/29/20 1230 03/30/20 0653  WBC 0.5* 0.4* 0.7* 0.5*  NEUTROABS  --  0.1*  --  0.2*  HGB 10.7* 10.7* 10.5* 9.5*  HCT 32.1* 31.5* 31.2* 28.5*  MCV 108.4* 107.1* 108.3* 107.1*  PLT 98* 102* 108* 88*   Basic Metabolic Panel: Recent Labs  Lab 03/28/20 2346 03/29/20 0826 03/29/20 1230  NA 133*  --  130*  K 3.9  --  3.8  CL 101  --  100  CO2 24  --  22  GLUCOSE 95  --  110*  BUN 11  --  9  CREATININE 0.96 0.84 1.05  CALCIUM 8.7*  --  8.4*  MG  --   --  1.7  PHOS  --   --  2.3*   GFR: Estimated Creatinine Clearance: 89.8 mL/min (by C-G formula based on SCr of 1.05 mg/dL). Liver Function Tests: Recent Labs  Lab 03/29/20 0116 03/29/20 1230  AST 23 20  ALT 28 24  ALKPHOS 85 75  BILITOT 0.9 1.2  PROT 7.8 7.5  ALBUMIN 4.3 3.9   Recent Labs  Lab 03/29/20 0116  LIPASE 225*   No results for input(s): AMMONIA in the last 168 hours. Coagulation Profile: Recent Labs  Lab 03/29/20 0826  INR 1.1   Cardiac Enzymes: No results for input(s): CKTOTAL, CKMB, CKMBINDEX, TROPONINI in the last 168 hours. BNP (last 3 results) No results for input(s): PROBNP in the last 8760 hours. HbA1C: No results for input(s): HGBA1C in the last 72 hours. CBG: No results for input(s): GLUCAP in the last 168 hours. Lipid Profile: No results for input(s): CHOL, HDL, LDLCALC, TRIG, CHOLHDL, LDLDIRECT in the last 72 hours. Thyroid Function Tests: No results for input(s): TSH, T4TOTAL, FREET4, T3FREE, THYROIDAB in the last 72 hours. Anemia Panel: Recent Labs    03/29/20 0116  VITAMINB12 166*  FOLATE 9.2  FERRITIN 330  TIBC 347  IRON 40*  RETICCTPCT 2.5   Urine analysis:    Component Value Date/Time    COLORURINE YELLOW 12/10/2019 [place] 12/10/2019 0859   LABSPEC 1.016 12/10/2019 0859   PHURINE 7.5 12/10/2019 0859   GLUCOSEU NEGATIVE 12/10/2019 0859   HGBUR NEGATIVE 12/10/2019 0859   BILIRUBINUR small 11/23/2019 1457   KETONESUR NEGATIVE 12/10/2019 0859   PROTEINUR NEGATIVE 12/10/2019 0859   UROBILINOGEN 0.2 11/23/2019 1457   NITRITE NEGATIVE 12/10/2019 0859   LEUKOCYTESUR NEGATIVE 12/10/2019 0859   Sepsis Labs: '@LABRCNTIP' (procalcitonin:4,lacticidven:4)  ) Recent Results (from the past 240 hour(s))  Blood culture (routine x 2)     Status: Abnormal (Preliminary result)   Collection Time: 03/29/20  1:16 AM   Specimen: BLOOD LEFT FOREARM  Result Value Ref Range Status   Specimen Description   Final  BLOOD LEFT FOREARM Performed at [facility], [address] [place], [place] [address]    Special Requests   Final    BOTTLES DRAWN AEROBIC AND ANAEROBIC Blood Culture results may not be optimal due to an excessive volume of blood received in culture bottles Performed at [facility], [address] [place], [place] [address]    Culture  Setup Time   Final    GRAM POSITIVE COCCI ANAEROBIC BOTTLE ONLY CRITICAL RESULT CALLED TO, READ BACK BY AND VERIFIED WITH: ALEX CHAPPELL AT 8270 ON 03/29/20 SNG Performed at [facility], [address] [place], [place] [address]    Culture (A)  Final    STREPTOCOCCUS GROUP G SUSCEPTIBILITIES TO FOLLOW Performed at [facility], [place] [address], [place], [place] [address]    Report Status PENDING  Incomplete  SARS Coronavirus 2 by RT PCR (hospital order, performed in [facility] hospital lab) Nasopharyngeal Nasopharyngeal Swab     Status: None   Collection Time: 03/29/20  1:16 AM   Specimen: Nasopharyngeal Swab  Result Value Ref Range Status   SARS Coronavirus 2 NEGATIVE NEGATIVE Final    Comment: (NOTE) SARS-CoV-2 target nucleic acids are NOT DETECTED.  The SARS-CoV-2 RNA is  generally detectable in upper and lower respiratory specimens during the acute phase of infection. The lowest concentration of SARS-CoV-2 viral copies this assay can detect is 250 copies / mL. A negative result does not preclude SARS-CoV-2 infection and should not be used as the sole basis for treatment or other patient management decisions.  A negative result may occur with improper specimen collection / handling, submission of specimen other than nasopharyngeal swab, presence of viral mutation(s) within the areas targeted by this assay, and inadequate number of viral copies (<250 copies / mL). A negative result must be combined with clinical observations, patient history, and epidemiological information.  Fact Sheet for Patients:   StrictlyIdeas.no  Fact Sheet for Healthcare Providers: BankingDealers.co.za  This test is not yet approved or  cleared by the [place] FDA and has been authorized for detection and/or diagnosis of SARS-CoV-2 by FDA under an Emergency Use Authorization (EUA).  This EUA will remain in effect (meaning this test can be used) for the duration of the COVID-19 declaration under Section 564(b)(1) of the Act, 21 U.S.C. section 360bbb-3(b)(1), unless the authorization is terminated or revoked sooner.  Performed at [facility], [address] [place],  [address]   Blood Culture ID Panel (Reflexed)     Status: Abnormal   Collection Time: 03/29/20  1:16 AM  Result Value Ref Range Status   Enterococcus faecalis NOT DETECTED NOT DETECTED Final   Enterococcus Faecium NOT DETECTED NOT DETECTED Final   Listeria monocytogenes NOT DETECTED NOT DETECTED Final   Staphylococcus species NOT DETECTED NOT DETECTED Final   Staphylococcus aureus (BCID) NOT DETECTED NOT DETECTED Final   Staphylococcus epidermidis NOT DETECTED NOT DETECTED Final   Staphylococcus lugdunensis NOT DETECTED NOT DETECTED Final    Streptococcus species DETECTED (A) NOT DETECTED Final    Comment: Not Enterococcus species, Streptococcus agalactiae, Streptococcus pyogenes, or Streptococcus pneumoniae. CRITICAL RESULT CALLED TO, READ BACK BY AND VERIFIED WITH: ALEX CHAPPELL AT 2197 ON 03/29/20 SNG    Streptococcus agalactiae NOT DETECTED NOT DETECTED Final   Streptococcus pneumoniae NOT DETECTED NOT DETECTED Final   Streptococcus pyogenes NOT DETECTED NOT DETECTED Final   A.calcoaceticus-baumannii NOT DETECTED NOT DETECTED Final   Bacteroides fragilis NOT DETECTED NOT DETECTED Final  Enterobacterales NOT DETECTED NOT DETECTED Final   Enterobacter cloacae complex NOT DETECTED NOT DETECTED Final   Escherichia coli NOT DETECTED NOT DETECTED Final   Klebsiella aerogenes NOT DETECTED NOT DETECTED Final   Klebsiella oxytoca NOT DETECTED NOT DETECTED Final   Klebsiella pneumoniae NOT DETECTED NOT DETECTED Final   Proteus species NOT DETECTED NOT DETECTED Final   Salmonella species NOT DETECTED NOT DETECTED Final   Serratia marcescens NOT DETECTED NOT DETECTED Final   Haemophilus influenzae NOT DETECTED NOT DETECTED Final   Neisseria meningitidis NOT DETECTED NOT DETECTED Final   Pseudomonas aeruginosa NOT DETECTED NOT DETECTED Final   Stenotrophomonas maltophilia NOT DETECTED NOT DETECTED Final   Candida albicans NOT DETECTED NOT DETECTED Final   Candida auris NOT DETECTED NOT DETECTED Final   Candida glabrata NOT DETECTED NOT DETECTED Final   Candida krusei NOT DETECTED NOT DETECTED Final   Candida parapsilosis NOT DETECTED NOT DETECTED Final   Candida tropicalis NOT DETECTED NOT DETECTED Final   Cryptococcus neoformans/gattii NOT DETECTED NOT DETECTED Final    Comment: Performed at [facility], [address] [place], [place] [address]  Blood culture (routine x 2)     Status: None (Preliminary result)   Collection Time: 03/29/20  1:17 AM   Specimen: BLOOD RIGHT FOREARM  Result Value Ref Range Status    Specimen Description BLOOD RIGHT FOREARM  Final   Special Requests   Final    BOTTLES DRAWN AEROBIC AND ANAEROBIC Blood Culture results may not be optimal due to an excessive volume of blood received in culture bottles   Culture   Final    NO GROWTH 1 DAY Performed at [facility], [address] [place], [place] [address]    Report Status PENDING  Incomplete      Studies: No results found.  Scheduled Meds: . folic acid  1 mg Oral Daily  . multivitamin with minerals  1 tablet Oral Daily  . thiamine  100 mg Oral Daily   Or  . thiamine  100 mg Intravenous Daily    Continuous Infusions: . sodium chloride Stopped (03/29/20 2314)  . cefTRIAXone (ROCEPHIN)  IV Stopped (03/29/20 1910)  . metronidazole 500 mg (03/30/20 0618)     LOS: 1 day     Alma Friendly, MD [facility]  If 7PM-7AM, please contact night-coverage [email or web address] 03/30/2020, 12:46 PM

## 2020-03-30 NOTE — TOC Initial Note (Signed)
Transition of Care (TOC) - Initial/Assessment Note    Patient Details  Name: Carl [name]. MRN: [id number] Date of Birth: 1976-02-28  Transition of Care [facility]) CM/SW Contact:    Shelbie Hutching, RN Phone Number: 03/30/2020, 10:55 AM  Clinical Narrative:                 Patient admitted to the hospital with neutropenic fever.  Patient is from home where he lives with his girlfriend.  Patient works and has insurance through his work in [employer].  [name] girlfriend provides his transportation, he does not currently have a car.  Patient does not currently have a vehicle.  Patient would like to get set up with a PCP in [place] reports he has been to [facility] in the past and would like to go there again.  RNCM was able to schedule a new patient appointment with Dr. Lorine Bears for Aug. 26th at 1020.   RNCM also provided patient with resources for substance abuse, patient admits to binge drinking on the weekends and feels he should probably cut back.    Expected Discharge Plan: Home/Self Care Barriers to Discharge: Continued Medical Work up   Patient Goals and CMS Choice        Expected Discharge Plan and Services Expected Discharge Plan: Home/Self Care   Discharge Planning Services: CM Consult, Follow-up appt scheduled   Living arrangements for the past 2 months: Single Family Home                                      Prior Living Arrangements/Services Living arrangements for the past 2 months: Single Family Home Lives with:: Significant Other Patient language and need for interpreter reviewed:: Yes Do you feel safe going back to the place where you live?: Yes      Need for Family Participation in Patient Care: No (Comment) Care giver support system in place?: Yes (comment) (girlfriend)   Criminal Activity/Legal Involvement Pertinent to Current Situation/Hospitalization: No - Comment as needed  Activities of Daily Living Home Assistive Devices/Equipment:  None ADL Screening (condition at time of admission) [name] cognitive ability adequate to safely complete daily activities?: Yes Is the patient deaf or have difficulty hearing?: No Does the patient have difficulty seeing, even when wearing glasses/contacts?: No Does the patient have difficulty concentrating, remembering, or making decisions?: No Patient able to express need for assistance with ADLs?: No Does the patient have difficulty dressing or bathing?: No Independently performs ADLs?: No Communication: Independent Dressing (OT): Independent Grooming: Independent Feeding: Independent Bathing: Independent Toileting: Independent In/Out Bed: Independent Walks in Home: Independent Does the patient have difficulty walking or climbing stairs?: No Weakness of Legs: None Weakness of Arms/Hands: None  Permission Sought/Granted Permission sought to share information with : Case Freight forwarder, Chartered certified accountant granted to share information with : Yes, Verbal Permission Granted     Permission granted to share info w AGENCY: [place]        Emotional Assessment Appearance:: Appears stated age Attitude/Demeanor/Rapport: Engaged Affect (typically observed): Accepting Orientation: : Oriented to Self, Oriented to Place, Oriented to  Time, Oriented to Situation Alcohol / Substance Use: Alcohol Use Psych Involvement: No (comment)  Admission diagnosis:  Neutropenic fever ([place]) [D70.9, R50.81] Sepsis, due to unspecified organism, unspecified whether acute organ dysfunction present [facility]) [A41.9] Patient Active Problem List   Diagnosis Date Noted  . Neutropenic fever ([place]) 03/29/2020  .  Pancytopenia ([place]) 03/29/2020  . Pancreatitis 03/29/2020  . Flank pain 11/23/2019  . Low back pain 11/23/2019  . Encounter to establish care with new doctor 11/23/2019   PCP:  Verl Bangs, FNP Pharmacy:   [facility] [place], [address] AT  [address] [place] [address] Phone: [phone] Fax: [phone]     Social Determinants of Health (SDOH) Interventions    Readmission Risk Interventions No flowsheet data found.

## 2020-03-31 ENCOUNTER — Inpatient Hospital Stay: Payer: BC Managed Care – PPO

## 2020-03-31 ENCOUNTER — Encounter: Payer: Self-pay | Admitting: Internal Medicine

## 2020-03-31 DIAGNOSIS — D619 Aplastic anemia, unspecified: Secondary | ICD-10-CM

## 2020-03-31 LAB — CULTURE, BLOOD (ROUTINE X 2)

## 2020-03-31 LAB — CBC WITH DIFFERENTIAL/PLATELET
Abs Immature Granulocytes: 0 10*3/uL (ref 0.00–0.07)
Basophils Absolute: 0 10*3/uL (ref 0.0–0.1)
Basophils Relative: 0 %
Eosinophils Absolute: 0 10*3/uL (ref 0.0–0.5)
Eosinophils Relative: 3 %
HCT: 27.2 % — ABNORMAL LOW (ref 39.0–52.0)
Hemoglobin: 9.5 g/dL — ABNORMAL LOW (ref 13.0–17.0)
Immature Granulocytes: 0 %
Lymphocytes Relative: 59 %
Lymphs Abs: 0.2 10*3/uL — ABNORMAL LOW (ref 0.7–4.0)
MCH: 36.7 pg — ABNORMAL HIGH (ref 26.0–34.0)
MCHC: 34.9 g/dL (ref 30.0–36.0)
MCV: 105 fL — ABNORMAL HIGH (ref 80.0–100.0)
Monocytes Absolute: 0 10*3/uL — ABNORMAL LOW (ref 0.1–1.0)
Monocytes Relative: 3 %
Neutro Abs: 0.1 10*3/uL — ABNORMAL LOW (ref 1.7–7.7)
Neutrophils Relative %: 35 %
Platelets: 82 10*3/uL — ABNORMAL LOW (ref 150–400)
RBC: 2.59 MIL/uL — ABNORMAL LOW (ref 4.22–5.81)
RDW: 13.2 % (ref 11.5–15.5)
Smear Review: NORMAL
WBC: 0.4 10*3/uL — CL (ref 4.0–10.5)
nRBC: 0 % (ref 0.0–0.2)

## 2020-03-31 LAB — RETICULOCYTES
Immature Retic Fract: 15.9 % (ref 2.3–15.9)
RBC.: 2.59 MIL/uL — ABNORMAL LOW (ref 4.22–5.81)
Retic Count, Absolute: 50.8 10*3/uL (ref 19.0–186.0)
Retic Ct Pct: 2 % (ref 0.4–3.1)

## 2020-03-31 LAB — MAGNESIUM: Magnesium: 2.1 mg/dL (ref 1.7–2.4)

## 2020-03-31 LAB — BASIC METABOLIC PANEL
Anion gap: 8 (ref 5–15)
BUN: 6 mg/dL (ref 6–20)
CO2: 26 mmol/L (ref 22–32)
Calcium: 8.5 mg/dL — ABNORMAL LOW (ref 8.9–10.3)
Chloride: 98 mmol/L (ref 98–111)
Creatinine, Ser: 0.79 mg/dL (ref 0.61–1.24)
GFR calc Af Amer: >60 mL/min (ref >60)
GFR calc non Af Amer: >60 mL/min (ref >60)
Glucose, Bld: 110 mg/dL — ABNORMAL HIGH (ref 70–99)
Potassium: 3.4 mmol/L — ABNORMAL LOW (ref 3.5–5.1)
Sodium: 132 mmol/L — ABNORMAL LOW (ref 135–145)

## 2020-03-31 LAB — HEPATITIS C ANTIBODY: HCV Ab: NONREACTIVE

## 2020-03-31 LAB — LIPASE, BLOOD: Lipase: 25 U/L (ref 11–51)

## 2020-03-31 LAB — COPPER, SERUM: Copper: 143 ug/dL — ABNORMAL HIGH (ref 69–132)

## 2020-03-31 LAB — CMV IGM: CMV IgM: <30 AU/mL (ref 0.0–29.9)

## 2020-03-31 LAB — CMV ANTIBODY, IGG (EIA): CMV Ab - IgG: 2.9 U/mL — ABNORMAL HIGH (ref 0.00–0.59)

## 2020-03-31 LAB — PHOSPHORUS: Phosphorus: 2.5 mg/dL (ref 2.5–4.6)

## 2020-03-31 LAB — ANA W/REFLEX: Anti Nuclear Antibody (ANA): NEGATIVE

## 2020-03-31 LAB — ANTI-PARIETAL ANTIBODY: Parietal Cell Antibody-IgG: 1 Units (ref 0.0–20.0)

## 2020-03-31 MED ORDER — POTASSIUM CHLORIDE CRYS ER 20 MEQ PO TBCR
40.0000 meq | EXTENDED_RELEASE_TABLET | Freq: Once | ORAL | Status: AC
  Administered 2020-03-31: 40 meq via ORAL
  Filled 2020-03-31: qty 2

## 2020-03-31 MED ORDER — SODIUM CHLORIDE 0.9 % IV SOLN
3.0000 g | Freq: Four times a day (QID) | INTRAVENOUS | Status: DC
  Administered 2020-03-31 – 2020-04-01 (×5): 3 g via INTRAVENOUS
  Filled 2020-03-31 (×2): qty 3
  Filled 2020-03-31: qty 8
  Filled 2020-03-31: qty 1
  Filled 2020-03-31: qty 8
  Filled 2020-03-31 (×2): qty 3
  Filled 2020-03-31: qty 8

## 2020-03-31 MED ORDER — IOHEXOL 300 MG/ML  SOLN
100.0000 mL | Freq: Once | INTRAMUSCULAR | Status: AC | PRN
  Administered 2020-03-31: 100 mL via INTRAVENOUS

## 2020-03-31 NOTE — Progress Notes (Signed)
Notified MD Horris Latino that pt is c/o worsening pain in the abd area. Reports pain is worse than yesterday.

## 2020-03-31 NOTE — Progress Notes (Signed)
PROGRESS NOTE  Carl [name]. TDS:[id number] DOB: [birth year] DOA: 03/29/2020 PCP: Verl Bangs, FNP  HPI/Recap of past 24 hours: HPI from Dr Valda Favia. is a 44 y.o. male with no significant past medical history but who 3 months prior was noted to have WBC of 1100 and was referred to hematology but was unable to, who presents to the emergency room with a 1 month complaint of generalized malaise and a 1 week history of nonproductive cough abdominal pain mostly when coughing.  Reports a low-grade temperature but no chills.  Denies nausea vomiting or change in bowel habits.  Denies chest pains or shortness of breath. In the ED, pt had a low-grade temperature of 100.2 with borderline tachycardia of 99 otherwise normal vitals.  Blood work notable for WBC of 500, platelets 98,000 and hemoglobin 10.7.  Lactic acid 1.1. Iron diminished at 40.  Lipase 225. CT chest showed mild pulmonary vascular congestion, with no edema or consolidation.  CT abdomen and pelvis showed no CT evidence for acute intra-abdominal or pelvic abnormality.  Patient was started on broad-spectrum antibiotics with cefepime, Flagyl and vancomycin.  Hospitalist consulted for admission.    Today, patient still complaining of some abdominal tenderness, but denies its worsening.  No further fever noted.  Still pancytopenic.   Assessment/Plan: Principal Problem:   Neutropenic fever (HCC) Active Problems:   Pancytopenia (HCC)   Pancreatitis  Neutropenic fever/abdominal pain Last temp spike 100.4 on 03/29/2020 Worsening leukopenia, ANC 0.2 BC X2, anaerobic bottle growing Streptococcus group G (likely abdominal source as per GI) CMV pending, EBV noted -IgM CT abdomen/pelvis unremarkable Continue ceftriaxone, Flagyl ID consulted, appreciate recs Neutropenic precautions, monitor closely  Pancytopenia Unknown etiology, possible multifactorial (poor nutrition/moderate alcohol intake, marrow suppression, sepsis,  MDS/infiltrative process) Confirmed vitamin B12 deficiency HIV/Hepatitis B nonreactive, hepatitis C, ANA, zinc, copper level pending Heme-onc consulted, appreciate recs, may need bone marrow biopsy if no improvement after correction of vitamin B12 Continue vitamin B12 supplementation  Possible acute pancreatitis Elevated lipase on admission, trended down CT abdomen/pelvis unremarkable S/p IV fluids Monitor closely  Alcohol abuse Advised to quit CIWA protocol, without Ativan Monitor closely for signs of withdrawal which may require Ativan         Malnutrition Type:      Malnutrition Characteristics:      Nutrition Interventions:       Estimated body mass index is 26.29 kg/m as calculated from the following:   Height as of this encounter: '5\' 9"'  (1.753 m).   Weight as of this encounter: 80.7 kg.     Code Status: Full  Family Communication: None at bedside  Disposition Plan: Status is: Inpatient  Remains inpatient appropriate because:Inpatient level of care appropriate due to severity of illness   Dispo: The patient is from: Home              Anticipated d/c is to: Home              Anticipated d/c date is: 2 days              Patient currently is not medically stable to d/c.    Consultants:  Infectious disease  Heme-onc  Procedures:  None  Antimicrobials:  Ceftriaxone  Flagyl  DVT prophylaxis: SCDs   Objective: Vitals:   03/30/20 2338 03/31/20 0326 03/31/20 0754 03/31/20 1149  BP: (!) 106/57 111/85 123/75 117/66  Pulse: 71 74 78 62  Resp: '18 18 16 16  ' Temp: 98.4 F (36.9  C) 98.7 F (37.1 C) 98.4 F (36.9 C) 98 F (36.7 C)  TempSrc: Oral Oral Oral Oral  SpO2: 99% 100% 100% 99%  Weight:      Height:        Intake/Output Summary (Last 24 hours) at 03/31/2020 1611 Last data filed at 03/31/2020 0315 Gross per 24 hour  Intake 250 ml  Output --  Net 250 ml   Filed Weights   03/28/20 2333  Weight: 80.7 kg     Exam:  General: NAD   Cardiovascular: S1, S2 present  Respiratory: CTAB  Abdomen: Soft, TTP, nondistended, bowel sounds present  Musculoskeletal: No bilateral pedal edema noted  Skin: Normal  Psychiatry: Normal mood   Data Reviewed: CBC: Recent Labs  Lab 03/28/20 2346 03/28/20 2346 03/29/20 0116 03/29/20 0826 03/29/20 1230 03/30/20 0653 03/31/20 0543  WBC 0.5*  --  0.4*  --  0.7* 0.5* 0.4*  NEUTROABS  --   --  0.1*  --   --  0.2* 0.1*  HGB 10.7*  --  10.7*  --  10.5* 9.5* 9.5*  HCT 32.1*   < > 31.5* 44.2 31.2* 28.5* 27.2*  MCV 108.4*  --  107.1*  --  108.3* 107.1* 105.0*  PLT 98*  --  102*  --  108* 88* 82*   < > = values in this interval not displayed.   Basic Metabolic Panel: Recent Labs  Lab 03/28/20 2346 03/29/20 0826 03/29/20 1230 03/31/20 0543  NA 133*  --  130* 132*  K 3.9  --  3.8 3.4*  CL 101  --  100 98  CO2 24  --  22 26  GLUCOSE 95  --  110* 110*  BUN 11  --  9 6  CREATININE 0.96 0.84 1.05 0.79  CALCIUM 8.7*  --  8.4* 8.5*  MG  --   --  1.7 2.1  PHOS  --   --  2.3* 2.5   GFR: Estimated Creatinine Clearance: 117.8 mL/min (by C-G formula based on SCr of 0.79 mg/dL). Liver Function Tests: Recent Labs  Lab 03/29/20 0116 03/29/20 1230  AST 23 20  ALT 28 24  ALKPHOS 85 75  BILITOT 0.9 1.2  PROT 7.8 7.5  ALBUMIN 4.3 3.9   Recent Labs  Lab 03/29/20 0116 03/31/20 0543  LIPASE 225* 25   No results for input(s): AMMONIA in the last 168 hours. Coagulation Profile: Recent Labs  Lab 03/29/20 0826  INR 1.1   Cardiac Enzymes: No results for input(s): CKTOTAL, CKMB, CKMBINDEX, TROPONINI in the last 168 hours. BNP (last 3 results) No results for input(s): PROBNP in the last 8760 hours. HbA1C: No results for input(s): HGBA1C in the last 72 hours. CBG: No results for input(s): GLUCAP in the last 168 hours. Lipid Profile: No results for input(s): CHOL, HDL, LDLCALC, TRIG, CHOLHDL, LDLDIRECT in the last 72 hours. Thyroid Function  Tests: No results for input(s): TSH, T4TOTAL, FREET4, T3FREE, THYROIDAB in the last 72 hours. Anemia Panel: Recent Labs    03/29/20 0116 03/31/20 0544  VITAMINB12 166*  --   FOLATE 9.2  --   FERRITIN 330  --   TIBC 347  --   IRON 40*  --   RETICCTPCT 2.5 2.0   Urine analysis:    Component Value Date/Time   COLORURINE YELLOW 12/10/2019 [place] 12/10/2019 0859   LABSPEC 1.016 12/10/2019 0859   PHURINE 7.5 12/10/2019 0859   GLUCOSEU NEGATIVE 12/10/2019 0859   HGBUR NEGATIVE  12/10/2019 0859   BILIRUBINUR small 11/23/2019 1457   KETONESUR NEGATIVE 12/10/2019 0859   PROTEINUR NEGATIVE 12/10/2019 0859   UROBILINOGEN 0.2 11/23/2019 1457   NITRITE NEGATIVE 12/10/2019 0859   LEUKOCYTESUR NEGATIVE 12/10/2019 0859   Sepsis Labs: '@LABRCNTIP' (procalcitonin:4,lacticidven:4)  ) Recent Results (from the past 240 hour(s))  Blood culture (routine x 2)     Status: Abnormal   Collection Time: 03/29/20  1:16 AM   Specimen: BLOOD LEFT FOREARM  Result Value Ref Range Status   Specimen Description   Final    BLOOD LEFT FOREARM Performed at [facility], [address] [place], [place] [address]    Special Requests   Final    BOTTLES DRAWN AEROBIC AND ANAEROBIC Blood Culture results may not be optimal due to an excessive volume of blood received in culture bottles Performed at [facility], [address] [place], [place] [address]    Culture  Setup Time   Final    GRAM POSITIVE COCCI ANAEROBIC BOTTLE ONLY CRITICAL RESULT CALLED TO, READ BACK BY AND VERIFIED WITH: ALEX CHAPPELL AT 6010 ON 03/29/20 SNG Performed at [facility], [address] [place], [place] [address]    Culture STREPTOCOCCUS GROUP G (A)  Final   Report Status 03/31/2020 FINAL  Final   Organism ID, Bacteria STREPTOCOCCUS GROUP G  Final      Susceptibility   Streptococcus group g - MIC*    CLINDAMYCIN <=0.25 SENSITIVE Sensitive     AMPICILLIN <=0.25 SENSITIVE Sensitive      ERYTHROMYCIN <=0.12 SENSITIVE Sensitive     VANCOMYCIN 0.5 SENSITIVE Sensitive     CEFTRIAXONE <=0.12 SENSITIVE Sensitive     LEVOFLOXACIN 0.5 SENSITIVE Sensitive     * STREPTOCOCCUS GROUP G  SARS Coronavirus 2 by RT PCR (hospital order, performed in [place] hospital lab) Nasopharyngeal Nasopharyngeal Swab     Status: None   Collection Time: 03/29/20  1:16 AM   Specimen: Nasopharyngeal Swab  Result Value Ref Range Status   SARS Coronavirus 2 NEGATIVE NEGATIVE Final    Comment: (NOTE) SARS-CoV-2 target nucleic acids are NOT DETECTED.  The SARS-CoV-2 RNA is generally detectable in upper and lower respiratory specimens during the acute phase of infection. The lowest concentration of SARS-CoV-2 viral copies this assay can detect is 250 copies / mL. A negative result does not preclude SARS-CoV-2 infection and should not be used as the sole basis for treatment or other patient management decisions.  A negative result may occur with improper specimen collection / handling, submission of specimen other than nasopharyngeal swab, presence of viral mutation(s) within the areas targeted by this assay, and inadequate number of viral copies (<250 copies / mL). A negative result must be combined with clinical observations, patient history, and epidemiological information.  Fact Sheet for Patients:   StrictlyIdeas.no  Fact Sheet for Healthcare Providers: BankingDealers.co.za  This test is not yet approved or  cleared by the [place] FDA and has been authorized for detection and/or diagnosis of SARS-CoV-2 by FDA under an Emergency Use Authorization (EUA).  This EUA will remain in effect (meaning this test can be used) for the duration of the COVID-19 declaration under Section 564(b)(1) of the Act, 21 U.S.C. section 360bbb-3(b)(1), unless the authorization is terminated or revoked sooner.  Performed at [facility], [address] [place], [place] [address]   Blood Culture ID Panel (Reflexed)     Status: Abnormal   Collection Time: 03/29/20  1:16 AM  Result Value  Ref Range Status   Enterococcus faecalis NOT DETECTED NOT DETECTED Final   Enterococcus Faecium NOT DETECTED NOT DETECTED Final   Listeria monocytogenes NOT DETECTED NOT DETECTED Final   Staphylococcus species NOT DETECTED NOT DETECTED Final   Staphylococcus aureus (BCID) NOT DETECTED NOT DETECTED Final   Staphylococcus epidermidis NOT DETECTED NOT DETECTED Final   Staphylococcus lugdunensis NOT DETECTED NOT DETECTED Final   Streptococcus species DETECTED (A) NOT DETECTED Final    Comment: Not Enterococcus species, Streptococcus agalactiae, Streptococcus pyogenes, or Streptococcus pneumoniae. CRITICAL RESULT CALLED TO, READ BACK BY AND VERIFIED WITH: ALEX CHAPPELL AT 6761 ON 03/29/20 SNG    Streptococcus agalactiae NOT DETECTED NOT DETECTED Final   Streptococcus pneumoniae NOT DETECTED NOT DETECTED Final   Streptococcus pyogenes NOT DETECTED NOT DETECTED Final   A.calcoaceticus-baumannii NOT DETECTED NOT DETECTED Final   Bacteroides fragilis NOT DETECTED NOT DETECTED Final   Enterobacterales NOT DETECTED NOT DETECTED Final   Enterobacter cloacae complex NOT DETECTED NOT DETECTED Final   Escherichia coli NOT DETECTED NOT DETECTED Final   Klebsiella aerogenes NOT DETECTED NOT DETECTED Final   Klebsiella oxytoca NOT DETECTED NOT DETECTED Final   Klebsiella pneumoniae NOT DETECTED NOT DETECTED Final   Proteus species NOT DETECTED NOT DETECTED Final   Salmonella species NOT DETECTED NOT DETECTED Final   Serratia marcescens NOT DETECTED NOT DETECTED Final   Haemophilus influenzae NOT DETECTED NOT DETECTED Final   Neisseria meningitidis NOT DETECTED NOT DETECTED Final   Pseudomonas aeruginosa NOT DETECTED NOT DETECTED Final   Stenotrophomonas maltophilia NOT DETECTED NOT DETECTED Final   Candida albicans NOT DETECTED NOT DETECTED Final   Candida auris  NOT DETECTED NOT DETECTED Final   Candida glabrata NOT DETECTED NOT DETECTED Final   Candida krusei NOT DETECTED NOT DETECTED Final   Candida parapsilosis NOT DETECTED NOT DETECTED Final   Candida tropicalis NOT DETECTED NOT DETECTED Final   Cryptococcus neoformans/gattii NOT DETECTED NOT DETECTED Final    Comment: Performed at [facility], [address] [place], [place] [address]  Blood culture (routine x 2)     Status: None (Preliminary result)   Collection Time: 03/29/20  1:17 AM   Specimen: BLOOD RIGHT FOREARM  Result Value Ref Range Status   Specimen Description BLOOD RIGHT FOREARM  Final   Special Requests   Final    BOTTLES DRAWN AEROBIC AND ANAEROBIC Blood Culture results may not be optimal due to an excessive volume of blood received in culture bottles   Culture   Final    NO GROWTH 2 DAYS Performed at [facility], [address] [place], [place] [address]    Report Status PENDING  Incomplete      Studies: No results found.  Scheduled Meds: . folic acid  1 mg Oral Daily  . multivitamin with minerals  1 tablet Oral Daily  . thiamine  100 mg Oral Daily   Or  . thiamine  100 mg Intravenous Daily    Continuous Infusions: . cefTRIAXone (ROCEPHIN)  IV Stopped (03/30/20 1923)  . metronidazole 500 mg (03/31/20 1550)     LOS: 2 days     Alma Friendly, MD [facility]  If 7PM-7AM, please contact night-coverage [email or web address] 03/31/2020, 4:11 PM

## 2020-03-31 NOTE — Progress Notes (Signed)
[facility] Hematology/Oncology Progress Note  Date of admission: 03/29/2020  Hospital day:  03/31/2020  Chief Complaint: Carl Nikoloz Huy. is a 44 y.o. male who was admitted through the emergency room with neutropenic fever.  Subjective:  Patint notes increased abdominal pain today requiring pain medication.  He denies any nausea, vomiting or diarrhea.  Temperature curve improved (Tmax 24 hours 99).  Social History: The patient is alone today.  Allergies: No Known Allergies  Scheduled Medications: . folic acid  1 mg Oral Daily  . multivitamin with minerals  1 tablet Oral Daily  . thiamine  100 mg Oral Daily   Or  . thiamine  100 mg Intravenous Daily    Review of Systems: GENERAL:  Feels "strong".  Temperature "better".  He denies any chills or sweats.  No weight loss. PERFORMANCE STATUS (ECOG):  1 HEENT:  No visual changes, runny nose, sore throat, mouth sores or tenderness. Lungs: No shortness of breath or change in cough.  No hemoptysis. Cardiac:  No chest pain, palpitations, orthopnea, or PND. GI:  Pain localized to umbilucus.  No upper abdominal pain, nausea, vomiting, diarrhea, constipation, melena or hematochezia. GU:  No urinary symptoms. Musculoskeletal:  No back or joint pain.  No muscle tenderness. Extremities:  No pain or swelling. Skin:  Area of slight redness near umbilicus. Neuro:  No headache, numbness or weakness, balance or coordination issues. Endocrine:  No diabetes, thyroid issues, hot flashes or night sweats. Psych:  No mood changes, depression or anxiety. Pain:  Abdominal discomfort with coughing.  Feels better when lying flat. Review of systems:  All other systems reviewed and found to be negative.  Physical Exam: Blood pressure 133/69, pulse 76, temperature 98.4 F (36.9 C), resp. rate 16, height 5\' 9"  (1.753 m), weight 178 lb (80.7 kg), SpO2 98 %.  GENERAL:  Well developed, well nourished, gentleman sitting comfortably on the  medical unit in no acute distress. MENTAL STATUS:  Alert and oriented to person, place and time. HEAD:  Short dark hair and mustache.  Normocephalic, atraumatic, face symmetric, no Cushingoid features. EYES:  Biller eyes.  Pupils equal round and reactive to light and accomodation.  No conjunctivitis or scleral icterus. ENT:  Oropharynx clear without lesion.  Tongue normal. Mucous membranes moist.  RESPIRATORY:  Clear to auscultation without rales, wheezes or rhonchi. CARDIOVASCULAR:  Regular rate and rhythm without murmur, rub or gallop. ABDOMEN:  Soft, with localized tenderness in the umbilical region (? hernia) with mild erythema and slight increased warmth.  Active bowel sounds and no hepatosplenomegaly.  No masses. SKIN:  Mild erythema and edema associated with umbilicus and just inferiorly. EXTREMITIES: No edema, no skin discoloration or tenderness.  No palpable cords. LYMPH NODES: No palpable cervical, supraclavicular, axillary or inguinal adenopathy  NEUROLOGICAL: Unremarkable. PSYCH:  Appropriate.    Results for orders placed or performed during the hospital encounter of 03/29/20 (from the past 48 hour(s))  CMV IgM     Status: None   Collection Time: 03/30/20  6:53 AM  Result Value Ref Range   CMV IgM <30.0 0.0 - 29.9 AU/mL    Comment: (NOTE)                                Negative         <30.0  Equivocal  30.0 - 34.9                                Positive         >34.9 A positive result is generally indicative of acute infection, reactivation or persistent IgM production. Performed At: [facility] [place], [place] [id number] Rush Farmer MD WU:[id number]   Cmv antibody, IgG (EIA)     Status: Abnormal   Collection Time: 03/30/20  6:53 AM  Result Value Ref Range   CMV Ab - IgG 2.90 (H) 0.00 - 0.59 U/mL    Comment: (NOTE)                               Negative          <0.60                               Equivocal    0.60 - 0.69                               Positive          >0.69 Performed At: [facility] [address] [place], [place] [id number] Rush Farmer MD VH:[id number]   CBC with Differential/Platelet     Status: Abnormal   Collection Time: 03/30/20  6:53 AM  Result Value Ref Range   WBC 0.5 (LL) 4.0 - 10.5 K/uL    Comment: CRITICAL VALUE NOTED.  VALUE IS CONSISTENT WITH PREVIOUSLY REPORTED AND CALLED VALUE.   RBC 2.66 (L) 4.22 - 5.81 MIL/uL   Hemoglobin 9.5 (L) 13.0 - 17.0 g/dL   HCT 28.5 (L) 39 - 52 %   MCV 107.1 (H) 80.0 - 100.0 fL   MCH 35.7 (H) 26.0 - 34.0 pg   MCHC 33.3 30.0 - 36.0 g/dL   RDW 13.6 11.5 - 15.5 %   Platelets 88 (L) 150 - 400 K/uL    Comment: Immature Platelet Fraction may be clinically indicated, consider ordering this additional test [id number]    nRBC 0.0 0.0 - 0.2 %   Neutrophils Relative % 43 %   Neutro Abs 0.2 (L) 1.7 - 7.7 K/uL   Lymphocytes Relative 42 %   Lymphs Abs 0.2 (L) 0.7 - 4.0 K/uL   Monocytes Relative 6 %   Monocytes Absolute 0.0 (L) 0 - 1 K/uL   Eosinophils Relative 0 %   Eosinophils Absolute 0.0 0 - 0 K/uL   Basophils Relative 0 %   Basophils Absolute 0.0 0 - 0 K/uL   WBC Morphology MORPHOLOGY UNREMARKABLE    RBC Morphology MORPHOLOGY UNREMARKABLE    Smear Review Normal platelet morphology    Immature Granulocytes 9 %   Abs Immature Granulocytes 0.04 0.00 - 0.07 K/uL    Comment: Performed at [facility], [address] [place], [place] [address]  Hepatitis B core antibody, total     Status: None   Collection Time: 03/30/20  6:53 AM  Result Value Ref Range   Hep B Core Total Ab NON REACTIVE NON REACTIVE    Comment: Performed at [facility], [place] [address], [place], [place] [address]  Hepatitis B surface antigen     Status: None   Collection Time: 03/30/20  6:53 AM  Result Value Ref Range   Hepatitis B Surface Ag NON REACTIVE NON REACTIVE    Comment: Performed at [facility] [address],  [place], [place] [address]  ANA w/Reflex     Status: None   Collection Time: 03/30/20  6:53 AM  Result Value Ref Range   Anti Nuclear Antibody (ANA) Negative Negative    Comment: (NOTE) Performed At: [facility] [place], [place] [id number] Rush Farmer MD AO:[id number]   Anti-parietal antibody     Status: None   Collection Time: 03/30/20  6:53 AM  Result Value Ref Range   Parietal Cell Antibody-IgG 1.0 0.0 - 20.0 Units    Comment: (NOTE)                                Negative    0.0 - 20.0                                Equivocal  20.1 - 24.9                                Positive         >24.9 Parietal Cell Antibodies are found in 90% of patients with pernicious anemia and 30% of first degree relatives with pernicious anemia. Performed At: [facility] [address] [place], [place] [id number] Rush Farmer MD LK:[id number]   Lipase, blood     Status: None   Collection Time: 03/31/20  5:43 AM  Result Value Ref Range   Lipase 25 11 - 51 U/L    Comment: Performed at [facility], [address] [place], [place] [address]  CBC with Differential/Platelet     Status: Abnormal   Collection Time: 03/31/20  5:43 AM  Result Value Ref Range   WBC 0.4 (LL) 4.0 - 10.5 K/uL    Comment: CRITICAL VALUE NOTED.  VALUE IS CONSISTENT WITH PREVIOUSLY REPORTED AND CALLED VALUE.   RBC 2.59 (L) 4.22 - 5.81 MIL/uL   Hemoglobin 9.5 (L) 13.0 - 17.0 g/dL   HCT 27.2 (L) 39 - 52 %   MCV 105.0 (H) 80.0 - 100.0 fL   MCH 36.7 (H) 26.0 - 34.0 pg   MCHC 34.9 30.0 - 36.0 g/dL   RDW 13.2 11.5 - 15.5 %   Platelets 82 (L) 150 - 400 K/uL    Comment: Immature Platelet Fraction may be clinically indicated, consider ordering this additional test [id number]    nRBC 0.0 0.0 - 0.2 %   Neutrophils Relative % 35 %   Neutro Abs 0.1 (L) 1.7 - 7.7 K/uL   Lymphocytes Relative 59 %   Lymphs Abs 0.2 (L) 0.7 - 4.0 K/uL   Monocytes Relative 3 %   Monocytes Absolute 0.0 (L) 0  - 1 K/uL   Eosinophils Relative 3 %   Eosinophils Absolute 0.0 0 - 0 K/uL   Basophils Relative 0 %   Basophils Absolute 0.0 0 - 0 K/uL   WBC Morphology MORPHOLOGY UNREMARKABLE    RBC Morphology MORPHOLOGY UNREMARKABLE    Smear Review Normal platelet morphology    Immature Granulocytes 0 %   Abs Immature Granulocytes 0.00 0.00 - 0.07 K/uL    Comment: Performed at [facility], [address], [place], [place] [address]  Basic metabolic panel     Status: Abnormal   Collection Time: 03/31/20  5:43 AM  Result Value Ref Range   Sodium 132 (L) 135 - 145 mmol/L   Potassium 3.4 (L) 3.5 - 5.1 mmol/L   Chloride 98 98 - 111 mmol/L   CO2 26 22 - 32 mmol/L   Glucose, Bld 110 (H) 70 - 99 mg/dL    Comment: Glucose reference range applies only to samples taken after fasting for at least 8 hours.   BUN 6 6 - 20 mg/dL   Creatinine, Ser 0.79 0.61 - 1.24 mg/dL   Calcium 8.5 (L) 8.9 - 10.3 mg/dL   GFR calc non Af Amer >60 >60 mL/min   GFR calc Af Amer >60 >60 mL/min   Anion gap 8 5 - 15    Comment: Performed at [facility], [address], [place], [place] [address]  Phosphorus     Status: None   Collection Time: 03/31/20  5:43 AM  Result Value Ref Range   Phosphorus 2.5 2.5 - 4.6 mg/dL    Comment: Performed at [facility], [address] [place], [place] [address]  Magnesium     Status: None   Collection Time: 03/31/20  5:43 AM  Result Value Ref Range   Magnesium 2.1 1.7 - 2.4 mg/dL    Comment: Performed at [facility], [address] [place], [place] [address]  Hepatitis C antibody     Status: None   Collection Time: 03/31/20  5:43 AM  Result Value Ref Range   HCV Ab NON REACTIVE NON REACTIVE    Comment: (NOTE) Nonreactive HCV antibody screen is consistent with no HCV infections,  unless recent infection is suspected or other evidence exists to indicate HCV infection.  Performed at [facility], [place] [address] [place],  [place] [address]   Reticulocytes     Status: Abnormal   Collection Time: 03/31/20  5:44 AM  Result Value Ref Range   Retic Ct Pct 2.0 0.4 - 3.1 %   RBC. 2.59 (L) 4.22 - 5.81 MIL/uL   Retic Count, Absolute 50.8 19.0 - 186.0 K/uL   Immature Retic Fract 15.9 2.3 - 15.9 %    Comment: Performed at [facility], [address], [place], [place] [address]    No results found.  Assessment:  Carl [name]. is a 45 y.o. male with pancytopenia admitted with fever and abdominal pain.  He binge drinks from Friday-Sunday every other week.  He denies any new medications or herbal products.  He denies recurrent infections or gingivitis.  Normal labs include:  creatinine, LFTs, folate, ferritin with iron studies, HIV antibody, LDH, lactic acid, PT, PTT, ANA, hepatitis B antibody, hepatitis C antibody, HIV antibody, and COVID-19 testing.  Copper 143 (69-132).  Peripheral smear revealed pancytopenia.  Morphology of RBCs, WBCs and platelets were within normal limits.  There were no circulating blasts or schistocytes.  He has B12 deficiency.  B12 was 166 (low) and folate 9.2.  He received B12 on 03/29/2020.  Blood ID panel reveals streptococcus.  Blood culture on 03/29/2020 revealed streptococcus group G.  He is on ceftriaxone and Flagyl.  Plan:   1.   Pancytopenia             Hematocrit 32.1.  Hemoglobin 10.7.  MCV 108.4.  Platelets   98,000.  WBC 500 on 03/28/2020.  Hematocrit 31.2.  Hemoglobin 10.5.  MCV 108.3.  Platelets 108,000.  WBC 700 on  03/29/2020.  Hematocrit 28.5.  Hemoglobin   9.5.  MCV 107.1.  Platelets   88,000.  WBC 500 (ANC 200) on 03/30/2020  Hematocrit 27.2.  Hemoglobin   9.5.  MCV 105.0.  Platelets   82,000.  WBC 400 ([place] 100) on 03/31/2020.             Differential includes: nutritional abnormalities, marrow suppression, MDS, aplastic anemia, marrow infiltrative process.                         Current work-up confirms only B12 deficiency.                                      Anti-parietal antibodies negative. Intrinsic factor antibodies pending.                                     B12 IM weekly x 4 then monthly.  B12 received on 03/29/2020.                         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             Suspect some component of thrombocytopenia and leukopenia due to current infection/sepsis.   Expected improvement in counts not occurring with resolution of fever.   Discuss plan for bone marrow aspirate and biopsy.             EBV serologies (+ VCA IgG and - IgM).  CMV IgM- and IgG +.  Pending labs include: zinc, parvovirus B19, flow cytometry.             No medications implicated.                 No evidence of DIC.  No splenomegaly.  2.   Fever and neutropenia             Temperature curve improved.  T max past 24 hours 99.  Antibiotics changed from ceftriaxone and Flagyl to Unasyn.             Blood culture notes streptococcus group G (? GI source).             Blood culture q 24 hours prn temp >= 100.4.             Continue fever and neutropenia precuations. 3.   Abdominal pain  Etiology initially felt secondary to pancreatitis and possibly GI ulcer.   Lipase was 225 on 08/03/2021and 25 today.   Patient remains on a clear liquid diet.  Patient without epigastric pain today.  Pain localized to periumbilical region with associated mild erythema and edema.   Review initial CT scan with radiology.   Unclear if possible cellulitis or inflamed hernia. 4.   Disposition  [name] father requesting transfer to [facility] or [place].  Discussed with hospitalist.  Addendum:  IR unable to perform bone marrow tomorrow secondary to schedule.  Will discuss with radiology in AM.    Lequita Asal, MD  03/31/2020, 7:29 PM

## 2020-03-31 NOTE — Progress Notes (Signed)
ID  C/o pain umbilicus Noted a swelling and redness today  Patient Vitals for the past 24 hrs:  BP Temp Temp src Pulse Resp SpO2  03/31/20 1149 117/66 98 F (36.7 C) Oral 62 16 99 %  03/31/20 0754 123/75 98.4 F (36.9 C) Oral 78 16 100 %  03/31/20 0326 111/85 98.7 F (37.1 C) Oral 74 18 100 %  03/30/20 2338 (!) 106/57 98.4 F (36.9 C) Oral 71 18 99 %  03/30/20 1938 119/71 98.4 F (36.9 C) Oral 67 16 100 %  03/30/20 1644 126/65 99 F (37.2 C) Oral 72 16 98 %   O/e Tender, nodule umbilicus with early fluctuation      CBC Latest Ref Rng & Units 03/31/2020 03/30/2020 03/29/2020  WBC 4.0 - 10.5 K/uL 0.4(LL) 0.5(LL) 0.7(LL)  Hemoglobin 13.0 - 17.0 g/dL 9.5(L) 9.5(L) 10.5(L)  Hematocrit 39 - 52 % 27.2(L) 28.5(L) 31.2(L)  Platelets 150 - 400 K/uL 82(L) 88(L) 108(L)    CMP Latest Ref Rng & Units 03/31/2020 03/29/2020 03/29/2020  Glucose 70 - 99 mg/dL 110(H) 110(H) -  BUN 6 - 20 mg/dL 6 9 -  Creatinine 0.61 - 1.24 mg/dL 0.79 1.05 0.84  Sodium 135 - 145 mmol/L 132(L) 130(L) -  Potassium 3.5 - 5.1 mmol/L 3.4(L) 3.8 -  Chloride 98 - 111 mmol/L 98 100 -  CO2 22 - 32 mmol/L 26 22 -  Calcium 8.9 - 10.3 mg/dL 8.5(L) 8.4(L) -  Total Protein 6.5 - 8.1 g/dL - 7.5 -  Total Bilirubin 0.3 - 1.2 mg/dL - 1.2 -  Alkaline Phos 38 - 126 U/L - 75 -  AST 15 - 41 U/L - 20 -  ALT 0 - 44 U/L - 24 -   BC- 1of 4 Group G strep  Impression/recommendation  Febrile neutropenia- fever has resolved   Umbilical nodule - erythematous and tender- likely evolving abscess VS internal infection Needs Surgical consult  Group G strep bacteremia source likely GI- -- change ceftriaxone to unasyn Need upper endo and colonoscopy once neutropenia and thrombocytopenia resolved  Pancytopenia ? B12 def ? Alcohol ? Aplastic anemia  Discussed the management with patient and oncologist

## 2020-03-31 NOTE — Progress Notes (Signed)
D/C tele monitoring per Dr Ezenduka 

## 2020-04-01 ENCOUNTER — Ambulatory Visit (HOSPITAL_COMMUNITY)
Admission: AD | Admit: 2020-04-01 | Discharge: 2020-04-01 | Disposition: A | Discharge status: Home / Self Care | Payer: BC Managed Care – PPO | Source: Other Acute Inpatient Hospital | Attending: Internal Medicine | Admitting: Internal Medicine

## 2020-04-01 ENCOUNTER — Encounter: Payer: Self-pay | Admitting: Family

## 2020-04-01 ENCOUNTER — Encounter: Payer: Self-pay | Admitting: Internal Medicine

## 2020-04-01 ENCOUNTER — Encounter
Admission: EM | Disposition: A | Discharge status: Other Acute Inpatient Hospital | Payer: Self-pay | Source: Home / Self Care | Attending: Internal Medicine

## 2020-04-01 ENCOUNTER — Inpatient Hospital Stay: Payer: BC Managed Care – PPO

## 2020-04-01 DIAGNOSIS — C9200 Acute myeloblastic leukemia, not having achieved remission: Secondary | ICD-10-CM | POA: Diagnosis not present

## 2020-04-01 DIAGNOSIS — Z20822 Contact with and (suspected) exposure to covid-19: Secondary | ICD-10-CM | POA: Diagnosis not present

## 2020-04-01 DIAGNOSIS — K529 Noninfective gastroenteritis and colitis, unspecified: Secondary | ICD-10-CM | POA: Diagnosis not present

## 2020-04-01 DIAGNOSIS — A419 Sepsis, unspecified organism: Secondary | ICD-10-CM | POA: Insufficient documentation

## 2020-04-01 DIAGNOSIS — D61818 Other pancytopenia: Secondary | ICD-10-CM | POA: Diagnosis not present

## 2020-04-01 DIAGNOSIS — C9592 Leukemia, unspecified, in relapse: Secondary | ICD-10-CM | POA: Diagnosis not present

## 2020-04-01 DIAGNOSIS — C9500 Acute leukemia of unspecified cell type not having achieved remission: Secondary | ICD-10-CM | POA: Diagnosis not present

## 2020-04-01 DIAGNOSIS — F17210 Nicotine dependence, cigarettes, uncomplicated: Secondary | ICD-10-CM | POA: Diagnosis not present

## 2020-04-01 DIAGNOSIS — Z87898 Personal history of other specified conditions: Secondary | ICD-10-CM | POA: Insufficient documentation

## 2020-04-01 DIAGNOSIS — F17213 Nicotine dependence, cigarettes, with withdrawal: Secondary | ICD-10-CM | POA: Diagnosis not present

## 2020-04-01 DIAGNOSIS — F1020 Alcohol dependence, uncomplicated: Secondary | ICD-10-CM | POA: Diagnosis not present

## 2020-04-01 DIAGNOSIS — B954 Other streptococcus as the cause of diseases classified elsewhere: Secondary | ICD-10-CM | POA: Diagnosis not present

## 2020-04-01 DIAGNOSIS — K436 Other and unspecified ventral hernia with obstruction, without gangrene: Secondary | ICD-10-CM | POA: Diagnosis not present

## 2020-04-01 DIAGNOSIS — K420 Umbilical hernia with obstruction, without gangrene: Secondary | ICD-10-CM | POA: Diagnosis not present

## 2020-04-01 DIAGNOSIS — D61810 Antineoplastic chemotherapy induced pancytopenia: Secondary | ICD-10-CM | POA: Diagnosis not present

## 2020-04-01 DIAGNOSIS — Z6822 Body mass index (BMI) 22.0-22.9, adult: Secondary | ICD-10-CM | POA: Diagnosis not present

## 2020-04-01 DIAGNOSIS — R7881 Bacteremia: Secondary | ICD-10-CM | POA: Diagnosis not present

## 2020-04-01 DIAGNOSIS — R931 Abnormal findings on diagnostic imaging of heart and coronary circulation: Secondary | ICD-10-CM | POA: Diagnosis not present

## 2020-04-01 DIAGNOSIS — D75A Glucose-6-phosphate dehydrogenase (G6PD) deficiency without anemia: Secondary | ICD-10-CM | POA: Diagnosis not present

## 2020-04-01 DIAGNOSIS — R5081 Fever presenting with conditions classified elsewhere: Secondary | ICD-10-CM | POA: Diagnosis not present

## 2020-04-01 DIAGNOSIS — K429 Umbilical hernia without obstruction or gangrene: Secondary | ICD-10-CM | POA: Diagnosis not present

## 2020-04-01 LAB — CBC WITH DIFFERENTIAL/PLATELET
Abs Immature Granulocytes: 0 10*3/uL (ref 0.00–0.07)
Basophils Absolute: 0 10*3/uL (ref 0.0–0.1)
Basophils Relative: 0 %
Eosinophils Absolute: 0 10*3/uL (ref 0.0–0.5)
Eosinophils Relative: 0 %
HCT: 26.6 % — ABNORMAL LOW (ref 39.0–52.0)
Hemoglobin: 9.1 g/dL — ABNORMAL LOW (ref 13.0–17.0)
Immature Granulocytes: 0 %
Lymphocytes Relative: 75 %
Lymphs Abs: 0.4 10*3/uL — ABNORMAL LOW (ref 0.7–4.0)
MCH: 36.1 pg — ABNORMAL HIGH (ref 26.0–34.0)
MCHC: 34.2 g/dL (ref 30.0–36.0)
MCV: 105.6 fL — ABNORMAL HIGH (ref 80.0–100.0)
Monocytes Absolute: 0 10*3/uL — ABNORMAL LOW (ref 0.1–1.0)
Monocytes Relative: 2 %
Neutro Abs: 0.1 10*3/uL — ABNORMAL LOW (ref 1.7–7.7)
Neutrophils Relative %: 23 %
Platelets: 100 10*3/uL — ABNORMAL LOW (ref 150–400)
RBC: 2.52 MIL/uL — ABNORMAL LOW (ref 4.22–5.81)
RDW: 13.5 % (ref 11.5–15.5)
Smear Review: NORMAL
WBC: 0.5 10*3/uL — CL (ref 4.0–10.5)
nRBC: 0 % (ref 0.0–0.2)

## 2020-04-01 LAB — BASIC METABOLIC PANEL
Anion gap: 9 (ref 5–15)
BUN: 5 mg/dL — ABNORMAL LOW (ref 6–20)
CO2: 25 mmol/L (ref 22–32)
Calcium: 8.4 mg/dL — ABNORMAL LOW (ref 8.9–10.3)
Chloride: 98 mmol/L (ref 98–111)
Creatinine, Ser: 0.9 mg/dL (ref 0.61–1.24)
GFR calc Af Amer: >60 mL/min (ref >60)
GFR calc non Af Amer: >60 mL/min (ref >60)
Glucose, Bld: 97 mg/dL (ref 70–99)
Potassium: 3.8 mmol/L (ref 3.5–5.1)
Sodium: 132 mmol/L — ABNORMAL LOW (ref 135–145)

## 2020-04-01 LAB — PROTIME-INR
INR: 1.2 (ref 0.8–1.2)
Prothrombin Time: 14.6 seconds (ref 11.4–15.2)

## 2020-04-01 LAB — URINE DRUG SCREEN, QUALITATIVE (ARMC ONLY)
Amphetamines, Ur Screen: NOT DETECTED
Barbiturates, Ur Screen: NOT DETECTED
Benzodiazepine, Ur Scrn: POSITIVE — AB
Cannabinoid 50 Ng, Ur ~~LOC~~: NOT DETECTED
Cocaine Metabolite,Ur ~~LOC~~: NOT DETECTED
MDMA (Ecstasy)Ur Screen: NOT DETECTED
Methadone Scn, Ur: NOT DETECTED
Opiate, Ur Screen: POSITIVE — AB
Phencyclidine (PCP) Ur S: NOT DETECTED
Tricyclic, Ur Screen: NOT DETECTED

## 2020-04-01 LAB — ZINC: Zinc: 65 ug/dL (ref 44–115)

## 2020-04-01 LAB — LACTATE DEHYDROGENASE: LDH: 149 U/L (ref 98–192)

## 2020-04-01 LAB — URIC ACID: Uric Acid, Serum: 2.6 mg/dL — ABNORMAL LOW (ref 3.7–8.6)

## 2020-04-01 LAB — FIBRIN DERIVATIVES D-DIMER (ARMC ONLY): Fibrin derivatives D-dimer (ARMC): 6018.08 ng/mL (FEU) — ABNORMAL HIGH (ref 0.00–499.00)

## 2020-04-01 LAB — APTT: aPTT: 40 seconds — ABNORMAL HIGH (ref 24–36)

## 2020-04-01 LAB — MAGNESIUM: Magnesium: 1.9 mg/dL (ref 1.7–2.4)

## 2020-04-01 LAB — INTRINSIC FACTOR ANTIBODIES: Intrinsic Factor: 0.9 AU/mL (ref 0.0–1.1)

## 2020-04-01 LAB — FIBRINOGEN: Fibrinogen: 761 mg/dL — ABNORMAL HIGH (ref 210–475)

## 2020-04-01 LAB — PHOSPHORUS: Phosphorus: 2.9 mg/dL (ref 2.5–4.6)

## 2020-04-01 SURGERY — REPAIR, HERNIA, UMBILICAL, ADULT
Anesthesia: General

## 2020-04-01 MED ORDER — PROPOFOL 10 MG/ML IV BOLUS
INTRAVENOUS | Status: AC
  Filled 2020-04-01: qty 40

## 2020-04-01 MED ORDER — ONDANSETRON HCL 4 MG/2ML IJ SOLN
INTRAMUSCULAR | Status: AC
  Filled 2020-04-01: qty 2

## 2020-04-01 MED ORDER — THIAMINE HCL 100 MG PO TABS: 100.0000 mg | ORAL_TABLET | Freq: Every day | ORAL | Status: DC

## 2020-04-01 MED ORDER — DEXAMETHASONE SODIUM PHOSPHATE 10 MG/ML IJ SOLN
INTRAMUSCULAR | Status: AC
  Filled 2020-04-01: qty 1

## 2020-04-01 MED ORDER — MIDAZOLAM HCL 2 MG/2ML IJ SOLN
INTRAMUSCULAR | Status: AC
  Filled 2020-04-01: qty 2

## 2020-04-01 MED ORDER — HEPARIN SOD (PORK) LOCK FLUSH 100 UNIT/ML IV SOLN
INTRAVENOUS | Status: AC
  Filled 2020-04-01: qty 5

## 2020-04-01 MED ORDER — SODIUM CHLORIDE 0.9 % IV SOLN: INTRAVENOUS | Status: DC

## 2020-04-01 MED ORDER — ADULT MULTIVITAMIN W/MINERALS CH: 1.0000 | ORAL_TABLET | Freq: Every day | ORAL | Status: DC

## 2020-04-01 MED ORDER — LIDOCAINE HCL (PF) 2 % IJ SOLN
INTRAMUSCULAR | Status: AC
  Filled 2020-04-01: qty 5

## 2020-04-01 MED ORDER — FENTANYL CITRATE (PF) 100 MCG/2ML IJ SOLN
INTRAMUSCULAR | Status: AC
  Filled 2020-04-01: qty 2

## 2020-04-01 MED ORDER — FENTANYL CITRATE (PF) 100 MCG/2ML IJ SOLN
INTRAMUSCULAR | Status: AC | PRN
  Administered 2020-04-01: 50 ug via INTRAVENOUS

## 2020-04-01 MED ORDER — FOLIC ACID 1 MG PO TABS: 1.0000 mg | ORAL_TABLET | Freq: Every day | ORAL | Status: DC

## 2020-04-01 MED ORDER — MIDAZOLAM HCL 5 MG/5ML IJ SOLN
INTRAMUSCULAR | Status: AC | PRN
  Administered 2020-04-01: 2 mg via INTRAVENOUS

## 2020-04-01 MED ORDER — MIDAZOLAM HCL 5 MG/5ML IJ SOLN
INTRAMUSCULAR | Status: AC
  Filled 2020-04-01: qty 5

## 2020-04-01 SURGICAL SUPPLY — 26 items
BLADE SURG 15 STRL LF DISP TIS (BLADE) ×1 IMPLANT
BLADE SURG 15 STRL SS (BLADE) ×1
CANISTER SUCT 1200ML W/VALVE (MISCELLANEOUS) ×2 IMPLANT
CHLORAPREP W/TINT 26 (MISCELLANEOUS) ×2 IMPLANT
COVER WAND RF STERILE (DRAPES) ×2 IMPLANT
DERMABOND ADVANCED (GAUZE/BANDAGES/DRESSINGS) ×1
DERMABOND ADVANCED .7 DNX12 (GAUZE/BANDAGES/DRESSINGS) ×1 IMPLANT
DRAPE LAPAROTOMY 77X122 PED (DRAPES) ×2 IMPLANT
ELECT REM PT RETURN 9FT ADLT (ELECTROSURGICAL) ×2
ELECTRODE REM PT RTRN 9FT ADLT (ELECTROSURGICAL) ×1 IMPLANT
GLOVE SURG SYN 7.0 (GLOVE) ×2 IMPLANT
GLOVE SURG SYN 7.5  E (GLOVE) ×1
GLOVE SURG SYN 7.5 E (GLOVE) ×1 IMPLANT
GOWN STRL REUS W/ TWL LRG LVL3 (GOWN DISPOSABLE) ×3 IMPLANT
GOWN STRL REUS W/TWL LRG LVL3 (GOWN DISPOSABLE) ×3
NEEDLE HYPO 22GX1.5 SAFETY (NEEDLE) ×2 IMPLANT
NS IRRIG 500ML POUR BTL (IV SOLUTION) ×2 IMPLANT
PACK BASIN MINOR (MISCELLANEOUS) ×2 IMPLANT
SUT ETHIBOND 0 MO6 C/R (SUTURE) ×2 IMPLANT
SUT MNCRL AB 4-0 PS2 18 (SUTURE) ×2 IMPLANT
SUT VIC AB 2-0 SH 27 (SUTURE) ×1
SUT VIC AB 2-0 SH 27XBRD (SUTURE) ×1 IMPLANT
SUT VIC AB 3-0 SH 27 (SUTURE) ×1
SUT VIC AB 3-0 SH 27X BRD (SUTURE) ×1 IMPLANT
SYR 20ML LL LF (SYRINGE) ×2 IMPLANT
SYR BULB IRRIG 60ML STRL (SYRINGE) ×2 IMPLANT

## 2020-04-01 NOTE — Progress Notes (Signed)
[facility] Hematology/Oncology Progress Note  Date of admission: 03/29/2020  Hospital day:  04/01/2020  Chief Complaint: Carl [name]. is a 45 y.o. male who was admitted through the emergency room with neutropenic fever.  Subjective:  Patient notes abdominal pain (no change) in umbilical region.  Bone marrow uneventful.  Social History: The patient is accompanied by his girlfriend today.  Allergies: No Known Allergies  Scheduled Medications: . fentaNYL      . folic acid  1 mg Oral Daily  . heparin lock flush      . midazolam      . multivitamin with minerals  1 tablet Oral Daily  . thiamine  100 mg Oral Daily   Or  . thiamine  100 mg Intravenous Daily    Review of Systems  Constitutional: Negative for chills, fever, malaise/fatigue and weight loss.       Feels "ok".  HENT: Negative for congestion, ear discharge, ear pain, nosebleeds, sinus pain and sore throat.   Eyes: Negative.  Negative for blurred vision, double vision, photophobia and pain.  Respiratory: Negative.  Negative for cough, hemoptysis, sputum production, shortness of breath and wheezing.   Cardiovascular: Negative.  Negative for chest pain, palpitations, orthopnea, leg swelling and PND.  Gastrointestinal: Negative.  Negative for blood in stool, constipation, diarrhea, melena, nausea and vomiting.       Discomfort/pain in umbilical region.  Genitourinary: Negative.  Negative for dysuria, frequency, hematuria and urgency.  Musculoskeletal: Negative.  Negative for back pain, joint pain, myalgias and neck pain.  Skin: Negative for itching and rash.       Umbilical region still pink.  Neurological: Negative.  Negative for dizziness, tingling, tremors, sensory change, speech change, focal weakness, weakness and headaches.  Endo/Heme/Allergies: Negative.  Does not bruise/bleed easily.  Psychiatric/Behavioral: Negative.  Negative for depression, memory loss and suicidal ideas. The patient is not  nervous/anxious and does not have insomnia.    Vitals: Blood pressure 130/75, pulse 80, temperature 98.2 F (36.8 C), temperature source Oral, resp. rate 18, height '5\' 9"'  (1.753 m), weight 177 lb 14.6 oz (80.7 kg), SpO2 100 %.   Physical Exam Constitutional:      General: He is not in acute distress.    Appearance: He is well-developed. He is not ill-appearing or diaphoretic.  HENT:     Head: Normocephalic and atraumatic.     Comments: Short black hair and mustache.    Mouth/Throat:     Mouth: Mucous membranes are moist.     Pharynx: Oropharynx is clear. No oropharyngeal exudate.  Eyes:     General: No scleral icterus.    Extraocular Movements: Extraocular movements intact.     Pupils: Pupils are equal, round, and reactive to light.     Comments: Newmann eyes.  Cardiovascular:     Rate and Rhythm: Normal rate and regular rhythm.     Heart sounds: Normal heart sounds. No murmur heard.  No gallop.   Pulmonary:     Effort: Pulmonary effort is normal. No respiratory distress.     Breath sounds: Normal breath sounds. No wheezing or rales.  Chest:     Chest wall: No tenderness.  Abdominal:     General: Abdomen is flat. Bowel sounds are normal. There is no distension.     Palpations: There is no hepatomegaly or splenomegaly.     Tenderness: There is no guarding or rebound.     Hernia: A hernia is present.     Comments:  Periumbilical region tender and pink inferiorly.  No fluctuance.  Skin:    General: Skin is warm and dry.     Coloration: Skin is not jaundiced or pale.  Neurological:     General: No focal deficit present.     Mental Status: He is alert and oriented to person, place, and time.  Psychiatric:        Mood and Affect: Mood normal.        Behavior: Behavior normal.     Results for orders placed or performed during the hospital encounter of 03/29/20 (from the past 48 hour(s))  Lipase, blood     Status: None   Collection Time: 03/31/20  5:43 AM  Result Value Ref  Range   Lipase 25 11 - 51 U/L    Comment: Performed at [facility], [address] [place], [place] [address]  CBC with Differential/Platelet     Status: Abnormal   Collection Time: 03/31/20  5:43 AM  Result Value Ref Range   WBC 0.4 (LL) 4.0 - 10.5 K/uL    Comment: CRITICAL VALUE NOTED.  VALUE IS CONSISTENT WITH PREVIOUSLY REPORTED AND CALLED VALUE.   RBC 2.59 (L) 4.22 - 5.81 MIL/uL   Hemoglobin 9.5 (L) 13.0 - 17.0 g/dL   HCT 27.2 (L) 39 - 52 %   MCV 105.0 (H) 80.0 - 100.0 fL   MCH 36.7 (H) 26.0 - 34.0 pg   MCHC 34.9 30.0 - 36.0 g/dL   RDW 13.2 11.5 - 15.5 %   Platelets 82 (L) 150 - 400 K/uL    Comment: Immature Platelet Fraction may be clinically indicated, consider ordering this additional test [id number]    nRBC 0.0 0.0 - 0.2 %   Neutrophils Relative % 35 %   Neutro Abs 0.1 (L) 1.7 - 7.7 K/uL   Lymphocytes Relative 59 %   Lymphs Abs 0.2 (L) 0.7 - 4.0 K/uL   Monocytes Relative 3 %   Monocytes Absolute 0.0 (L) 0 - 1 K/uL   Eosinophils Relative 3 %   Eosinophils Absolute 0.0 0 - 0 K/uL   Basophils Relative 0 %   Basophils Absolute 0.0 0 - 0 K/uL   WBC Morphology MORPHOLOGY UNREMARKABLE    RBC Morphology MORPHOLOGY UNREMARKABLE    Smear Review Normal platelet morphology    Immature Granulocytes 0 %   Abs Immature Granulocytes 0.00 0.00 - 0.07 K/uL    Comment: Performed at [facility], [address] [place], [place] [address]  Basic metabolic panel     Status: Abnormal   Collection Time: 03/31/20  5:43 AM  Result Value Ref Range   Sodium 132 (L) 135 - 145 mmol/L   Potassium 3.4 (L) 3.5 - 5.1 mmol/L   Chloride 98 98 - 111 mmol/L   CO2 26 22 - 32 mmol/L   Glucose, Bld 110 (H) 70 - 99 mg/dL    Comment: Glucose reference range applies only to samples taken after fasting for at least 8 hours.   BUN 6 6 - 20 mg/dL   Creatinine, Ser 0.79 0.61 - 1.24 mg/dL   Calcium 8.5 (L) 8.9 - 10.3 mg/dL   GFR calc non Af Amer >60 >60 mL/min   GFR calc Af Amer >60  >60 mL/min   Anion gap 8 5 - 15    Comment: Performed at [facility], [address], [place], [place] [address]  Phosphorus     Status: None   Collection Time: 03/31/20  5:43 AM  Result Value Ref Range   Phosphorus 2.5 2.5 - 4.6 mg/dL    Comment: Performed at [facility], [address] [place], [place] [address]  Magnesium     Status: None   Collection Time: 03/31/20  5:43 AM  Result Value Ref Range   Magnesium 2.1 1.7 - 2.4 mg/dL    Comment: Performed at [facility], [address] [place], [place] [address]  Hepatitis C antibody     Status: None   Collection Time: 03/31/20  5:43 AM  Result Value Ref Range   HCV Ab NON REACTIVE NON REACTIVE    Comment: (NOTE) Nonreactive HCV antibody screen is consistent with no HCV infections,  unless recent infection is suspected or other evidence exists to indicate HCV infection.  Performed at [facility], [place] [address], [place], [place] [address]   Reticulocytes     Status: Abnormal   Collection Time: 03/31/20  5:44 AM  Result Value Ref Range   Retic Ct Pct 2.0 0.4 - 3.1 %   RBC. 2.59 (L) 4.22 - 5.81 MIL/uL   Retic Count, Absolute 50.8 19.0 - 186.0 K/uL   Immature Retic Fract 15.9 2.3 - 15.9 %    Comment: Performed at [facility], [address] [place], [place] [address]  CULTURE, BLOOD (ROUTINE X 2) w Reflex to ID Panel     Status: None (Preliminary result)   Collection Time: 03/31/20  1:20 PM   Specimen: BLOOD  Result Value Ref Range   Specimen Description BLOOD LEFT AC    Special Requests      BOTTLES DRAWN AEROBIC AND ANAEROBIC Blood Culture adequate volume   Culture      NO GROWTH < 24 HOURS Performed at [facility], [address] [place], [place] [address]    Report Status PENDING   CULTURE, BLOOD (ROUTINE X 2) w Reflex to ID Panel     Status: None (Preliminary result)   Collection Time: 03/31/20  1:23 PM   Specimen: BLOOD  Result Value Ref Range    Specimen Description BLOOD RIGHT AC    Special Requests      BOTTLES DRAWN AEROBIC AND ANAEROBIC Blood Culture adequate volume   Culture      NO GROWTH < 24 HOURS Performed at [facility], [address] [place],  [address]    Report Status PENDING   CBC with Differential/Platelet     Status: Abnormal   Collection Time: 04/01/20  7:08 AM  Result Value Ref Range   WBC 0.5 (LL) 4.0 - 10.5 K/uL    Comment: CRITICAL VALUE NOTED.  VALUE IS CONSISTENT WITH PREVIOUSLY REPORTED AND CALLED VALUE.   RBC 2.52 (L) 4.22 - 5.81 MIL/uL   Hemoglobin 9.1 (L) 13.0 - 17.0 g/dL   HCT 26.6 (L) 39 - 52 %   MCV 105.6 (H) 80.0 - 100.0 fL   MCH 36.1 (H) 26.0 - 34.0 pg   MCHC 34.2 30.0 - 36.0 g/dL   RDW 13.5 11.5 - 15.5 %   Platelets 100 (L) 150 - 400 K/uL    Comment: Immature Platelet Fraction may be clinically indicated, consider ordering this additional test [id number]    nRBC 0.0 0.0 - 0.2 %   Neutrophils Relative % 23 %   Neutro Abs 0.1 (L) 1.7 - 7.7 K/uL   Lymphocytes Relative 75 %   Lymphs Abs 0.4 (L) 0.7 - 4.0 K/uL   Monocytes Relative 2 %   Monocytes  Absolute 0.0 (L) 0 - 1 K/uL   Eosinophils Relative 0 %   Eosinophils Absolute 0.0 0 - 0 K/uL   Basophils Relative 0 %   Basophils Absolute 0.0 0 - 0 K/uL   WBC Morphology MORPHOLOGY UNREMARKABLE    Smear Review Normal platelet morphology    Immature Granulocytes 0 %   Abs Immature Granulocytes 0.00 0.00 - 0.07 K/uL   Polychromasia PRESENT     Comment: Performed at [facility], [address], [place], [place] [address]  Basic metabolic panel     Status: Abnormal   Collection Time: 04/01/20  7:08 AM  Result Value Ref Range   Sodium 132 (L) 135 - 145 mmol/L   Potassium 3.8 3.5 - 5.1 mmol/L   Chloride 98 98 - 111 mmol/L   CO2 25 22 - 32 mmol/L   Glucose, Bld 97 70 - 99 mg/dL    Comment: Glucose reference range applies only to samples taken after fasting for at least 8 hours.   BUN 5 (L) 6 - 20 mg/dL    Creatinine, Ser 0.90 0.61 - 1.24 mg/dL   Calcium 8.4 (L) 8.9 - 10.3 mg/dL   GFR calc non Af Amer >60 >60 mL/min   GFR calc Af Amer >60 >60 mL/min   Anion gap 9 5 - 15    Comment: Performed at [facility], [address], [place], [place] [address]  Urine Drug Screen, Qualitative ([place] only)     Status: Abnormal   Collection Time: 04/01/20  2:02 PM  Result Value Ref Range   Tricyclic, Ur Screen NONE DETECTED NONE DETECTED   Amphetamines, Ur Screen NONE DETECTED NONE DETECTED   MDMA (Ecstasy)Ur Screen NONE DETECTED NONE DETECTED   Cocaine Metabolite,Ur [place] NONE DETECTED NONE DETECTED   Opiate, Ur Screen POSITIVE (A) NONE DETECTED   Phencyclidine (PCP) Ur S NONE DETECTED NONE DETECTED   Cannabinoid 50 Ng, Ur [place] NONE DETECTED NONE DETECTED   Barbiturates, Ur Screen NONE DETECTED NONE DETECTED   Benzodiazepine, Ur Scrn POSITIVE (A) NONE DETECTED   Methadone Scn, Ur NONE DETECTED NONE DETECTED    Comment: (NOTE) Tricyclics + metabolites, urine    Cutoff 1000 ng/mL Amphetamines + metabolites, urine  Cutoff 1000 ng/mL MDMA (Ecstasy), urine              Cutoff 500 ng/mL Cocaine Metabolite, urine          Cutoff 300 ng/mL Opiate + metabolites, urine        Cutoff 300 ng/mL Phencyclidine (PCP), urine         Cutoff 25 ng/mL Cannabinoid, urine                 Cutoff 50 ng/mL Barbiturates + metabolites, urine  Cutoff 200 ng/mL Benzodiazepine, urine              Cutoff 200 ng/mL Methadone, urine                   Cutoff 300 ng/mL  The urine drug screen provides only a preliminary, unconfirmed analytical test result and should not be used for non-medical purposes. Clinical consideration and professional judgment should be applied to any positive drug screen result due to possible interfering substances. A more specific alternate chemical method must be used in order to obtain a confirmed analytical result. Gas chromatography / mass spectrometry (GC/MS) is the preferred confirm atory  method. Performed at [facility], [address] [place], [place] [address]  Protime-INR     Status: None   Collection Time: 04/01/20  3:24 PM  Result Value Ref Range   Prothrombin Time 14.6 11.4 - 15.2 seconds   INR 1.2 0.8 - 1.2    Comment: (NOTE) INR goal varies based on device and disease states. Performed at [facility], [address] [place], [place] [address]   APTT     Status: Abnormal   Collection Time: 04/01/20  3:24 PM  Result Value Ref Range   aPTT 40 (H) 24 - 36 seconds    Comment:        IF BASELINE aPTT IS ELEVATED, SUGGEST PATIENT RISK ASSESSMENT BE USED TO DETERMINE APPROPRIATE ANTICOAGULANT THERAPY. Performed at [facility], [address] [place], [place] [address]   Fibrinogen     Status: Abnormal   Collection Time: 04/01/20  3:24 PM  Result Value Ref Range   Fibrinogen 761 (H) 210 - 475 mg/dL    Comment: Performed at [facility], [address] [place], [place] [address]  Fibrin derivatives D-Dimer [facility] only)     Status: Abnormal   Collection Time: 04/01/20  3:24 PM  Result Value Ref Range   Fibrin derivatives D-dimer ([facility]) 6,018.08 (H) 0.00 - 499.00 ng/mL (FEU)    Comment: (NOTE) <> Exclusion of Venous Thromboembolism (VTE) - OUTPATIENT ONLY   (Emergency Department or [place])    0-499 ng/ml (FEU): With a low to intermediate pretest probability                      for VTE this test result excludes the diagnosis                      of VTE.   >499 ng/ml (FEU) : VTE not excluded; additional work up for VTE is                      required.  <> Testing on Inpatients and Evaluation of Disseminated Intravascular   Coagulation (DIC) Reference Range:   0-499 ng/ml (FEU) Performed at [facility], [address] [place], [place] [address]   Uric acid     Status: Abnormal   Collection Time: 04/01/20  3:24 PM  Result Value Ref Range   Uric Acid, Serum 2.6 (L) 3.7 - 8.6 mg/dL    Comment:  Performed at [facility], [address] [place], [place] [address]  Phosphorus     Status: None   Collection Time: 04/01/20  3:24 PM  Result Value Ref Range   Phosphorus 2.9 2.5 - 4.6 mg/dL    Comment: Performed at [facility], [address] [place], [place] [address]  Magnesium     Status: None   Collection Time: 04/01/20  3:24 PM  Result Value Ref Range   Magnesium 1.9 1.7 - 2.4 mg/dL    Comment: Performed at [facility], [address] [place], [place] [address]  Lactate dehydrogenase     Status: None   Collection Time: 04/01/20  3:24 PM  Result Value Ref Range   LDH 149 98 - 192 U/L    Comment: Performed at [facility], [address] [place], [place] [address]    [place]  Addendum Date: 04/01/2020   ADDENDUM REPORT: 04/01/2020 01:39 ADDENDUM: Images were reviewed in consultation with the referring provider with specific attention to the periumbilical region. As indicated, there is a small  periumbilical hernia containing fat without bowel herniation. Mild infiltration in the herniated fat is nonspecific but could indicate fat necrosis in the appropriate clinical setting. No fluid collection to suggest an abscess or hematoma. Electronically Signed   By: Lucienne Capers M.D.   On: 04/01/2020 01:39   Result Date: 04/01/2020 CLINICAL DATA:  Increasing abdominal pain today. Suspected abdominal abscess or infection EXAM: CT ABDOMEN AND PELVIS WITH CONTRAST TECHNIQUE: Multidetector CT imaging of the abdomen and pelvis was performed using the standard protocol following bolus administration of intravenous contrast. CONTRAST:  161m OMNIPAQUE IOHEXOL 300 MG/ML  SOLN COMPARISON:  03/29/2020 FINDINGS: Lower chest: Mild dependent atelectasis in the lung bases. Hepatobiliary: No focal liver abnormality is seen. No gallstones, gallbladder wall thickening, or biliary dilatation. Pancreas: Mild pancreatic ductal dilatation. Homogeneous  normal appearance of pancreatic parenchyma. No edema or inflammatory changes. Ductal dilatation may indicate early change of acute pancreatitis. Given the patient's history of increasing pain, consider laboratory investigation for pancreatitis. Spleen: Normal in size without focal abnormality. Adrenals/Urinary Tract: Adrenal glands are unremarkable. Kidneys are normal, without renal calculi, focal lesion, or hydronephrosis. Bladder is unremarkable. Stomach/Bowel: Stomach is within normal limits. Appendix appears normal. No evidence of bowel wall thickening, distention, or inflammatory changes. Vascular/Lymphatic: No significant vascular findings are present. No enlarged abdominal or pelvic lymph nodes. Reproductive: Prostate is unremarkable. Other: Small periumbilical hernia containing fat. No bowel herniation. No abdominopelvic ascites. Musculoskeletal: No acute or significant osseous findings. IMPRESSION: 1. Mild pancreatic ductal dilatation. No edema or inflammatory changes. Given the patient's history of increasing pain, consider laboratory investigation for pancreatitis. 2. No evidence of bowel obstruction or inflammation. 3. Small periumbilical hernia containing fat. No bowel herniation. Electronically Signed: By: WLucienne CapersM.D. On: 04/01/2020 00:01   CT BONE MARROW BIOPSY & ASPIRATION  Result Date: 04/01/2020 INDICATION: PANCYTOPENIA EXAM: CT GUIDED RIGHT ILIAC BONE MARROW ASPIRATION AND CORE BIOPSY Date:  04/01/2020 04/01/2020 11:44 am Radiologist:  MJerilynn Mages TDaryll Brod MD Guidance:  CT FLUOROSCOPY TIME:  Fluoroscopy Time: None. MEDICATIONS: 1% line ANESTHESIA/SEDATION: 2.0 mg IV Versed; 50 mcg IV Fentanyl Moderate Sedation Time:  10 minutes The patient was continuously monitored during the procedure by the interventional radiology nurse under my direct supervision. CONTRAST:  None. COMPLICATIONS: None PROCEDURE: Informed consent was obtained from the patient following explanation of the procedure, risks,  benefits and alternatives. The patient understands, agrees and consents for the procedure. All questions were addressed. A time out was performed. The patient was positioned prone and non-contrast localization CT was performed of the pelvis to demonstrate the iliac marrow spaces. Maximal barrier sterile technique utilized including caps, mask, sterile gowns, sterile gloves, large sterile drape, hand hygiene, and Betadine prep. Under sterile conditions and local anesthesia, an 11 gauge coaxial bone biopsy needle was advanced into the right iliac marrow space. Needle position was confirmed with CT imaging. Initially, bone marrow aspiration was performed. Next, the 11 gauge outer cannula was utilized to obtain a right iliac bone marrow core biopsy. Needle was removed. Hemostasis was obtained with compression. The patient tolerated the procedure well. Samples were prepared with the cytotechnologist. No immediate complications. IMPRESSION: CT guided right iliac bone marrow aspiration and core biopsy. Electronically Signed   By: MJerilynn Mages  Shick M.D.   On: 04/01/2020 11:53    Assessment:  Yadir BGlenard Keesling is a 44y.o. male with pancytopenia admitted with fever and abdominal pain.  He binge drinks from Friday-Sunday every other week.  He denies any new medications or herbal products.  He denies recurrent infections or gingivitis.  Normal labs include:  creatinine, LFTs, folate, ferritin with iron studies, HIV antibody, LDH, lactic acid, PT, PTT, ANA, hepatitis B antibody, hepatitis C antibody, HIV antibody, and COVID-19 testing.  Copper 143 (69-132).  Peripheral smear revealed pancytopenia.  Morphology of RBCs, WBCs and platelets were within normal limits.  There were no circulating blasts or schistocytes.  Flow cytometry pending.  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  He has B12 deficiency.  B12 was 166 (low) and folate 9.2.  He received B12 on 03/29/2020.  Blood ID panel reveals streptococcus.   Blood culture on 03/29/2020 revealed streptococcus group G.  He is on Unasyn.  Plan:   1.   Pancytopenia             Hematocrit 32.1.  Hemoglobin 10.7.  MCV 108.4.  Platelets   98,000.  WBC 500 on 03/28/2020.  Hematocrit 31.2.  Hemoglobin 10.5.  MCV 108.3.  Platelets 108,000.  WBC 700 on 03/29/2020.  Hematocrit 28.5.  Hemoglobin   9.5.  MCV 107.1.  Platelets   88,000.  WBC 500 (ANC 200) on 03/30/2020  Hematocrit 27.2.  Hemoglobin   9.5.  MCV 105.0.  Platelets   82,000.  WBC 400 ([place] 100) on 03/31/2020.  Hematocrit 26.6.  Hemoglobin   9.1.  MCV 195.6.  Platelets  100,00.   WBC 500 ([place] 100) on 04/01/2020.             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   Reviewed diagnosis with patient.  Review treatment.    Multiple questions asked and answered.   Discuss plans to transfer to [facility].  Patient in agreement.   Transfer initially postponed secondary to awaiting final identification of leukemia.            Follow-up flow cytometry. 2.   Fever and neutropenia             Patient afebrile on Unasyn.             Blood culture notes streptococcus group G (? GI source).             Fever and neutropenia precuations continue. 3.   Abdominal pain  Etiology initially felt secondary to pancreatitis and possibly GI ulcer.   Lipase was 225 on 08/03/2021and 25 yesterday.   Patient remains on a clear liquid diet (NPO now).  Pain localized to periumbilical region with associated mild erythema and edema yesterday.   Initial CT reviewed with radiology.   CT was repeated last night- mild interval inflammation with peritoneal fat into small hernia defect.  Bowel apparently not involved.  Patient has seen Dr Hampton Abbot about plans for surgery today.  Patient clinically stable.   Discuss involvement with surgery service at [facility]. 4.   Disposition  Patient now accepted to [facility] for transfer under the care of Dr Sabino Dick.  Will discuss with Dr Sabino Dick.   A total of (>90) minutes  of face-to-face time including coordination of care (interventional radiology, pathology, diagnostic radiology for image sharing, nursing, surgery, transfer center) was spent with the patient with greater than 50% of that time in counseling and care-coordination.    Lequita Asal, MD  04/01/2020, 6:28 PM

## 2020-04-01 NOTE — Procedures (Signed)
Interventional Radiology Procedure Note ° °Procedure: CT BM ASP AND CORE   ° °Complications: None ° °Estimated Blood Loss:  MIN ° °Findings: °11 G CORE AND BX   ° °M. TREVOR Arisa Congleton, MD ° ° ° °

## 2020-04-01 NOTE — Anesthesia Preprocedure Evaluation (Deleted)
Anesthesia Evaluation  Patient identified by MRN, date of birth, ID band Patient awake    Reviewed: Allergy & Precautions, H&P , NPO status , [name] Chart, lab work & pertinent test results, reviewed documented beta blocker date and time   Airway Mallampati: II  TM Distance: >3 FB Neck ROM: full    Dental  (+) Teeth Intact   Pulmonary neg pulmonary ROS, Current Smoker,    Pulmonary exam normal        Cardiovascular Exercise Tolerance: Poor negative cardio ROS Normal cardiovascular exam Rhythm:regular Rate:Normal     Neuro/Psych negative neurological ROS  negative psych ROS   GI/Hepatic negative GI ROS, Neg liver ROS,   Endo/Other  negative endocrine ROS  Renal/GU negative Renal ROS  negative genitourinary   Musculoskeletal   Abdominal   Peds  Hematology negative hematology ROS (+)   Anesthesia Other Findings History reviewed. No pertinent past medical history. History reviewed. No pertinent surgical history. BMI    Body Mass Index: 26.27 kg/m     Reproductive/Obstetrics negative OB ROS                             Anesthesia Physical Anesthesia Plan  ASA: II and emergent  Anesthesia Plan: General ETT   Post-op Pain Management:    Induction:   PONV Risk Score and Plan:   Airway Management Planned:   Additional Equipment:   Intra-op Plan:   Post-operative Plan:   Informed Consent: I have reviewed the patients History and Physical, chart, labs and discussed the procedure including the risks, benefits and alternatives for the proposed anesthesia with the patient or authorized representative who has indicated his/her understanding and acceptance.     Dental Advisory Given  Plan Discussed with: CRNA  Anesthesia Plan Comments:         Anesthesia Quick Evaluation

## 2020-04-01 NOTE — Progress Notes (Signed)
    BRIEF OVERNIGHT PROGRESS REPORT    SUBJECTIVE: Patient continues to c/o abdominal pain worse in the umbilical region. Area is tender to touch and warm. He is very concerned and wants further investigation or possible transfer to higher institution.  OBJECTIVE: On bedside assessment, he was afebrile with blood pressure 107/59 mm Hg and pulse rate 69 beats/min. There were no focal neurological deficits; he was alert and oriented x4. Pertinent assessment as below. Umbilicus area tender to touch.     ASSESSMENT:44 y.o man with pancytopenia admitted on with fever and abdominal pain.  PLAN: Fever with Pancytopenia and Abdominal Pain persistent or recurrent fever >4 days without clear source, Tmax 102.3, ANC 100,  Localizing Sources: Unclear but now with redness around the periumbilical  region concerning for cellulitis versus umbilical abscess? -CT Abdomen/pelvis on 8/3 small fat containing umbilical hernia without suspicious features for incarceration. -Repeat CT Abdomen/pelvis 8/6 again showed small periumbilical hernia containing fat without bowel herniation. Mild infiltration in the  herniated fat is nonspecific but could indicate fat necrosis. No fluid collection to suggest an abscess or hematoma. - Neutropenic precautions - Blood culture ID panel revealed Streptococcus species concerning for GI source per ID - Follow-up blood+urine cultures - Empiric abx with Unasyn - Given persistent pain in the umbilicus area with redness as above concerning for possible fat necrosis/abcess? Will obtain surgical consult for further evaluation - Further work up per ID/Oncology. Appreciate ID and Oncology input      Rufina Falco BSN, MSN, DNP,CCRN, FNP-BC  [facility] Nurse Practitioner  [facility]?

## 2020-04-01 NOTE — Progress Notes (Signed)
Recovery nurse informed this Probation officer at 1154 she gave pt two 4oz cups of juice after CT/bone marrow biopsy. Notified OR Message will be relayed to surgeon.

## 2020-04-01 NOTE — Progress Notes (Signed)
Patient transferred to [facility] unit 4 Oncology via [place] at 2330.  Report given to [facility]. Patient VS WNL.

## 2020-04-01 NOTE — Progress Notes (Signed)
ID Had bone marrow biopsy today No fever Pain umbilical area present Repeat Ct last night  small periumbilical hernia containing fat without bowel herniation. Mild infiltration in the herniated fat is nonspecific but could indicate fat necrosis in the appropriate clinical setting.No fluid collection to suggest an abscess or hematoma.      Patient Vitals for the past 24 hrs:  BP Temp Temp src Pulse Resp SpO2 Height Weight  04/01/20 1027 132/69 -- -- 74 18 100 % -- --  04/01/20 1013 132/78 98 F (36.7 C) Oral 84 14 98 % '5\' 9"'  (1.753 m) 80.7 kg  04/01/20 0753 (!) 105/57 (!) 97.5 F (36.4 C) Oral 63 16 100 % -- --  04/01/20 0508 (!) 104/59 98.8 F (37.1 C) Oral 74 16 97 % -- --  04/01/20 0006 122/62 98.2 F (36.8 C) Oral 77 18 100 % -- --  03/31/20 2052 (!) 107/59 98.2 F (36.8 C) Oral 69 15 100 % -- --  03/31/20 1611 133/69 98.4 F (36.9 C) -- 76 16 98 % -- --  03/31/20 1149 117/66 98 F (36.7 C) Oral 62 16 99 % -- --   O/e awake and alert No distress    Chest CTA HS s1s2 CNS non focal   CBC Latest Ref Rng & Units 04/01/2020 03/31/2020 03/30/2020  WBC 4.0 - 10.5 K/uL 0.5(LL) 0.4(LL) 0.5(LL)  Hemoglobin 13.0 - 17.0 g/dL 9.1(L) 9.5(L) 9.5(L)  Hematocrit 39 - 52 % 26.6(L) 27.2(L) 28.5(L)  Platelets 150 - 400 K/uL 100(L) 82(L) 88(L)    CMP Latest Ref Rng & Units 04/01/2020 03/31/2020 03/29/2020  Glucose 70 - 99 mg/dL 97 110(H) 110(H)  BUN 6 - 20 mg/dL 5(L) 6 9  Creatinine 0.61 - 1.24 mg/dL 0.90 0.79 1.05  Sodium 135 - 145 mmol/L 132(L) 132(L) 130(L)  Potassium 3.5 - 5.1 mmol/L 3.8 3.4(L) 3.8  Chloride 98 - 111 mmol/L 98 98 100  CO2 22 - 32 mmol/L '25 26 22  ' Calcium 8.9 - 10.3 mg/dL 8.4(L) 8.5(L) 8.4(L)  Total Protein 6.5 - 8.1 g/dL - - 7.5  Total Bilirubin 0.3 - 1.2 mg/dL - - 1.2  Alkaline Phos 38 - 126 U/L - - 75  AST 15 - 41 U/L - - 20  ALT 0 - 44 U/L - - 24    Micro 8/3 strep group G-1 of 4  Streptococcus group g    MIC    AMPICILLIN <=0.25 SENS... Sensitive     CEFTRIAXONE <=0.12 SENS... Sensitive    CLINDAMYCIN <=0.25 SENS... Sensitive    ERYTHROMYCIN <=0.12 SENS... Sensitive    LEVOFLOXACIN 0.5 SENSITIVE  Sensitive    VANCOMYCIN 0.5 SENSITIVE  Sensitive     8/5 BC NG  Impression/recommendation Febrile neutropenia- fever has resolved   Umbilical nodule - erythematous and tender- strangulated fatty hernia of the umbilicus Surgery seen patient and planning for repair  Group G strep bacteremia source likely GI- -- on  unasyn Need upper endo and colonoscopy once neutropenia and thrombocytopenia resolved  Pancytopenia ? B12 def ? Alcohol ? Aplastic anemia  Discussed the management with patient and his partner at bed side ID will follow him peripherally this weekend

## 2020-04-01 NOTE — Discharge Summary (Signed)
Discharge Summary  Carl [name]. OZH:[id number] DOB: 04/20/1976  PCP: Verl Bangs, FNP  Admit date: 03/29/2020 Discharge date: 04/01/2020  Time spent: 40 mins  Recommendations for Outpatient Follow-up:  1. PCP 2. Oncology  Discharge Diagnoses:  Active Hospital Problems   Diagnosis Date Noted  . Neutropenic fever ([place]) 03/29/2020  . Pancytopenia ([place]) 03/29/2020  . Pancreatitis 03/29/2020    Resolved Hospital Problems  No resolved problems to display.    Discharge Condition: Fair  Diet recommendation: Regular  Vitals:   04/01/20 1154 04/01/20 1614  BP: 120/67 130/75  Pulse: 70 80  Resp: 18 18  Temp: (!) 97.5 F (36.4 C) 98.2 F (36.8 C)  SpO2: 97% 100%    History of present illness:  Carl Garciais a 44 y.o.malewithno significant past medical history but who 3 months prior was noted to have WBC of 1100 and was referred to hematology but was unable to, who presents to the emergency room with a 1 month complaint of generalized malaise and a 1 week history of nonproductive cough abdominal pain mostly when coughing. Reports a low-grade temperature but no chills. Denies nausea vomiting or change in bowel habits. Denies chest pains or shortness of breath. In the ED, pt had a low-grade temperature of 100.2 with borderline tachycardia of 99 otherwise normal vitals. Blood work notable for WBC of 500, platelets 98,000 and hemoglobin 10.7. Lactic acid 1.1.Iron diminished at 40. Lipase 225. CT chest showed mild pulmonary vascular congestion, with no edema or consolidation. CT abdomen and pelvis showed no CT evidence for acute intra-abdominal or pelvic abnormality. Patient was started on broad-spectrum antibiotics with cefepime, Flagyl and vancomycin. Hospitalist consulted for admission.    Overnight, pt reported worsening abdominal pain, worse around the periumbilical region, concerning for strangulation of umbilical hernia. Gen surg consulted this a.m.,  and urgent surgery was planned.  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  Due to severe pancytopenia/neutropenia, oncologist Dr. Mike Gip arranged for patient to be transferred to [facility] for further management.  Surgical team also at [facility] were notified for possible urgent surgery.  Plan to transfer patient to [facility], as patient has been accepted and has a bed.    Hospital Course:  Principal Problem:   Neutropenic fever ([place]) Active Problems:   Pancytopenia ([place])   Pancreatitis   Neutropenic fever/abdominal pain Possible periumbilical hernia with fat necrosis Last temp spike 100.4 on 03/29/2020 Worsening leukopenia BC X2, anaerobic bottle growing Streptococcus group G (likely abdominal source as per GI) Will need upper endoscopy and colonoscopy once neutropenia and thrombocytopenia has significantly improved CMV pending, EBV noted -IgM CT abdomen/pelvis showed small periumbilical hernia containing fat without bowel herniation, but with possible fat necrosis General surgery consulted, planned for urgent surgery, currently being transferred to [facility] Continue IV Unasyn ID consulted Neutropenic precautions, monitor closely  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 HIV/Hepatitis B nonreactive, hepatitis C, ANA, zinc, copper level pending Noted vitamin B12 deficiency, continue supplementation Heme-onc consulted, appreciate recs, s/p bone marrow biopsy on 04/01/2020 which 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   Weight as of this encounter: 80.7 kg.    Procedures:  Bone marrow biopsy on 04/01/2020  Consultations:  Heme-onc  Infectious disease  General surgery  Discharge Exam: BP 130/75 (BP Location: Left Arm)   Pulse 80   Temp 98.2 F (36.8 C) (Oral)   Resp 18   Ht '5\' 9"'  (1.753 m)   Wt 80.7 kg   SpO2 100%   BMI 26.27 kg/m   General: NAD Cardiovascular: S1, S2 present Respiratory: CTA B Abdomen: Soft, periumbilical tenderness, nondistended, bowel sounds present    Discharge Instructions You were cared for by a hospitalist during your hospital stay. If you have any questions about your discharge medications or the care you received while you were in the hospital after you are discharged, you can call the unit and asked to speak with the hospitalist on call if the hospitalist that took care of you is not available. Once you are discharged, your primary care physician will handle any further medical issues. Please note that NO REFILLS for any discharge medications will be authorized once you are discharged, as it is imperative that you return to your primary care physician (or establish a relationship with a primary care physician if you do not have one) for your aftercare needs so that they can reassess your need for medications and monitor your lab values.  Discharge Instructions    Diet - low sodium heart healthy   Complete by: As directed    Discharge wound care:   Complete by: As directed    Monitor bone marrow biopsy site daily   Increase activity slowly   Complete by: As directed      Allergies as of 04/01/2020   No Known Allergies     Medication List    TAKE these medications   folic acid 1 MG tablet Commonly known as: FOLVITE Take 1 tablet (1 mg total) by mouth daily. Start taking on: April 02, 2020   multivitamin with minerals Tabs tablet Take 1 tablet by  mouth daily. Start taking on: April 02, 2020   thiamine 100 MG tablet Take 1 tablet (100 mg total) by mouth daily. Start taking on: April 02, 2020            Discharge Care Instructions  (From admission, onward)         Start     Ordered   04/01/20 0000  Discharge wound care:       Comments: Monitor bone marrow biopsy site daily   04/01/20 1834         No Known Allergies  Follow-up Information    Verl Bangs, FNP Follow up on 04/21/2020.   Specialty: Family Medicine Why: New patient appointment scheduled on 8/26 at 1020 AM- arrive at least 15 before scheduled time and bring photo ID and insurance card Contact information: [place] [phone]                The results of significant diagnostics from this hospitalization (including imaging, microbiology, ancillary and laboratory) are listed below for reference.    Significant Diagnostic Studies: CT ABDOMEN PELVIS W CONTRAST  Addendum Date: 04/01/2020   ADDENDUM REPORT: 04/01/2020 01:39 ADDENDUM: Images were reviewed in consultation with the referring provider with specific attention to the periumbilical region. As indicated, there is a small  periumbilical hernia containing fat without bowel herniation. Mild infiltration in the herniated fat is nonspecific but could indicate fat necrosis in the appropriate clinical setting. No fluid collection to suggest an abscess or hematoma. Electronically Signed   By: Lucienne Capers M.D.   On: 04/01/2020 01:39   Result Date: 04/01/2020 CLINICAL DATA:  Increasing abdominal pain today. Suspected abdominal abscess or infection EXAM: CT ABDOMEN AND PELVIS WITH CONTRAST TECHNIQUE: Multidetector CT imaging of the abdomen and pelvis was performed using the standard protocol following bolus administration of intravenous contrast. CONTRAST:  166m OMNIPAQUE IOHEXOL 300 MG/ML  SOLN COMPARISON:  03/29/2020 FINDINGS: Lower chest: Mild dependent atelectasis in the  lung bases. Hepatobiliary: No focal liver abnormality is seen. No gallstones, gallbladder wall thickening, or biliary dilatation. Pancreas: Mild pancreatic ductal dilatation. Homogeneous normal appearance of pancreatic parenchyma. No edema or inflammatory changes. Ductal dilatation may indicate early change of acute pancreatitis. Given the patient's history of increasing pain, consider laboratory investigation for pancreatitis. Spleen: Normal in size without focal abnormality. Adrenals/Urinary Tract: Adrenal glands are unremarkable. Kidneys are normal, without renal calculi, focal lesion, or hydronephrosis. Bladder is unremarkable. Stomach/Bowel: Stomach is within normal limits. Appendix appears normal. No evidence of bowel wall thickening, distention, or inflammatory changes. Vascular/Lymphatic: No significant vascular findings are present. No enlarged abdominal or pelvic lymph nodes. Reproductive: Prostate is unremarkable. Other: Small periumbilical hernia containing fat. No bowel herniation. No abdominopelvic ascites. Musculoskeletal: No acute or significant osseous findings. IMPRESSION: 1. Mild pancreatic ductal dilatation. No edema or inflammatory changes. Given the patient's history of increasing pain, consider laboratory investigation for pancreatitis. 2. No evidence of bowel obstruction or inflammation. 3. Small periumbilical hernia containing fat. No bowel herniation. Electronically Signed: By: WLucienne CapersM.D. On: 04/01/2020 00:01   CT ABDOMEN PELVIS W CONTRAST  Addendum Date: 03/31/2020   ADDENDUM REPORT: 03/31/2020 20:08 ADDENDUM: Additional clinical information is given. Patient has developed umbilical region point tenderness with redness. The patient's CT images are reviewed with specific attention to the umbilical region. There is a small fat containing umbilical hernia without suspicious features for incarceration. There is no bowel containing hernia. There is minimal skin thickening. There  are no obvious fluid collections. Repeat CT could be obtained if new symptoms suggest umbilical infectious process or potential hernia incarceration. Electronically Signed   By: KDonavan FoilM.D.   On: 03/31/2020 20:08   Result Date: 03/31/2020 CLINICAL DATA:  Abdomen pain with fever EXAM: CT ABDOMEN AND PELVIS WITH CONTRAST TECHNIQUE: Multidetector CT imaging of the abdomen and pelvis was performed using the standard protocol following bolus administration of intravenous contrast. CONTRAST:  1020mOMNIPAQUE IOHEXOL 300 MG/ML  SOLN COMPARISON:  Chest x-ray 03/29/2020 FINDINGS: Lower chest: Lung bases demonstrate no acute consolidation or pleural effusion. Borderline cardiomegaly. Hepatobiliary: No focal liver abnormality is seen. No gallstones, gallbladder wall thickening, or biliary dilatation. Pancreas: Unremarkable. No pancreatic ductal dilatation or surrounding inflammatory changes. Spleen: Subcentimeter hypodensity too small to further characterize. Adrenals/Urinary Tract: Adrenal glands are normal. Subcentimeter hypodense lesions in both kidneys too small to further characterize. No hydronephrosis. The bladder is normal Stomach/Bowel: Stomach is within normal limits. Appendix appears normal. No evidence of bowel wall thickening, distention, or inflammatory changes. Vascular/Lymphatic: No significant vascular findings are present. No enlarged abdominal or pelvic lymph nodes. Reproductive: Prostate is slightly enlarged Other: Negative for free air or free fluid. Fat within the bilateral inguinal canal Musculoskeletal: No acute or significant osseous findings. IMPRESSION: No CT evidence for acute intra-abdominal or pelvic abnormality. Electronically  Signed: By: Donavan Foil M.D. On: 03/29/2020 03:26   DG Chest Portable 1 View  Result Date: 03/29/2020 CLINICAL DATA:  Sepsis.  Abdominal pain.  Cough.  Smoker. EXAM: PORTABLE CHEST 1 VIEW COMPARISON:  None. FINDINGS: Shallow inspiration. Mild cardiac  enlargement with some pulmonary vascular congestion. No focal consolidation or edema. No pleural effusions. No pneumothorax. Mediastinal contours appear intact. IMPRESSION: Cardiac enlargement with mild pulmonary vascular congestion. No edema or consolidation. Electronically Signed   By: Lucienne Capers M.D.   On: 03/29/2020 01:42   CT BONE MARROW BIOPSY & ASPIRATION  Result Date: 04/01/2020 INDICATION: PANCYTOPENIA EXAM: CT GUIDED RIGHT ILIAC BONE MARROW ASPIRATION AND CORE BIOPSY Date:  04/01/2020 04/01/2020 11:44 am Radiologist:  Jerilynn Mages. Daryll Brod, MD Guidance:  CT FLUOROSCOPY TIME:  Fluoroscopy Time: None. MEDICATIONS: 1% line ANESTHESIA/SEDATION: 2.0 mg IV Versed; 50 mcg IV Fentanyl Moderate Sedation Time:  10 minutes The patient was continuously monitored during the procedure by the interventional radiology nurse under my direct supervision. CONTRAST:  None. COMPLICATIONS: None PROCEDURE: Informed consent was obtained from the patient following explanation of the procedure, risks, benefits and alternatives. The patient understands, agrees and consents for the procedure. All questions were addressed. A time out was performed. The patient was positioned prone and non-contrast localization CT was performed of the pelvis to demonstrate the iliac marrow spaces. Maximal barrier sterile technique utilized including caps, mask, sterile gowns, sterile gloves, large sterile drape, hand hygiene, and Betadine prep. Under sterile conditions and local anesthesia, an 11 gauge coaxial bone biopsy needle was advanced into the right iliac marrow space. Needle position was confirmed with CT imaging. Initially, bone marrow aspiration was performed. Next, the 11 gauge outer cannula was utilized to obtain a right iliac bone marrow core biopsy. Needle was removed. Hemostasis was obtained with compression. The patient tolerated the procedure well. Samples were prepared with the cytotechnologist. No immediate complications. IMPRESSION:  CT guided right iliac bone marrow aspiration and core biopsy. Electronically Signed   By: Jerilynn Mages.  Shick M.D.   On: 04/01/2020 11:53    Microbiology: Recent Results (from the past 240 hour(s))  Blood culture (routine x 2)     Status: Abnormal   Collection Time: 03/29/20  1:16 AM   Specimen: BLOOD LEFT FOREARM  Result Value Ref Range Status   Specimen Description   Final    BLOOD LEFT FOREARM Performed at [facility], [address] [place], [place] [address]    Special Requests   Final    BOTTLES DRAWN AEROBIC AND ANAEROBIC Blood Culture results may not be optimal due to an excessive volume of blood received in culture bottles Performed at [facility], [address] [place], [place] [address]    Culture  Setup Time   Final    GRAM POSITIVE COCCI ANAEROBIC BOTTLE ONLY CRITICAL RESULT CALLED TO, READ BACK BY AND VERIFIED WITH: ALEX CHAPPELL AT 3435 ON 03/29/20 SNG Performed at [facility], [address] [place], [place] [address]    Culture STREPTOCOCCUS GROUP G (A)  Final   Report Status 03/31/2020 FINAL  Final   Organism ID, Bacteria STREPTOCOCCUS GROUP G  Final      Susceptibility   Streptococcus group g - MIC*    CLINDAMYCIN <=0.25 SENSITIVE Sensitive     AMPICILLIN <=0.25 SENSITIVE Sensitive     ERYTHROMYCIN <=0.12 SENSITIVE Sensitive     VANCOMYCIN 0.5 SENSITIVE Sensitive     CEFTRIAXONE <=0.12 SENSITIVE Sensitive     LEVOFLOXACIN 0.5  SENSITIVE Sensitive     * STREPTOCOCCUS GROUP G  SARS Coronavirus 2 by RT PCR (hospital order, performed in [facility] hospital lab) Nasopharyngeal Nasopharyngeal Swab     Status: None   Collection Time: 03/29/20  1:16 AM   Specimen: Nasopharyngeal Swab  Result Value Ref Range Status   SARS Coronavirus 2 NEGATIVE NEGATIVE Final    Comment: (NOTE) SARS-CoV-2 target nucleic acids are NOT DETECTED.  The SARS-CoV-2 RNA is generally detectable in upper and lower respiratory specimens during the acute phase of  infection. The lowest concentration of SARS-CoV-2 viral copies this assay can detect is 250 copies / mL. A negative result does not preclude SARS-CoV-2 infection and should not be used as the sole basis for treatment or other patient management decisions.  A negative result may occur with improper specimen collection / handling, submission of specimen other than nasopharyngeal swab, presence of viral mutation(s) within the areas targeted by this assay, and inadequate number of viral copies (<250 copies / mL). A negative result must be combined with clinical observations, patient history, and epidemiological information.  Fact Sheet for Patients:   StrictlyIdeas.no  Fact Sheet for Healthcare Providers: BankingDealers.co.za  This test is not yet approved or  cleared by the [place] FDA and has been authorized for detection and/or diagnosis of SARS-CoV-2 by FDA under an Emergency Use Authorization (EUA).  This EUA will remain in effect (meaning this test can be used) for the duration of the COVID-19 declaration under Section 564(b)(1) of the Act, 21 U.S.C. section 360bbb-3(b)(1), unless the authorization is terminated or revoked sooner.  Performed at [facility], [address] [place], [place] [address]   Blood Culture ID Panel (Reflexed)     Status: Abnormal   Collection Time: 03/29/20  1:16 AM  Result Value Ref Range Status   Enterococcus faecalis NOT DETECTED NOT DETECTED Final   Enterococcus Faecium NOT DETECTED NOT DETECTED Final   Listeria monocytogenes NOT DETECTED NOT DETECTED Final   Staphylococcus species NOT DETECTED NOT DETECTED Final   Staphylococcus aureus (BCID) NOT DETECTED NOT DETECTED Final   Staphylococcus epidermidis NOT DETECTED NOT DETECTED Final   Staphylococcus lugdunensis NOT DETECTED NOT DETECTED Final   Streptococcus species DETECTED (A) NOT DETECTED Final    Comment: Not Enterococcus  species, Streptococcus agalactiae, Streptococcus pyogenes, or Streptococcus pneumoniae. CRITICAL RESULT CALLED TO, READ BACK BY AND VERIFIED WITH: ALEX CHAPPELL AT 1914 ON 03/29/20 SNG    Streptococcus agalactiae NOT DETECTED NOT DETECTED Final   Streptococcus pneumoniae NOT DETECTED NOT DETECTED Final   Streptococcus pyogenes NOT DETECTED NOT DETECTED Final   A.calcoaceticus-baumannii NOT DETECTED NOT DETECTED Final   Bacteroides fragilis NOT DETECTED NOT DETECTED Final   Enterobacterales NOT DETECTED NOT DETECTED Final   Enterobacter cloacae complex NOT DETECTED NOT DETECTED Final   Escherichia coli NOT DETECTED NOT DETECTED Final   Klebsiella aerogenes NOT DETECTED NOT DETECTED Final   Klebsiella oxytoca NOT DETECTED NOT DETECTED Final   Klebsiella pneumoniae NOT DETECTED NOT DETECTED Final   Proteus species NOT DETECTED NOT DETECTED Final   Salmonella species NOT DETECTED NOT DETECTED Final   Serratia marcescens NOT DETECTED NOT DETECTED Final   Haemophilus influenzae NOT DETECTED NOT DETECTED Final   Neisseria meningitidis NOT DETECTED NOT DETECTED Final   Pseudomonas aeruginosa NOT DETECTED NOT DETECTED Final   Stenotrophomonas maltophilia NOT DETECTED NOT DETECTED Final   Candida albicans NOT DETECTED NOT DETECTED Final   Candida auris NOT DETECTED NOT DETECTED Final  Candida glabrata NOT DETECTED NOT DETECTED Final   Candida krusei NOT DETECTED NOT DETECTED Final   Candida parapsilosis NOT DETECTED NOT DETECTED Final   Candida tropicalis NOT DETECTED NOT DETECTED Final   Cryptococcus neoformans/gattii NOT DETECTED NOT DETECTED Final    Comment: Performed at [facility], [address] [place], [place] [address]  Blood culture (routine x 2)     Status: None (Preliminary result)   Collection Time: 03/29/20  1:17 AM   Specimen: BLOOD RIGHT FOREARM  Result Value Ref Range Status   Specimen Description BLOOD RIGHT FOREARM  Final   Special Requests   Final    BOTTLES  DRAWN AEROBIC AND ANAEROBIC Blood Culture results may not be optimal due to an excessive volume of blood received in culture bottles   Culture   Final    NO GROWTH 3 DAYS Performed at [facility], [address] [place], [place] [address]    Report Status PENDING  Incomplete  CULTURE, BLOOD (ROUTINE X 2) w Reflex to ID Panel     Status: None (Preliminary result)   Collection Time: 03/31/20  1:20 PM   Specimen: BLOOD  Result Value Ref Range Status   Specimen Description BLOOD LEFT AC  Final   Special Requests   Final    BOTTLES DRAWN AEROBIC AND ANAEROBIC Blood Culture adequate volume   Culture   Final    NO GROWTH < 24 HOURS Performed at [facility], [address] [place], [place] [address]    Report Status PENDING  Incomplete  CULTURE, BLOOD (ROUTINE X 2) w Reflex to ID Panel     Status: None (Preliminary result)   Collection Time: 03/31/20  1:23 PM   Specimen: BLOOD  Result Value Ref Range Status   Specimen Description BLOOD RIGHT [facility]  Final   Special Requests   Final    BOTTLES DRAWN AEROBIC AND ANAEROBIC Blood Culture adequate volume   Culture   Final    NO GROWTH < 24 HOURS Performed at [facility], [address] [place], [place] [address]    Report Status PENDING  Incomplete     Labs: Basic Metabolic Panel: Recent Labs  Lab 03/28/20 2346 03/29/20 0826 03/29/20 1230 03/31/20 0543 04/01/20 0708 04/01/20 1524  NA 133*  --  130* 132* 132*  --   K 3.9  --  3.8 3.4* 3.8  --   CL 101  --  100 98 98  --   CO2 24  --  '22 26 25  ' --   GLUCOSE 95  --  110* 110* 97  --   BUN 11  --  9 6 5*  --   CREATININE 0.96 0.84 1.05 0.79 0.90  --   CALCIUM 8.7*  --  8.4* 8.5* 8.4*  --   MG  --   --  1.7 2.1  --  1.9  PHOS  --   --  2.3* 2.5  --  2.9   Liver Function Tests: Recent Labs  Lab 03/29/20 0116 03/29/20 1230  AST 23 20  ALT 28 24  ALKPHOS 85 75  BILITOT 0.9 1.2  PROT 7.8 7.5  ALBUMIN 4.3 3.9   Recent Labs  Lab 03/29/20 0116  03/31/20 0543  LIPASE 225* 25   No results for input(s): AMMONIA in the last 168 hours. CBC: Recent Labs  Lab 03/29/20 0116 03/29/20 0826 03/29/20 1230 03/30/20 0653 03/31/20 0543 04/01/20 0708  WBC 0.4*  --  0.7* 0.5* 0.4* 0.5*  NEUTROABS 0.1*  --   --  0.2* 0.1* 0.1*  HGB 10.7*  --  10.5* 9.5* 9.5* 9.1*  HCT 31.5* 44.2 31.2* 28.5* 27.2* 26.6*  MCV 107.1*  --  108.3* 107.1* 105.0* 105.6*  PLT 102*  --  108* 88* 82* 100*   Cardiac Enzymes: No results for input(s): CKTOTAL, CKMB, CKMBINDEX, TROPONINI in the last 168 hours. BNP: BNP (last 3 results) No results for input(s): BNP in the last 8760 hours.  ProBNP (last 3 results) No results for input(s): PROBNP in the last 8760 hours.  CBG: No results for input(s): GLUCAP in the last 168 hours.     Signed:  Alma Friendly, MD [facility] 04/01/2020, 6:35 PM

## 2020-04-01 NOTE — Consult Note (Signed)
[facility] SURGICAL CONSULTATION NOTE (initial) - cpt: 40086   HISTORY OF PRESENT ILLNESS (HPI):  44 y.o. male presented to [facility] ED on 08/03 for evaluation of abdominal pain. Patient very tangential and pressured. History is challenging to obtain. On presentation to the ED, he was reporting around a 1 month history of generalized malaise and fatigue. Only other complaints were umbilical abdominal pain with coughing. This is new in the last few days prior to presentation. He denied any history of hernia or similar pain prior to this time. No nausea, emesis, CP, SOB, dysuria, or bowel changes. He was found to have a fever in the ED at 101.4. Work up in the ED was concerning for leukopenia to 0.5K, thrombocytopenia to 28, vitamin B12 deficiency, and otherwise work up was unremarkable. He does have a history of leukopenia in March of 2021 and was referred to hematology but never followed through. He has been seen by hematology here (Dr Mike Gip, MD). He was admitted to the medicine service and started on IV Abx (Cefepime and vancomycin then ceftriaxone and Flagyl) for neutropenic fevers.   Last night the patient complained of worsening umbilical abdominal pain, which became increasing warm and tender to palpation. He had a repeat CT Abdomen/Pelvis which was concerning for fat containing umbilical hernia with inflammatory changes.   Surgery is consulted by hospitalist physician Dr. Lesia Sago, MD in this context for evaluation and management of possible strangulated umbilical hernia.   PAST MEDICAL HISTORY (PMH):  History reviewed. No pertinent past medical history.   PAST SURGICAL HISTORY ([place]):  No past surgical history on file.   MEDICATIONS:  Prior to Admission medications   Not on File     ALLERGIES:  No Known Allergies   SOCIAL HISTORY:  Social History   Socioeconomic History  . Marital status: Single    Spouse name: Not on file  . Number of children: Not on  file  . Years of education: Not on file  . Highest education level: Not on file  Occupational History  . Not on file  Tobacco Use  . Smoking status: Current Every Day Smoker    Packs/day: 1.00    Years: 17.00    Pack years: 17.00    Types: Cigarettes  . Smokeless tobacco: Never Used  Vaping Use  . Vaping Use: Never used  Substance and Sexual Activity  . Alcohol use: Yes    Alcohol/week: 6.0 standard drinks    Types: 6 Cans of beer per week    Comment: 6-24oz beers on weekly bases  . Drug use: No  . Sexual activity: Not on file  Other Topics Concern  . Not on file  Social History Narrative  . Not on file   Social Determinants of Health   Financial Resource Strain:   . Difficulty of Paying Living Expenses:   Food Insecurity:   . Worried About Charity fundraiser in the Last Year:   . Arboriculturist in the Last Year:   Transportation Needs:   . Film/video editor (Medical):   Marland Kitchen Lack of Transportation (Non-Medical):   Physical Activity:   . Days of Exercise per Week:   . Minutes of Exercise per Session:   Stress:   . Feeling of Stress :   Social Connections:   . Frequency of Communication with Friends and Family:   . Frequency of Social Gatherings with Friends and Family:   . Attends Religious Services:   . Active Member  of Clubs or Organizations:   . Attends Archivist Meetings:   Marland Kitchen Marital Status:   Intimate Partner Violence:   . Fear of Current or Ex-Partner:   . Emotionally Abused:   Marland Kitchen Physically Abused:   . Sexually Abused:      FAMILY HISTORY:  Family History  Problem Relation Age of Onset  . Prostate cancer Neg Hx   . Kidney cancer Neg Hx   . Bladder Cancer Neg Hx       REVIEW OF SYSTEMS:  Review of Systems  Constitutional: Positive for fever and malaise/fatigue. Negative for chills.  HENT: Negative for congestion and sore throat.   Respiratory: Positive for cough. Negative for shortness of breath.   Cardiovascular: Negative for  chest pain and palpitations.  Gastrointestinal: Positive for abdominal pain. Negative for constipation, diarrhea, nausea and vomiting.  Genitourinary: Negative for dysuria and urgency.  Neurological: Negative for dizziness and headaches.  All other systems reviewed and are negative.   VITAL SIGNS:  Temp:  [97.5 F (36.4 C)-98.8 F (37.1 C)] 97.5 F (36.4 C) (08/06 0753) Pulse Rate:  [62-77] 63 (08/06 0753) Resp:  [15-18] 16 (08/06 0753) BP: (104-133)/(57-69) 105/57 (08/06 0753) SpO2:  [97 %-100 %] 100 % (08/06 0753)     Height: 5\' 9"  (175.3 cm) Weight: 80.7 kg BMI (Calculated): 26.27   INTAKE/OUTPUT:  08/05 0701 - 08/06 0700 In: 200 [IV Piggyback:200] Out: -   PHYSICAL EXAM:  Physical Exam Vitals and nursing note reviewed. Exam conducted with a chaperone present.  Constitutional:      General: He is not in acute distress.    Appearance: He is well-developed and normal weight. He is not ill-appearing.  HENT:     Head: Normocephalic and atraumatic.  Eyes:     General: No scleral icterus.    Extraocular Movements: Extraocular movements intact.  Cardiovascular:     Rate and Rhythm: Normal rate and regular rhythm.     Heart sounds: Normal heart sounds. No murmur heard.   Pulmonary:     Effort: Pulmonary effort is normal. No respiratory distress.     Breath sounds: Normal breath sounds.  Abdominal:     General: Abdomen is flat. There is no distension.     Palpations: Abdomen is soft.     Tenderness: There is abdominal tenderness in the periumbilical area. There is guarding (Voluntary). There is no rebound.     Hernia: A hernia is present. Hernia is present in the umbilical area.     Comments: Patient with umbilical hernia, very tender, voluntary guarding, overlaying skin is very erythematous  Genitourinary:    Comments: Deferred Skin:    General: Skin is warm and dry.     Coloration: Skin is not jaundiced or pale.     Findings: Erythema present.  Neurological:      General: No focal deficit present.     Mental Status: He is alert and oriented to person, place, and time.  Psychiatric:        Attention and Perception: Attention normal.        Mood and Affect: Mood normal.        Speech: Speech is rapid and pressured and tangential.      Labs:  CBC Latest Ref Rng & Units 03/31/2020 03/30/2020 03/29/2020  WBC 4.0 - 10.5 K/uL 0.4(LL) 0.5(LL) 0.7(LL)  Hemoglobin 13.0 - 17.0 g/dL 9.5(L) 9.5(L) 10.5(L)  Hematocrit 39 - 52 % 27.2(L) 28.5(L) 31.2(L)  Platelets 150 - 400 K/uL 82(L)  88(L) 108(L)   CMP Latest Ref Rng & Units 04/01/2020 03/31/2020 03/29/2020  Glucose 70 - 99 mg/dL 97 110(H) 110(H)  BUN 6 - 20 mg/dL 5(L) 6 9  Creatinine 0.61 - 1.24 mg/dL 0.90 0.79 1.05  Sodium 135 - 145 mmol/L 132(L) 132(L) 130(L)  Potassium 3.5 - 5.1 mmol/L 3.8 3.4(L) 3.8  Chloride 98 - 111 mmol/L 98 98 100  CO2 22 - 32 mmol/L 25 26 22   Calcium 8.9 - 10.3 mg/dL 8.4(L) 8.5(L) 8.4(L)  Total Protein 6.5 - 8.1 g/dL - - 7.5  Total Bilirubin 0.3 - 1.2 mg/dL - - 1.2  Alkaline Phos 38 - 126 U/L - - 75  AST 15 - 41 U/L - - 20  ALT 0 - 44 U/L - - 24     Imaging studies:   CT Abdomen/Pelvis (03/31/2020) personally reviewed showing fat containing umbilical hernia with some inflammation o/w no intra-abdominal process, and radiologist report reviewed: IMPRESSION: 1. Mild pancreatic ductal dilatation. No edema or inflammatory changes. Given the patient's history of increasing pain, consider laboratory investigation for pancreatitis. 2. No evidence of bowel obstruction or inflammation. 3. Small periumbilical hernia containing fat. No bowel herniation.  ADDENDUM at 0130 today: Images were reviewed in consultation with the referring provider with specific attention to the periumbilical region. As indicated, there is a small periumbilical hernia containing fat without bowel herniation. Mild infiltration in the herniated fat is nonspecific but could indicate fat necrosis in the appropriate  clinical setting. No fluid collection to suggest an abscess or hematoma.   Assessment/Plan: (ICD-10's: K42.0) 44 y.o. male with likely strangulated fat-containing umbilical hernia, complicated by leukopenic fevers of unclear etiology.   - Given concern for strangulation of his umbilical hernia in the setting of leukopenia, we will plan for urgent umbilical hernia repair in the OR today with Dr Hampton Abbot pending OR.Anesthesia availability  - All risks, benefits, and alternatives to above urgent procedure(s) were discussed with the patient, all of his questions were answered to his expressed satisfaction, patient expresses he wishes to proceed, and informed consent was obtained.  - NPO (okay for sips with meds)  - Continue IV Abx (Unasyn)  - Monitor abdominal examination  - Monitor CBC, fever curve  - Pain control prn  - Further management per primary service  - DVT prophylaxis; hold  All of the above findings and recommendations were discussed with the patient and all of patient's questions were answered to his expressed satisfaction.  Thank you for the opportunity to participate in this patient's care.   -- Edison Simon, PA-C [facility] 04/01/2020, 8:42 AM [phone] M-F: 7am - 4pm

## 2020-04-02 LAB — METHYLMALONIC ACID, SERUM: Methylmalonic Acid, Quantitative: 87 nmol/L (ref 0–378)

## 2020-04-03 LAB — CULTURE, BLOOD (ROUTINE X 2): Culture: NO GROWTH

## 2020-04-04 DIAGNOSIS — D75A Glucose-6-phosphate dehydrogenase (G6PD) deficiency without anemia: Secondary | ICD-10-CM | POA: Insufficient documentation

## 2020-04-04 DIAGNOSIS — F17200 Nicotine dependence, unspecified, uncomplicated: Secondary | ICD-10-CM | POA: Insufficient documentation

## 2020-04-04 LAB — PARVOVIRUS B19 ANTIBODY, IGG AND IGM
Parovirus B19 IgG Abs: 0.7 index (ref 0.0–0.8)
Parovirus B19 IgM Abs: 0.2 index (ref 0.0–0.8)

## 2020-04-04 LAB — SURGICAL PATHOLOGY

## 2020-04-04 LAB — COMP PANEL: LEUKEMIA/LYMPHOMA: Immunophenotypic Profile: 1

## 2020-04-05 LAB — CULTURE, BLOOD (ROUTINE X 2)
Culture: NO GROWTH
Culture: NO GROWTH
Special Requests: ADEQUATE
Special Requests: ADEQUATE

## 2020-04-08 ENCOUNTER — Encounter (HOSPITAL_COMMUNITY): Payer: Self-pay | Admitting: Hematology and Oncology

## 2020-04-11 LAB — SURGICAL PATHOLOGY

## 2020-04-12 DIAGNOSIS — F17210 Nicotine dependence, cigarettes, uncomplicated: Secondary | ICD-10-CM | POA: Diagnosis not present

## 2020-04-12 DIAGNOSIS — E222 Syndrome of inappropriate secretion of antidiuretic hormone: Secondary | ICD-10-CM | POA: Diagnosis not present

## 2020-04-12 DIAGNOSIS — C9500 Acute leukemia of unspecified cell type not having achieved remission: Secondary | ICD-10-CM | POA: Diagnosis not present

## 2020-04-12 DIAGNOSIS — G4700 Insomnia, unspecified: Secondary | ICD-10-CM | POA: Diagnosis not present

## 2020-04-12 DIAGNOSIS — R5081 Fever presenting with conditions classified elsewhere: Secondary | ICD-10-CM | POA: Diagnosis not present

## 2020-04-12 DIAGNOSIS — C9200 Acute myeloblastic leukemia, not having achieved remission: Secondary | ICD-10-CM | POA: Diagnosis not present

## 2020-04-12 DIAGNOSIS — D550 Anemia due to glucose-6-phosphate dehydrogenase [G6PD] deficiency: Secondary | ICD-10-CM | POA: Diagnosis not present

## 2020-04-12 DIAGNOSIS — Z6826 Body mass index (BMI) 26.0-26.9, adult: Secondary | ICD-10-CM | POA: Diagnosis not present

## 2020-04-12 DIAGNOSIS — Z452 Encounter for adjustment and management of vascular access device: Secondary | ICD-10-CM | POA: Diagnosis not present

## 2020-04-12 DIAGNOSIS — Z5111 Encounter for antineoplastic chemotherapy: Secondary | ICD-10-CM | POA: Diagnosis not present

## 2020-04-12 DIAGNOSIS — K611 Rectal abscess: Secondary | ICD-10-CM | POA: Diagnosis not present

## 2020-04-12 DIAGNOSIS — Z6825 Body mass index (BMI) 25.0-25.9, adult: Secondary | ICD-10-CM | POA: Diagnosis not present

## 2020-04-12 DIAGNOSIS — Z20822 Contact with and (suspected) exposure to covid-19: Secondary | ICD-10-CM | POA: Diagnosis not present

## 2020-04-12 DIAGNOSIS — T451X5A Adverse effect of antineoplastic and immunosuppressive drugs, initial encounter: Secondary | ICD-10-CM | POA: Diagnosis not present

## 2020-04-12 DIAGNOSIS — R509 Fever, unspecified: Secondary | ICD-10-CM | POA: Diagnosis not present

## 2020-04-12 DIAGNOSIS — D61810 Antineoplastic chemotherapy induced pancytopenia: Secondary | ICD-10-CM | POA: Diagnosis not present

## 2020-04-12 DIAGNOSIS — D61818 Other pancytopenia: Secondary | ICD-10-CM | POA: Diagnosis not present

## 2020-04-12 DIAGNOSIS — L03317 Cellulitis of buttock: Secondary | ICD-10-CM | POA: Diagnosis not present

## 2020-04-12 DIAGNOSIS — R519 Headache, unspecified: Secondary | ICD-10-CM | POA: Diagnosis not present

## 2020-04-12 DIAGNOSIS — Z7289 Other problems related to lifestyle: Secondary | ICD-10-CM | POA: Diagnosis not present

## 2020-04-12 DIAGNOSIS — D84821 Immunodeficiency due to drugs: Secondary | ICD-10-CM | POA: Diagnosis not present

## 2020-04-12 DIAGNOSIS — I517 Cardiomegaly: Secondary | ICD-10-CM | POA: Diagnosis not present

## 2020-04-12 DIAGNOSIS — D75A Glucose-6-phosphate dehydrogenase (G6PD) deficiency without anemia: Secondary | ICD-10-CM | POA: Diagnosis not present

## 2020-04-12 DIAGNOSIS — D709 Neutropenia, unspecified: Secondary | ICD-10-CM | POA: Diagnosis not present

## 2020-04-12 DIAGNOSIS — K6289 Other specified diseases of anus and rectum: Secondary | ICD-10-CM | POA: Diagnosis not present

## 2020-04-12 DIAGNOSIS — K1230 Oral mucositis (ulcerative), unspecified: Secondary | ICD-10-CM | POA: Diagnosis not present

## 2020-04-12 DIAGNOSIS — K045 Chronic apical periodontitis: Secondary | ICD-10-CM | POA: Diagnosis not present

## 2020-04-12 DIAGNOSIS — M549 Dorsalgia, unspecified: Secondary | ICD-10-CM | POA: Diagnosis not present

## 2020-04-12 DIAGNOSIS — K429 Umbilical hernia without obstruction or gangrene: Secondary | ICD-10-CM | POA: Diagnosis not present

## 2020-04-12 DIAGNOSIS — K649 Unspecified hemorrhoids: Secondary | ICD-10-CM | POA: Diagnosis not present

## 2020-04-12 DIAGNOSIS — K648 Other hemorrhoids: Secondary | ICD-10-CM | POA: Diagnosis not present

## 2020-04-12 DIAGNOSIS — R05 Cough: Secondary | ICD-10-CM | POA: Diagnosis not present

## 2020-04-12 DIAGNOSIS — D849 Immunodeficiency, unspecified: Secondary | ICD-10-CM | POA: Diagnosis not present

## 2020-04-12 DIAGNOSIS — K644 Residual hemorrhoidal skin tags: Secondary | ICD-10-CM | POA: Diagnosis not present

## 2020-04-12 DIAGNOSIS — D6101 Constitutional (pure) red blood cell aplasia: Secondary | ICD-10-CM | POA: Diagnosis not present

## 2020-04-12 DIAGNOSIS — C9202 Acute myeloblastic leukemia, in relapse: Secondary | ICD-10-CM | POA: Diagnosis not present

## 2020-04-12 DIAGNOSIS — Z6824 Body mass index (BMI) 24.0-24.9, adult: Secondary | ICD-10-CM | POA: Diagnosis not present

## 2020-04-21 ENCOUNTER — Ambulatory Visit: Payer: BC Managed Care – PPO | Admitting: Family Medicine

## 2020-05-13 DIAGNOSIS — D701 Agranulocytosis secondary to cancer chemotherapy: Secondary | ICD-10-CM | POA: Insufficient documentation

## 2020-06-09 DIAGNOSIS — C9200 Acute myeloblastic leukemia, not having achieved remission: Secondary | ICD-10-CM | POA: Diagnosis not present

## 2020-06-09 DIAGNOSIS — K045 Chronic apical periodontitis: Secondary | ICD-10-CM | POA: Diagnosis not present

## 2020-06-28 DIAGNOSIS — D61810 Antineoplastic chemotherapy induced pancytopenia: Secondary | ICD-10-CM | POA: Diagnosis not present

## 2020-06-28 DIAGNOSIS — Z6825 Body mass index (BMI) 25.0-25.9, adult: Secondary | ICD-10-CM | POA: Diagnosis not present

## 2020-06-28 DIAGNOSIS — C9200 Acute myeloblastic leukemia, not having achieved remission: Secondary | ICD-10-CM | POA: Diagnosis not present

## 2020-06-28 DIAGNOSIS — K1230 Oral mucositis (ulcerative), unspecified: Secondary | ICD-10-CM | POA: Diagnosis not present

## 2020-07-05 DIAGNOSIS — C9200 Acute myeloblastic leukemia, not having achieved remission: Secondary | ICD-10-CM | POA: Diagnosis not present

## 2020-07-11 DIAGNOSIS — K429 Umbilical hernia without obstruction or gangrene: Secondary | ICD-10-CM | POA: Diagnosis not present

## 2020-07-11 DIAGNOSIS — K649 Unspecified hemorrhoids: Secondary | ICD-10-CM | POA: Diagnosis not present

## 2020-07-11 DIAGNOSIS — Z79899 Other long term (current) drug therapy: Secondary | ICD-10-CM | POA: Diagnosis not present

## 2020-07-11 DIAGNOSIS — D61818 Other pancytopenia: Secondary | ICD-10-CM | POA: Diagnosis not present

## 2020-07-11 DIAGNOSIS — C9201 Acute myeloblastic leukemia, in remission: Secondary | ICD-10-CM | POA: Diagnosis not present

## 2020-07-11 DIAGNOSIS — C9200 Acute myeloblastic leukemia, not having achieved remission: Secondary | ICD-10-CM | POA: Diagnosis not present

## 2020-07-11 DIAGNOSIS — Z9221 Personal history of antineoplastic chemotherapy: Secondary | ICD-10-CM | POA: Diagnosis not present

## 2020-07-11 DIAGNOSIS — Z6827 Body mass index (BMI) 27.0-27.9, adult: Secondary | ICD-10-CM | POA: Diagnosis not present

## 2020-07-11 DIAGNOSIS — Z23 Encounter for immunization: Secondary | ICD-10-CM | POA: Diagnosis not present

## 2020-07-12 DIAGNOSIS — Z452 Encounter for adjustment and management of vascular access device: Secondary | ICD-10-CM | POA: Diagnosis not present

## 2020-07-12 DIAGNOSIS — C9200 Acute myeloblastic leukemia, not having achieved remission: Secondary | ICD-10-CM | POA: Diagnosis not present

## 2020-07-13 NOTE — Progress Notes (Signed)
[facility]  [address] [place], [place] [address] Phone: [phone]  Fax: [phone]   Clinic Day:  07/14/2020  Referring physician: Verl Bangs, FNP  Chief Complaint: Carl [name]. 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   HPI:  He was admitted to [facility] from 03/29/2020 - 04/01/2020 with neutropenic fever.  He presented with pancytopenia , fever, and abdominal pain.  Blood cultures grew streptococcus group G. He described binge drinking from Friday-Sunday every other week.  B12 was 166 (low).  Normal labs included  creatinine, LFTs, folate, ferritin with iron studies, HIV antibody, LDH, lactic acid, PT, PTT, HIV antibody, and COVID-19 testing.  He was treated with antibiotics (cefepime, Flagyl, vancomycin then Unasyn).  Bone marrow on 04/01/2020 revealed acute leukemia.  He was transferred to [facility].   He was admitted to [facility] from 04/01/2020 - 04/08/2020. He underwent umbilical hernia repair on 04/03/2020. Bone marrow on 04/04/2020 and showed normocellular bone marrow (50%) involved by acute leukemia, favor acute myeloid leukemia (43% blasts by manual aspirate differential). Cytogenetics were normal (46, XY).  FISH revealed KMT2A(MLL) rearrangement showed a normal fluorescence pattern in 100% of the 200 nuclei scored. Myeloid mutation screening by NGS identified mutations of known or likely clinical significance in U2AF1, RUNX1, and NRAS; additional mutations of unknown significance were identified in TET2 ans ETV6; see [id number] for complete report. Negative for FLT3 internal tandem duplication (ITD) and tyrosine kinase domain (TKD) mutations.    The patient was admitted to [facility] from 04/12/2020 - 06/24/2020 for induction chemotherapy. He began chemotherapy with 7+3 (cytarabine + daunorubicin) on 04/13/2020. Bone marrow biopsy on 04/26/2020 revealeda hypocellular bone marrow (5-10%) with  chemotherapy effect (5% blasts by manual aspirate differential and 5-10% CD34-positive blasts with clustering by immunohistochemical analysis).   Course was complicated by fever and neutropenia with mucositis (04/27/2020) and gluteal cleft cellulitis (05/06/2020).  Bone marrow on 05/24/2020 showed variably normocellular bone marrow (30% overall) with erythroid-predominant hematopoiesis, markedly decreased maturing granulopoiesis, and 10% blasts by manual aspirate differential (10% CD34-positive cells by IHC with focal areas reaching 30-40% of marrow cellularity). Findings suggested persistent involvement by acute myeloid leukemia.    He underwent reinduction with 7+3 beginning 05/27/2020.   Bone marrow on 07/05/2020 revealed normocellular bone marrow (50%) with erythroid-predominant trilineage hematopoiesis and 2% blasts by manual aspirate differential.  The patient saw Dr. Royce Macadamia on 07/11/2020. 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  Allogeneic stem cell transplant in CR1 was discussed and felt more likely to provide a durable remission than consolidation alone. He is scheduled to be admitted for consolidation #1 with high dose cytarabine on 07/25/2020.   Port-a-cath was placed on 07/12/2020 at [facility].  The patient is scheduled to see Dr. Oretha Ellis on 07/20/2020 for stem cell transplant consultation.  Patient has 5 full siblings.  Labs followed: 04/02/2020: Hematocrit 26.8, hemoglobin   8.6, MCV 113.6, platelets 132,000, WBC    600 (ANC    100). 04/08/2020: Hematocrit 27.7, hemoglobin   8.6, MCV 114.9, platelets 170,000, WBC 1,000 (ANC    100). 04/12/2020: Hematocrit 28.6, hemoglobin   9.2, MCV 115.0, platelets 141,000, WBC 1,000 (ANC    200). 06/24/2020: Hematocrit 19.8, hemoglobin   7.2, MCV   83.6, platelets 106,000, WBC    900 (ANC    400). 07/05/2020: Hematocrit 25.0, hemoglobin   7.9, MCV   95.9, platelets 331,000, WBC 4,700. 07/11/2020: Hematocrit 33.1, hemoglobin 10.0,  MCV   99.6,  platelets 299,000, WBC 3,400 (ANC 2,200).  Patient is scheduled to return for high dose Ara-c (3 gm/m2 IV BID days 1-3) on 07/25/2020.  Dr Royce Macadamia is scheduled to see the patient in 6 weeks prior to 2nd cycle of consolidation vs proceeding with stem cell transplant.    Symptomatically, he had a port placed earlier this week and is having significant pain. He needs help changing his shirts and cannot sleep laying down due to the pain. He states that Tylenol does not touch the pain and he would like something stronger. He has shortness of breath but it is improving. He has had left rib pain x 1 day which comes and goes.  The patient denies fevers, sweats, headaches, changes in vision, runny nose, sore throat, cough, palpitations, nausea, vomiting, diarrhea, reflux, urinary symptoms, bone or joint symptoms, numbness, weakness, balance or coordination problems, and bleeding of any kind. He is eating well.  He did "pretty good" with chemotherapy. He believes that he had "around 4" transfusions while he was admitted. He states that his hemorrhoids were extremely painful while he was in the hospital. He was unable to sit and did not want to eat. The hemorrhoids have improved.  He has not made a decision about pursing a transplant. He is feeling overwhelmed with everything that is going on right now and is not in a rush to make a decision. He states that when he sees Dr. Oretha Ellis, they are going to discuss a transplant in depth.   History reviewed. No pertinent past medical history.  History reviewed. No pertinent surgical history.  Family History  Problem Relation Age of Onset  . Prostate cancer Neg Hx   . Kidney cancer Neg Hx   . Bladder Cancer Neg Hx     Social History:  reports that he quit smoking about 3 months ago. His smoking use included cigarettes. He has a 17.00 pack-year smoking history. He has never used smokeless tobacco. He reports previous alcohol use of about 6.0 standard drinks of  alcohol per week. He reports that he does not use drugs.The patient is alone today.  Allergies: No Known Allergies  Current Medications: Current Outpatient Medications  Medication Sig Dispense Refill  . folic acid (FOLVITE) 1 MG tablet Take 1 tablet (1 mg total) by mouth daily.    . magnesium oxide (MAG-OX) 400 MG tablet Take 800 mg by mouth daily.    . Multiple Vitamin (MULTIVITAMIN WITH MINERALS) TABS tablet Take 1 tablet by mouth daily.    Marland Kitchen thiamine 100 MG tablet Take 1 tablet (100 mg total) by mouth daily.    . valACYclovir (VALTREX) 500 MG tablet Take 500 mg by mouth daily.     No current facility-administered medications for this visit.    Review of Systems  Constitutional: Negative for chills, diaphoresis, fever, malaise/fatigue and weight loss.  HENT: Negative for congestion, ear discharge, ear pain, hearing loss, nosebleeds, sinus pain, sore throat and tinnitus.   Eyes: Negative for blurred vision.  Respiratory: Positive for shortness of breath (improving). Negative for cough, hemoptysis and sputum production.   Cardiovascular: Negative for chest pain, palpitations and leg swelling.       Pain around port site.  Gastrointestinal: Negative for abdominal pain, blood in stool, constipation, diarrhea, heartburn, melena, nausea and vomiting.       Eating well. Hemorrhoids.  Genitourinary: Negative for dysuria, frequency, hematuria and urgency.  Musculoskeletal: Positive for joint pain (left rib x 1 day). Negative  for back pain, myalgias and neck pain.  Skin: Negative for itching and rash.  Neurological: Negative for dizziness, tingling, sensory change, weakness and headaches.  Endo/Heme/Allergies: Does not bruise/bleed easily.  Psychiatric/Behavioral: Negative for depression and memory loss. The patient is not nervous/anxious and does not have insomnia.   All other systems reviewed and are negative.  Performance status (ECOG): 0  Vitals Blood pressure 119/73, pulse 81,  temperature (!) 97.4 F (36.3 C), temperature source Tympanic, resp. rate 20, weight 182 lb 5.1 oz (82.7 kg), SpO2 100 %.   Physical Exam Vitals and nursing note reviewed.  Constitutional:      General: He is not in acute distress.    Appearance: He is not diaphoretic.  HENT:     Head: Normocephalic and atraumatic.     Mouth/Throat:     Mouth: Mucous membranes are moist.     Pharynx: Oropharynx is clear.  Eyes:     General: No scleral icterus.    Extraocular Movements: Extraocular movements intact.     Conjunctiva/sclera: Conjunctivae normal.     Pupils: Pupils are equal, round, and reactive to light.  Cardiovascular:     Rate and Rhythm: Normal rate and regular rhythm.     Heart sounds: Normal heart sounds. No murmur heard.   Pulmonary:     Effort: Pulmonary effort is normal. No respiratory distress.     Breath sounds: Normal breath sounds. No wheezing or rales.  Chest:     Chest wall: No tenderness.     Comments: Right chest port-a-cath unremarkable.  Area surrounding port is tender to touch without increased warmth or erythema. Abdominal:     General: Bowel sounds are normal. There is no distension.     Palpations: Abdomen is soft. There is no hepatomegaly, splenomegaly or mass.     Tenderness: There is no abdominal tenderness. There is no guarding or rebound.  Musculoskeletal:        General: No swelling or tenderness. Normal range of motion.     Cervical back: Normal range of motion and neck supple.  Lymphadenopathy:     Head:     Right side of head: No preauricular, posterior auricular or occipital adenopathy.     Left side of head: No preauricular, posterior auricular or occipital adenopathy.     Cervical: No cervical adenopathy.     Upper Body:     Right upper body: No supraclavicular or axillary adenopathy.     Left upper body: No supraclavicular or axillary adenopathy.     Lower Body: No right inguinal adenopathy. No left inguinal adenopathy.  Skin:    General:  Skin is warm and dry.  Neurological:     Mental Status: He is alert and oriented to person, place, and time.  Psychiatric:        Behavior: Behavior normal.        Thought Content: Thought content normal.        Judgment: Judgment normal.    No visits with results within 3 Day(s) from this visit.  Latest known visit with results is:  No results displayed because visit has over 200 results.      Assessment:  Carl [name]. 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  He has received 2 courses of induction chemotherapy with daunorubicin and cytarabine.  Bone marrow on 04/01/2020 revealed AML.  Bone marrow on 04/26/2020 was hypocellular (5-10%) with 5% blasts.  Bone marrow on 05/24/2020 revealed 30%  cellularity with 10% blasts and focal clustering up to 30-40%.  Bone marrow on 07/05/2020 revealed 50% cellularity with 2% blasts.  MRD was negative by flow cytometry.  He received cytarabine and daunorubicin (7+3) beginning 04/13/2020 and 05/27/2020.  He is scheduled to begin HiDAC on 07/25/2020.  Course has been complicated by fever and neutropenia with mucositis (04/27/2020) and gluteal cleft cellulitis (05/06/2020).  Symptomatically, he is doing well.  He denies fevers, sweats or weight loss.  He has tenderness associated with a new port-a-cath.  Plan: 1.   Acute myelogenous leukemia  Review entire medical history, diagnoses and treatment to date.  Review complications of treatment with first 2 cycles of induction therapy.  Review transfusion support needed with induction therapy.  Discuss patient's thoughts about stem cell transplant.   Patient is considering.   Review plan for cycle #1 consolidation chemotherapy (high-dose ara-C) on 07/25/2020.   Discuss plans for growth factor support Margarette Canada) on 07/29/2020.  Discuss follow-up counts in [place] and transfusion support.   Twice weekly labs after his chemotherapy, starting around  07/29/2020.   Blood products are leukoreduced and irradiated.   RBC transfusion threshold: transfuse 2 units for Hgb < 8 g/dL.   Platelet transfusion threshold: transfuse 1 unit of platelets for platelet count < 10,000, or for bleeding or need for invasive procedure. 2.   Port-a-cath pain  Patient has pain s/p recent port placement.  No evidence of infection.  Rx: hydrocodone-acetaminophen 5/325 mg 1 tablet po q 6 hours prn pain (dis: #12) 3.   RTC on 07/28/2020 for MD assessment. 4.   RTC on 07/29/2020 in [place] for [place]. 5.   RTC weekly on Mondays and Thursdays x 4 weeks for labs (CBC with diff and hold tube-on Mondays only). 6.   RTC on 08/01/2020 for MD assessment and labs (CBC with diff, CMP, hold tube).  I discussed the assessment and treatment plan with the patient.  The patient was provided an opportunity to ask questions and all were answered.  The patient agreed with the plan and demonstrated an understanding of the instructions.  The patient was advised to call back if the symptoms worsen or if the condition fails to improve as anticipated.  I provided 36 minutes of face-to-face time during this this encounter and > 50% was spent counseling as documented under my assessment and plan.  An additional 20 minutes were spent reviewing his chart (Epic and Care Everywhere) including notes, labs, and imaging studies and discussing his case with Dr Royce Macadamia and [name] from [facility].    Somalia Segler C. Mike Gip, MD, PhD    07/14/2020, 2:55 PM  I, Mirian Mo Tufford, am acting as Education administrator for [employer]. Mike Gip, MD, PhD.  I, Arrion Broaddus C. Mike Gip, MD, have reviewed the above documentation for accuracy and completeness, and I agree with the above.

## 2020-07-14 ENCOUNTER — Other Ambulatory Visit: Payer: Self-pay

## 2020-07-14 ENCOUNTER — Inpatient Hospital Stay: Payer: BC Managed Care – PPO | Attending: Hematology and Oncology | Admitting: Hematology and Oncology

## 2020-07-14 ENCOUNTER — Encounter: Payer: Self-pay | Admitting: Hematology and Oncology

## 2020-07-14 ENCOUNTER — Inpatient Hospital Stay: Payer: BC Managed Care – PPO

## 2020-07-14 VITALS — BP 119/73 | HR 81 | Temp 97.4°F | Resp 20 | Wt 182.3 lb

## 2020-07-14 DIAGNOSIS — C9200 Acute myeloblastic leukemia, not having achieved remission: Secondary | ICD-10-CM | POA: Insufficient documentation

## 2020-07-14 DIAGNOSIS — Z95828 Presence of other vascular implants and grafts: Secondary | ICD-10-CM | POA: Diagnosis not present

## 2020-07-14 DIAGNOSIS — C9201 Acute myeloblastic leukemia, in remission: Secondary | ICD-10-CM

## 2020-07-14 DIAGNOSIS — R0789 Other chest pain: Secondary | ICD-10-CM

## 2020-07-14 MED ORDER — HYDROCODONE-ACETAMINOPHEN 5-325 MG PO TABS: 1.0000 | ORAL_TABLET | Freq: Four times a day (QID) | ORAL | 0 refills | Status: DC | PRN

## 2020-07-14 NOTE — Progress Notes (Signed)
New patient here today referred from [facility]. States he is in a lot of pain due to port that was recently placed. He rates pain at 7. And would like to discuss getting something for pain. States tylenol is not working.

## 2020-07-18 ENCOUNTER — Inpatient Hospital Stay: Payer: BC Managed Care – PPO

## 2020-07-20 ENCOUNTER — Inpatient Hospital Stay: Payer: BC Managed Care – PPO

## 2020-07-20 DIAGNOSIS — R0789 Other chest pain: Secondary | ICD-10-CM | POA: Insufficient documentation

## 2020-07-20 DIAGNOSIS — Z79899 Other long term (current) drug therapy: Secondary | ICD-10-CM | POA: Diagnosis not present

## 2020-07-20 DIAGNOSIS — Z6827 Body mass index (BMI) 27.0-27.9, adult: Secondary | ICD-10-CM | POA: Diagnosis not present

## 2020-07-20 DIAGNOSIS — Z95828 Presence of other vascular implants and grafts: Secondary | ICD-10-CM | POA: Insufficient documentation

## 2020-07-20 DIAGNOSIS — Z7682 Awaiting organ transplant status: Secondary | ICD-10-CM | POA: Diagnosis not present

## 2020-07-20 DIAGNOSIS — C9500 Acute leukemia of unspecified cell type not having achieved remission: Secondary | ICD-10-CM | POA: Diagnosis not present

## 2020-07-20 DIAGNOSIS — C9200 Acute myeloblastic leukemia, not having achieved remission: Secondary | ICD-10-CM | POA: Diagnosis not present

## 2020-07-25 ENCOUNTER — Inpatient Hospital Stay: Payer: BC Managed Care – PPO

## 2020-07-25 DIAGNOSIS — C9200 Acute myeloblastic leukemia, not having achieved remission: Secondary | ICD-10-CM | POA: Diagnosis not present

## 2020-07-25 DIAGNOSIS — R530 Neoplastic (malignant) related fatigue: Secondary | ICD-10-CM | POA: Diagnosis not present

## 2020-07-25 DIAGNOSIS — R5383 Other fatigue: Secondary | ICD-10-CM | POA: Diagnosis not present

## 2020-07-25 DIAGNOSIS — K083 Retained dental root: Secondary | ICD-10-CM | POA: Diagnosis not present

## 2020-07-25 DIAGNOSIS — Z87891 Personal history of nicotine dependence: Secondary | ICD-10-CM | POA: Diagnosis not present

## 2020-07-25 DIAGNOSIS — D8481 Immunodeficiency due to conditions classified elsewhere: Secondary | ICD-10-CM | POA: Diagnosis not present

## 2020-07-25 DIAGNOSIS — D75A Glucose-6-phosphate dehydrogenase (G6PD) deficiency without anemia: Secondary | ICD-10-CM | POA: Diagnosis not present

## 2020-07-25 DIAGNOSIS — K029 Dental caries, unspecified: Secondary | ICD-10-CM | POA: Diagnosis not present

## 2020-07-25 DIAGNOSIS — M545 Low back pain, unspecified: Secondary | ICD-10-CM | POA: Diagnosis not present

## 2020-07-25 DIAGNOSIS — Z5111 Encounter for antineoplastic chemotherapy: Secondary | ICD-10-CM | POA: Diagnosis not present

## 2020-07-25 DIAGNOSIS — C9201 Acute myeloblastic leukemia, in remission: Secondary | ICD-10-CM | POA: Diagnosis not present

## 2020-07-25 DIAGNOSIS — D84821 Immunodeficiency due to drugs: Secondary | ICD-10-CM | POA: Diagnosis not present

## 2020-07-25 DIAGNOSIS — Z20822 Contact with and (suspected) exposure to covid-19: Secondary | ICD-10-CM | POA: Diagnosis not present

## 2020-07-28 ENCOUNTER — Other Ambulatory Visit: Payer: Self-pay | Admitting: Hematology and Oncology

## 2020-07-28 ENCOUNTER — Inpatient Hospital Stay: Payer: BC Managed Care – PPO | Attending: Hematology and Oncology | Admitting: Hematology and Oncology

## 2020-07-28 ENCOUNTER — Inpatient Hospital Stay: Payer: BC Managed Care – PPO

## 2020-07-28 DIAGNOSIS — K625 Hemorrhage of anus and rectum: Secondary | ICD-10-CM | POA: Insufficient documentation

## 2020-07-28 DIAGNOSIS — D709 Neutropenia, unspecified: Secondary | ICD-10-CM | POA: Insufficient documentation

## 2020-07-28 DIAGNOSIS — Z5189 Encounter for other specified aftercare: Secondary | ICD-10-CM | POA: Insufficient documentation

## 2020-07-28 DIAGNOSIS — D696 Thrombocytopenia, unspecified: Secondary | ICD-10-CM | POA: Insufficient documentation

## 2020-07-28 DIAGNOSIS — C9200 Acute myeloblastic leukemia, not having achieved remission: Secondary | ICD-10-CM | POA: Insufficient documentation

## 2020-07-29 ENCOUNTER — Other Ambulatory Visit: Payer: Self-pay | Admitting: Hematology and Oncology

## 2020-07-29 ENCOUNTER — Inpatient Hospital Stay: Payer: BC Managed Care – PPO

## 2020-07-29 ENCOUNTER — Other Ambulatory Visit: Payer: Self-pay

## 2020-07-29 DIAGNOSIS — K625 Hemorrhage of anus and rectum: Secondary | ICD-10-CM | POA: Diagnosis not present

## 2020-07-29 DIAGNOSIS — D696 Thrombocytopenia, unspecified: Secondary | ICD-10-CM | POA: Diagnosis not present

## 2020-07-29 DIAGNOSIS — Z5189 Encounter for other specified aftercare: Secondary | ICD-10-CM | POA: Diagnosis not present

## 2020-07-29 DIAGNOSIS — C9201 Acute myeloblastic leukemia, in remission: Secondary | ICD-10-CM

## 2020-07-29 DIAGNOSIS — D709 Neutropenia, unspecified: Secondary | ICD-10-CM | POA: Diagnosis not present

## 2020-07-29 DIAGNOSIS — C9200 Acute myeloblastic leukemia, not having achieved remission: Secondary | ICD-10-CM | POA: Diagnosis not present

## 2020-07-29 MED ORDER — PEGFILGRASTIM-CBQV 6 MG/0.6ML ~~LOC~~ SOSY
6.0000 mg | PREFILLED_SYRINGE | Freq: Once | SUBCUTANEOUS | Status: AC
  Administered 2020-07-29: 6 mg via SUBCUTANEOUS

## 2020-07-29 MED ORDER — PEGFILGRASTIM-CBQV 6 MG/0.6ML ~~LOC~~ SOSY: 6.0000 mg | PREFILLED_SYRINGE | Freq: Once | SUBCUTANEOUS | Status: DC

## 2020-07-29 NOTE — Progress Notes (Signed)
Per MD patient got chemo at [facility] to get [facility] 12/3 at [place].

## 2020-07-30 NOTE — Progress Notes (Signed)
[facility]  [address], [address] [place], [place] [address] Phone: [phone]  Fax: [phone]   Clinic Day:  08/01/2020  Referring physician: Verl Bangs, FNP  Chief Complaint: Carl [name]. is a 44 y.o. male with AML who is seen for assessment on day 8 of cycle #1 HiDAC chemotherapy.  HPI: The patient was last seen in the medical oncology clinic on 07/14/2020. At that time, he had completed 2 cycles of cytarabine and daunorubicin (7+3) beginning 04/13/2020 and 05/27/2020.  Bone marrow on 07/05/2020 revealed 50% cellularity with 2% blasts.  MRD was negative by flow cytometry. He denied fevers, sweats or weight loss.  He had tenderness associated with a new port-a-cath.  He was given a small Rx for Percocet 5/325. He was scheduled to begin HiDAC on 07/25/2020.    He underwent tooth extraction (#17) on 07/26/2020.  He was admitted to [facility] on 07/25/2020 for cycle #1 HiDAC consolidation.  He received Udenyca on 07/29/2020 in [place].  [facility] labs followed: 07/25/2020: Hematocrit 42.4, hemoglobin 13.0, platelets 267,000, WBC 5,000. 07/26/2020: Hematocrit 41.2, hemoglobin 12.7, platelets 248,000, WBC 6,800. 07/27/2020: Hematocrit 39.4, hemoglobin 12.4, platelets 233,000, WBC 9,300. 07/28/2020: Hematocrit 39.5, hemoglobin 12.0, platelets 197,000, WBC 8,500.  During the interim, he starting itching everywhere 2 days ago after he received Udenyca. He does not have a rash. He has not used any new soaps, detergent, lotions, or colognes. He has not tried any new foods. He is going to pick up Claritin today.  His energy level is "beautiful." He denies fever, sweats, and weight loss. His left rib pain, shortness of breath, and pain due to his port have resolved.  He is taking valacyclovir and Levaquin. He uses prednisone eye drops every 4 hours. He denies any concerns.   History reviewed. No pertinent past medical history.  History reviewed. No pertinent  surgical history.  Family History  Problem Relation Age of Onset  . Prostate cancer Neg Hx   . Kidney cancer Neg Hx   . Bladder Cancer Neg Hx     Social History:  reports that he quit smoking about 4 months ago. His smoking use included cigarettes. He has a 17.00 pack-year smoking history. He has never used smokeless tobacco. He reports previous alcohol use of about 6.0 standard drinks of alcohol per week. He reports that he does not use drugs.The patient is alone today.  Allergies:  Allergies  Allergen Reactions  . Dapsone     Contraindication - G6PD deficient  . Primaquine     Contraindication - G6PD deficient  . Rasburicase     Contraindication - G6PD deficient    Current Medications: Current Outpatient Medications  Medication Sig Dispense Refill  . folic acid (FOLVITE) 1 MG tablet Take 1 tablet (1 mg total) by mouth daily.    Marland Kitchen HYDROcodone-acetaminophen (NORCO) 5-325 MG tablet Take 1 tablet by mouth every 6 (six) hours as needed for moderate pain. 12 tablet 0  . levofloxacin (LEVAQUIN) 500 MG tablet Take by mouth.    . magnesium oxide (MAG-OX) 400 MG tablet Take 800 mg by mouth daily.    . Multiple Vitamin (MULTIVITAMIN WITH MINERALS) TABS tablet Take 1 tablet by mouth daily.    . prednisoLONE acetate (PRED FORTE) 1 % ophthalmic suspension Administer 2 drops to both eyes every six (6) hours for 7 days.    Marland Kitchen thiamine 100 MG tablet Take 1 tablet (100 mg total) by mouth daily.    . valACYclovir (VALTREX) 500  MG tablet Take 500 mg by mouth daily.     No current facility-administered medications for this visit.    Review of Systems  Constitutional: Negative for chills, diaphoresis, fever, malaise/fatigue and weight loss (up 5 lbs).       Good energy.  HENT: Negative for congestion, ear discharge, ear pain, hearing loss, nosebleeds, sinus pain, sore throat and tinnitus.   Eyes: Negative for blurred vision.  Respiratory: Negative for cough, hemoptysis, sputum production and  shortness of breath.   Cardiovascular: Negative for chest pain, palpitations and leg swelling.  Gastrointestinal: Negative for abdominal pain, blood in stool, constipation, diarrhea, heartburn, melena, nausea and vomiting.       Eating well.  Genitourinary: Negative for dysuria, frequency, hematuria and urgency.  Musculoskeletal: Negative for back pain, joint pain, myalgias and neck pain.  Skin: Positive for itching (all over x 2 days). Negative for rash.  Neurological: Negative for dizziness, tingling, sensory change, weakness and headaches.  Endo/Heme/Allergies: Does not bruise/bleed easily.  Psychiatric/Behavioral: Negative for depression and memory loss. The patient is not nervous/anxious and does not have insomnia.   All other systems reviewed and are negative.  Performance status (ECOG): 0  Vitals Blood pressure 111/75, pulse 86, temperature 97.8 F (36.6 C), temperature source Tympanic, resp. rate 18, weight 187 lb 6.3 oz (85 kg), SpO2 100 %.   Physical Exam Vitals and nursing note reviewed.  Constitutional:      General: He is not in acute distress.    Appearance: He is not diaphoretic.  HENT:     Head: Normocephalic and atraumatic.     Mouth/Throat:     Mouth: Mucous membranes are moist.     Pharynx: Oropharynx is clear.  Eyes:     General: No scleral icterus.    Extraocular Movements: Extraocular movements intact.     Conjunctiva/sclera: Conjunctivae normal.     Pupils: Pupils are equal, round, and reactive to light.  Cardiovascular:     Rate and Rhythm: Normal rate and regular rhythm.     Heart sounds: Normal heart sounds. No murmur heard.   Pulmonary:     Effort: Pulmonary effort is normal. No respiratory distress.     Breath sounds: Normal breath sounds. No wheezing or rales.  Chest:     Chest wall: No tenderness.     Comments: Right chest port-a-cath unremarkable. Abdominal:     General: Bowel sounds are normal. There is no distension.     Palpations:  Abdomen is soft. There is no hepatomegaly, splenomegaly or mass.     Tenderness: There is no abdominal tenderness. There is no guarding or rebound.  Musculoskeletal:        General: No swelling or tenderness. Normal range of motion.     Cervical back: Normal range of motion and neck supple.     Right lower leg: No edema.     Left lower leg: No edema.  Lymphadenopathy:     Head:     Right side of head: No preauricular, posterior auricular or occipital adenopathy.     Left side of head: No preauricular, posterior auricular or occipital adenopathy.     Cervical: No cervical adenopathy.     Upper Body:     Right upper body: No supraclavicular or axillary adenopathy.     Left upper body: No supraclavicular or axillary adenopathy.     Lower Body: No right inguinal adenopathy. No left inguinal adenopathy.  Skin:    General: Skin is warm and dry.  Findings: No rash.  Neurological:     Mental Status: He is alert and oriented to person, place, and time.  Psychiatric:        Behavior: Behavior normal.        Thought Content: Thought content normal.        Judgment: Judgment normal.    Appointment on 08/01/2020  Component Date Value Ref Range Status  . Sodium 08/01/2020 134* 135 - 145 mmol/L Final  . Potassium 08/01/2020 4.2  3.5 - 5.1 mmol/L Final  . Chloride 08/01/2020 100  98 - 111 mmol/L Final  . CO2 08/01/2020 25  22 - 32 mmol/L Final  . Glucose, Bld 08/01/2020 104* 70 - 99 mg/dL Final   Glucose reference range applies only to samples taken after fasting for at least 8 hours.  . BUN 08/01/2020 14  6 - 20 mg/dL Final  . Creatinine, Ser 08/01/2020 0.98  0.61 - 1.24 mg/dL Final  . Calcium 08/01/2020 9.3  8.9 - 10.3 mg/dL Final  . Total Protein 08/01/2020 7.5  6.5 - 8.1 g/dL Final  . Albumin 08/01/2020 4.0  3.5 - 5.0 g/dL Final  . AST 08/01/2020 24  15 - 41 U/L Final  . ALT 08/01/2020 65* 0 - 44 U/L Final  . Alkaline Phosphatase 08/01/2020 101  38 - 126 U/L Final  . Total Bilirubin  08/01/2020 1.2  0.3 - 1.2 mg/dL Final  . GFR, Estimated 08/01/2020 >60  >60 mL/min Final   Comment: (NOTE) Calculated using the CKD-EPI Creatinine Equation (2021)   . Anion gap 08/01/2020 9  5 - 15 Final   Performed at [facility], [address] [place], [place] [address]  . WBC 08/01/2020 0.8* 4.0 - 10.5 K/uL Final   This critical result has verified and been called to [place] by Gennaro Africa on 12 06 2021 at 1404, and has been read back.   Marland Kitchen RBC 08/01/2020 3.66* 4.22 - 5.81 MIL/uL Final  . Hemoglobin 08/01/2020 10.9* 13.0 - 17.0 g/dL Final  . HCT 08/01/2020 33.2* 39 - 52 % Final  . MCV 08/01/2020 90.7  80.0 - 100.0 fL Final  . MCH 08/01/2020 29.8  26.0 - 34.0 pg Final  . MCHC 08/01/2020 32.8  30.0 - 36.0 g/dL Final  . RDW 08/01/2020 15.2  11.5 - 15.5 % Final  . Platelets 08/01/2020 51* 150 - 400 K/uL Final   Comment: Immature Platelet Fraction may be clinically indicated, consider ordering this additional test [id number]   . nRBC 08/01/2020 0.0  0.0 - 0.2 % Final  . Neutrophils Relative % 08/01/2020 82  % Final  . Neutro Abs 08/01/2020 0.6* 1.7 - 7.7 K/uL Final  . Lymphocytes Relative 08/01/2020 9  % Final  . Lymphs Abs 08/01/2020 0.1* 0.7 - 4.0 K/uL Final  . Monocytes Relative 08/01/2020 3  % Final  . Monocytes Absolute 08/01/2020 0.0* 0.1 - 1.0 K/uL Final  . Eosinophils Relative 08/01/2020 1  % Final  . Eosinophils Absolute 08/01/2020 0.0  0.0 - 0.5 K/uL Final  . Basophils Relative 08/01/2020 1  % Final  . Basophils Absolute 08/01/2020 0.0  0.0 - 0.1 K/uL Final  . Immature Granulocytes 08/01/2020 4  % Final  . Abs Immature Granulocytes 08/01/2020 0.03  0.00 - 0.07 K/uL Final   Performed at [facility], [address], [place], [place] [address]    Assessment:  Carl [name]. is a 44 y.o. 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  He has received 2 courses of induction chemotherapy with daunorubicin and  cytarabine.  Bone marrow on 04/01/2020 revealed AML.  Bone marrow on 04/26/2020 was hypocellular (5-10%) with 5% blasts.  Bone marrow on 05/24/2020 revealed 30% cellularity with 10% blasts and focal clustering up to 30-40%.  Bone marrow on 07/05/2020 revealed 50% cellularity with 2% blasts.  MRD was negative by flow cytometry.  He received cytarabine and daunorubicin (7+3) beginning 04/13/2020 and 05/27/2020.  Course has been complicated by fever and neutropenia with mucositis (04/27/2020) and gluteal cleft cellulitis (05/06/2020).  He is day 8 of cycle #1 HiDAC consolidation with Udenyca support (began 07/25/2020).  He has G6PD deficiency.  G6PD was 1.0 (5.3-10.3) on 04/02/2020.  He received the first COVID-19 vaccine on 07/27/2020.  Symptomatically, he feels great.  He denies any fevers, bruising or bleeding.  Exam is unremarkable.  Hemoglobin is 10.9, platelets 51,000, WBC 800 (ANC 600).  Plan: 1.   Labs today: CBC with diff, CMP, hold tube. 2.   Acute myelogenous leukemia  He is s/p 2 courses of induction daunorubicin and cytarabine.  He is day 8 of cycle #1 high-dose ara-C (07/25/2020) with Udencya support.   Continue supportive care:   Twice weekly labs after chemotherapy.   Blood products are leukoreduced and irradiated.   RBC transfusion threshold: transfuse 2 units for Hgb < 8 g/dL.  Discuss symptom management.  He has antiemetics at home to use on a prn bases.  Interventions are adequate.    3.   Thrombocytopenia  Platelets 51,000.  No bruising or bleeding.  Review no aspirin or ibuprofen.  Patient to contact clinic if any excess bruising or bleeding.  Discuss rechecking counts in 2 days secondary to precipitous drop.   May need lab checks 3 times a week.  Platelet transfusion threshold: transfuse 1 unit of platelets for platelet count < 10,000, or for bleeding or need for invasive procedure. 4.   Neutropenia  ANC 600.  Review fever and neutropenia precautions.  Patient  on prophylactic antibiotics. 5.   RTC on 08/03/2020 for CBC. 6.   RN:  Call patient with counts and if platelets needed. 7.   RTC on 08/08/2020 for MD assessment, labs (CBC with diff, type and hold).   I discussed the assessment and treatment plan with the patient.  The patient was provided an opportunity to ask questions and all were answered.  The patient agreed with the plan and demonstrated an understanding of the instructions.  The patient was advised to call back if the symptoms worsen or if the condition fails to improve as anticipated.  I provided 16 minutes of face-to-face time during this this encounter and > 50% was spent counseling as documented under my assessment and plan.   Jhoanna Heyde C. Mike Gip, MD, PhD    08/01/2020, 2:33 PM  I, Mirian Mo Tufford, am acting as Education administrator for [employer]. Mike Gip, MD, PhD.  I, Nejla Reasor C. Mike Gip, MD, have reviewed the above documentation for accuracy and completeness, and I agree with the above.

## 2020-08-01 ENCOUNTER — Encounter: Payer: Self-pay | Admitting: Hematology and Oncology

## 2020-08-01 ENCOUNTER — Inpatient Hospital Stay: Payer: BC Managed Care – PPO

## 2020-08-01 ENCOUNTER — Inpatient Hospital Stay (HOSPITAL_BASED_OUTPATIENT_CLINIC_OR_DEPARTMENT_OTHER): Payer: BC Managed Care – PPO | Admitting: Hematology and Oncology

## 2020-08-01 ENCOUNTER — Other Ambulatory Visit: Payer: Self-pay

## 2020-08-01 VITALS — BP 111/75 | HR 86 | Temp 97.8°F | Resp 18 | Wt 187.4 lb

## 2020-08-01 DIAGNOSIS — D701 Agranulocytosis secondary to cancer chemotherapy: Secondary | ICD-10-CM

## 2020-08-01 DIAGNOSIS — K625 Hemorrhage of anus and rectum: Secondary | ICD-10-CM | POA: Diagnosis not present

## 2020-08-01 DIAGNOSIS — D696 Thrombocytopenia, unspecified: Secondary | ICD-10-CM

## 2020-08-01 DIAGNOSIS — T451X5A Adverse effect of antineoplastic and immunosuppressive drugs, initial encounter: Secondary | ICD-10-CM

## 2020-08-01 DIAGNOSIS — D709 Neutropenia, unspecified: Secondary | ICD-10-CM | POA: Diagnosis not present

## 2020-08-01 DIAGNOSIS — C9200 Acute myeloblastic leukemia, not having achieved remission: Secondary | ICD-10-CM | POA: Diagnosis not present

## 2020-08-01 DIAGNOSIS — C9201 Acute myeloblastic leukemia, in remission: Secondary | ICD-10-CM

## 2020-08-01 DIAGNOSIS — Z5189 Encounter for other specified aftercare: Secondary | ICD-10-CM | POA: Diagnosis not present

## 2020-08-01 LAB — COMPREHENSIVE METABOLIC PANEL
ALT: 65 U/L — ABNORMAL HIGH (ref 0–44)
AST: 24 U/L (ref 15–41)
Albumin: 4 g/dL (ref 3.5–5.0)
Alkaline Phosphatase: 101 U/L (ref 38–126)
Anion gap: 9 (ref 5–15)
BUN: 14 mg/dL (ref 6–20)
CO2: 25 mmol/L (ref 22–32)
Calcium: 9.3 mg/dL (ref 8.9–10.3)
Chloride: 100 mmol/L (ref 98–111)
Creatinine, Ser: 0.98 mg/dL (ref 0.61–1.24)
GFR, Estimated: >60 mL/min (ref >60)
Glucose, Bld: 104 mg/dL — ABNORMAL HIGH (ref 70–99)
Potassium: 4.2 mmol/L (ref 3.5–5.1)
Sodium: 134 mmol/L — ABNORMAL LOW (ref 135–145)
Total Bilirubin: 1.2 mg/dL (ref 0.3–1.2)
Total Protein: 7.5 g/dL (ref 6.5–8.1)

## 2020-08-01 LAB — CBC WITH DIFFERENTIAL/PLATELET
Abs Immature Granulocytes: 0.03 10*3/uL (ref 0.00–0.07)
Basophils Absolute: 0 10*3/uL (ref 0.0–0.1)
Basophils Relative: 1 %
Eosinophils Absolute: 0 10*3/uL (ref 0.0–0.5)
Eosinophils Relative: 1 %
HCT: 33.2 % — ABNORMAL LOW (ref 39.0–52.0)
Hemoglobin: 10.9 g/dL — ABNORMAL LOW (ref 13.0–17.0)
Immature Granulocytes: 4 %
Lymphocytes Relative: 9 %
Lymphs Abs: 0.1 10*3/uL — ABNORMAL LOW (ref 0.7–4.0)
MCH: 29.8 pg (ref 26.0–34.0)
MCHC: 32.8 g/dL (ref 30.0–36.0)
MCV: 90.7 fL (ref 80.0–100.0)
Monocytes Absolute: 0 10*3/uL — ABNORMAL LOW (ref 0.1–1.0)
Monocytes Relative: 3 %
Neutro Abs: 0.6 10*3/uL — ABNORMAL LOW (ref 1.7–7.7)
Neutrophils Relative %: 82 %
Platelets: 51 10*3/uL — ABNORMAL LOW (ref 150–400)
RBC: 3.66 MIL/uL — ABNORMAL LOW (ref 4.22–5.81)
RDW: 15.2 % (ref 11.5–15.5)
WBC: 0.8 10*3/uL — CL (ref 4.0–10.5)
nRBC: 0 % (ref 0.0–0.2)

## 2020-08-01 LAB — TYPE AND SCREEN
ABO/RH(D): B POS
Antibody Screen: NEGATIVE

## 2020-08-01 LAB — SAMPLE TO BLOOD BANK

## 2020-08-01 NOTE — Patient Instructions (Signed)
  Call immediately if fever, chills, or doesn't feel good.  Call if unexplained bruising or bleeding.

## 2020-08-01 NOTE — Progress Notes (Signed)
Patient states he he has been itching all over since Saturday.  Patient states he hadn't picked up his Claritin because he thought it was for seasonal allergies. He said he is going to pick up the precripton today.

## 2020-08-01 NOTE — Progress Notes (Signed)
CRITICAL VALUE STICKER  CRITICAL VALUE: WBC 0.8  RECEIVER (on-site recipient of call): [place] NOTIFIED:  08/01/20 & 209p  MD NOTIFIED: Mike Gip  TIME OF NOTIFICATION: 210p

## 2020-08-03 ENCOUNTER — Other Ambulatory Visit: Payer: Self-pay

## 2020-08-03 ENCOUNTER — Other Ambulatory Visit: Payer: Self-pay | Admitting: Hematology and Oncology

## 2020-08-03 ENCOUNTER — Telehealth: Payer: Self-pay | Admitting: Hematology and Oncology

## 2020-08-03 ENCOUNTER — Inpatient Hospital Stay: Payer: BC Managed Care – PPO

## 2020-08-03 DIAGNOSIS — C9201 Acute myeloblastic leukemia, in remission: Secondary | ICD-10-CM

## 2020-08-03 DIAGNOSIS — C9200 Acute myeloblastic leukemia, not having achieved remission: Secondary | ICD-10-CM | POA: Diagnosis not present

## 2020-08-03 DIAGNOSIS — D696 Thrombocytopenia, unspecified: Secondary | ICD-10-CM

## 2020-08-03 DIAGNOSIS — D709 Neutropenia, unspecified: Secondary | ICD-10-CM | POA: Diagnosis not present

## 2020-08-03 DIAGNOSIS — Z5189 Encounter for other specified aftercare: Secondary | ICD-10-CM | POA: Diagnosis not present

## 2020-08-03 DIAGNOSIS — K625 Hemorrhage of anus and rectum: Secondary | ICD-10-CM | POA: Diagnosis not present

## 2020-08-03 LAB — CBC
HCT: 34.4 % — ABNORMAL LOW (ref 39.0–52.0)
Hemoglobin: 11.2 g/dL — ABNORMAL LOW (ref 13.0–17.0)
MCH: 29.6 pg (ref 26.0–34.0)
MCHC: 32.6 g/dL (ref 30.0–36.0)
MCV: 90.8 fL (ref 80.0–100.0)
Platelets: 12 10*3/uL — CL (ref 150–400)
RBC: 3.79 MIL/uL — ABNORMAL LOW (ref 4.22–5.81)
RDW: 14.7 % (ref 11.5–15.5)
WBC: 0.2 10*3/uL — CL (ref 4.0–10.5)
nRBC: 0 % (ref 0.0–0.2)

## 2020-08-03 LAB — TYPE AND SCREEN
ABO/RH(D): B POS
Antibody Screen: NEGATIVE

## 2020-08-03 NOTE — Telephone Encounter (Signed)
Re:  Blood counts  Today I contacted the patient regarding his blood counts.  Platelet count is 12,000.  WBC is 0.2.   He denied any bruising or bleeding.  He denied any fevers.  He feels fine.  We reviewed neutropenic precautions.    We discussed plans for transfusion of platelets in the morning.  If he has any concerns overnight, he is to contact the on-call physician.  The ER is aware if needed.   Lequita Asal, MD

## 2020-08-04 ENCOUNTER — Inpatient Hospital Stay: Payer: BC Managed Care – PPO

## 2020-08-04 ENCOUNTER — Other Ambulatory Visit: Payer: Self-pay

## 2020-08-04 DIAGNOSIS — D696 Thrombocytopenia, unspecified: Secondary | ICD-10-CM

## 2020-08-04 DIAGNOSIS — C9201 Acute myeloblastic leukemia, in remission: Secondary | ICD-10-CM

## 2020-08-04 DIAGNOSIS — Z5189 Encounter for other specified aftercare: Secondary | ICD-10-CM | POA: Diagnosis not present

## 2020-08-04 DIAGNOSIS — K625 Hemorrhage of anus and rectum: Secondary | ICD-10-CM | POA: Diagnosis not present

## 2020-08-04 DIAGNOSIS — C9200 Acute myeloblastic leukemia, not having achieved remission: Secondary | ICD-10-CM | POA: Diagnosis not present

## 2020-08-04 DIAGNOSIS — D709 Neutropenia, unspecified: Secondary | ICD-10-CM | POA: Diagnosis not present

## 2020-08-04 LAB — PLATELET COUNT: Platelets: 7 10*3/uL — CL (ref 150–400)

## 2020-08-04 MED ORDER — ACETAMINOPHEN 325 MG PO TABS
650.0000 mg | ORAL_TABLET | Freq: Once | ORAL | Status: AC
  Administered 2020-08-04: 650 mg via ORAL
  Filled 2020-08-04: qty 2

## 2020-08-04 MED ORDER — SODIUM CHLORIDE 0.9% IV SOLUTION
250.0000 mL | Freq: Once | INTRAVENOUS | Status: AC
  Administered 2020-08-04: 250 mL via INTRAVENOUS
  Filled 2020-08-04: qty 250

## 2020-08-04 MED ORDER — ACETAMINOPHEN 325 MG PO TABS
650.0000 mg | ORAL_TABLET | Freq: Once | ORAL | Status: AC
  Administered 2020-08-04: 650 mg via ORAL

## 2020-08-04 MED ORDER — DIPHENHYDRAMINE HCL 25 MG PO CAPS
25.0000 mg | ORAL_CAPSULE | Freq: Once | ORAL | Status: AC
  Administered 2020-08-04: 25 mg via ORAL

## 2020-08-04 MED ORDER — ACETAMINOPHEN 325 MG PO TABS
ORAL_TABLET | ORAL | Status: AC
  Filled 2020-08-04: qty 2

## 2020-08-04 MED ORDER — DIPHENHYDRAMINE HCL 25 MG PO CAPS
ORAL_CAPSULE | ORAL | Status: AC
  Filled 2020-08-04: qty 1

## 2020-08-04 MED ORDER — DIPHENHYDRAMINE HCL 25 MG PO CAPS
25.0000 mg | ORAL_CAPSULE | Freq: Once | ORAL | Status: AC
  Administered 2020-08-04: 25 mg via ORAL
  Filled 2020-08-04: qty 1

## 2020-08-04 MED ORDER — HEPARIN SOD (PORK) LOCK FLUSH 100 UNIT/ML IV SOLN
INTRAVENOUS | Status: AC
  Filled 2020-08-04: qty 5

## 2020-08-04 NOTE — Progress Notes (Signed)
Pt received one unit of platelets and tolerated well, VSS, d/ced home

## 2020-08-05 ENCOUNTER — Inpatient Hospital Stay: Payer: BC Managed Care – PPO

## 2020-08-05 ENCOUNTER — Other Ambulatory Visit: Payer: Self-pay

## 2020-08-05 ENCOUNTER — Inpatient Hospital Stay (HOSPITAL_BASED_OUTPATIENT_CLINIC_OR_DEPARTMENT_OTHER): Payer: BC Managed Care – PPO | Admitting: Oncology

## 2020-08-05 VITALS — BP 111/67 | HR 85 | Temp 99.0°F | Resp 18

## 2020-08-05 DIAGNOSIS — D701 Agranulocytosis secondary to cancer chemotherapy: Secondary | ICD-10-CM | POA: Insufficient documentation

## 2020-08-05 DIAGNOSIS — K625 Hemorrhage of anus and rectum: Secondary | ICD-10-CM

## 2020-08-05 DIAGNOSIS — C9200 Acute myeloblastic leukemia, not having achieved remission: Secondary | ICD-10-CM | POA: Diagnosis not present

## 2020-08-05 DIAGNOSIS — D696 Thrombocytopenia, unspecified: Secondary | ICD-10-CM

## 2020-08-05 DIAGNOSIS — C9201 Acute myeloblastic leukemia, in remission: Secondary | ICD-10-CM

## 2020-08-05 DIAGNOSIS — D709 Neutropenia, unspecified: Secondary | ICD-10-CM | POA: Diagnosis not present

## 2020-08-05 DIAGNOSIS — Z5189 Encounter for other specified aftercare: Secondary | ICD-10-CM | POA: Diagnosis not present

## 2020-08-05 DIAGNOSIS — T451X5A Adverse effect of antineoplastic and immunosuppressive drugs, initial encounter: Secondary | ICD-10-CM

## 2020-08-05 LAB — CBC WITH DIFFERENTIAL/PLATELET
Abs Immature Granulocytes: 0 10*3/uL (ref 0.00–0.07)
Basophils Absolute: 0 10*3/uL (ref 0.0–0.1)
Basophils Relative: 0 %
Eosinophils Absolute: 0 10*3/uL (ref 0.0–0.5)
Eosinophils Relative: 6 %
HCT: 32.9 % — ABNORMAL LOW (ref 39.0–52.0)
Hemoglobin: 10.6 g/dL — ABNORMAL LOW (ref 13.0–17.0)
Immature Granulocytes: 0 %
Lymphocytes Relative: 82 %
Lymphs Abs: 0.1 10*3/uL — ABNORMAL LOW (ref 0.7–4.0)
MCH: 29.2 pg (ref 26.0–34.0)
MCHC: 32.2 g/dL (ref 30.0–36.0)
MCV: 90.6 fL (ref 80.0–100.0)
Monocytes Absolute: 0 10*3/uL — ABNORMAL LOW (ref 0.1–1.0)
Monocytes Relative: 12 %
Neutro Abs: 0 10*3/uL — CL (ref 1.7–7.7)
Neutrophils Relative %: 0 %
Platelets: 21 10*3/uL — CL (ref 150–400)
RBC: 3.63 MIL/uL — ABNORMAL LOW (ref 4.22–5.81)
RDW: 14.6 % (ref 11.5–15.5)
WBC: 0.2 10*3/uL — CL (ref 4.0–10.5)
nRBC: 0 % (ref 0.0–0.2)

## 2020-08-05 LAB — BPAM PLATELET PHERESIS
Blood Product Expiration Date: 202112092359
Blood Product Expiration Date: 202112112359
ISSUE DATE / TIME: 202112091817
Unit Type and Rh: 5100
Unit Type and Rh: 7300

## 2020-08-05 LAB — PREPARE PLATELET PHERESIS
Unit division: 0
Unit division: 0

## 2020-08-05 NOTE — Progress Notes (Signed)
Symptom Management Consult note [facility]  [phone] [phone]  Patient Care Team: Verl Bangs, FNP as PCP - General (Family Medicine)   Name of the patient: Carl [name]  [id number]  11-11-1975   Date of visit: 08/05/2020   Diagnosis- AML  Chief complaint/ Reason for visit- Rectal Bleeding  Heme/Onc history:  Carl Gemayel Mascio. 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  He has received 2 courses of induction chemotherapy with daunorubicin and cytarabine.  Bone marrow on 04/01/2020 revealed AML.  Bone marrow on 04/26/2020 was hypocellular (5-10%) with 5% blasts.  Bone marrow on 05/24/2020 revealed 30% cellularity with 10% blasts and focal clustering up to 30-40%.  Bone marrow on 07/05/2020 revealed 50% cellularity with 2% blasts.  MRD was negative by flow cytometry.  He received cytarabine and daunorubicin (7+3) beginning 04/13/2020 and 05/27/2020.  Course has been complicated by fever and neutropenia with mucositis (04/27/2020) and gluteal cleft cellulitis (05/06/2020).  He is day 8 of cycle #1 HiDAC consolidation with Udenyca support (began 07/25/2020).  He has G6PD deficiency.  G6PD was 1.0 (5.3-10.3) on 04/02/2020.  He received the first COVID-19 vaccine on 07/27/2020.  Interval history-Carl [name] is a 44 year old male with past medical history significant for pancreatitis, pancytopenia, low back pain who was recently diagnosed with AML who is treated by [facility] and followed here locally secondary to proximity to his home.  He is status post cycle 1 of HiDAC consolidation on 07/25/20.  He was found to have a platelet count of 7000 and given 1 unit of platelets.  Today he presents back to clinic for a repeat CBC.  Carl [name] noticed 2 episodes of rectal bleeding since his platelet transfusion.  He had 1 yesterday evening that was bright red blood on toilet paper.  He did not notice any in  his stool.  This morning he had a bowel movement with additional blood in his stool and on his tissue paper.  Has had previous hemorrhoidal bleeding in the past.  He denies any other episodes of bleeding such as epistasis or hematuria.  He denies any pain.  He feels well otherwise.  Has noticed over the past 24 to 48 hours right eye blurriness.  Denies any other neurological complaints including headache.  He denies any nausea or vomiting.  Denies chest pain or shortness of breath.  ECOG FS:0 - Asymptomatic  Review of systems- Review of Systems  HENT:       Blurry vision in right eye  Gastrointestinal: Positive for blood in stool.     Current treatment- s/p Cylce 1 HiDAC at [facility] on 07/25/20  Allergies  Allergen Reactions   Dapsone     Contraindication - G6PD deficient   Primaquine     Contraindication - G6PD deficient   Rasburicase     Contraindication - G6PD deficient     No past medical history on file.   No past surgical history on file.  Social History   Socioeconomic History   Marital status: Single    Spouse name: Not on file   Number of children: Not on file   Years of education: Not on file   Highest education level: Not on file  Occupational History   Not on file  Tobacco Use   Smoking status: Former Smoker    Packs/day: 1.00    Years: 17.00    Pack years: 17.00    Types: Cigarettes  Quit date: 03/27/2020    Years since quitting: 0.3   Smokeless tobacco: Never Used  Vaping Use   Vaping Use: Never used  Substance and Sexual Activity   Alcohol use: Not Currently    Alcohol/week: 6.0 standard drinks    Types: 6 Cans of beer per week    Comment: 6-24oz beers on weekly bases   Drug use: Never    Types: Marijuana   Sexual activity: Not on file  Other Topics Concern   Not on file  Social History Narrative   Not on file   Social Determinants of Health   Financial Resource Strain: Not on file  Food Insecurity: Not on file   Transportation Needs: Not on file  Physical Activity: Not on file  Stress: Not on file  Social Connections: Not on file  Intimate Partner Violence: Not on file    Family History  Problem Relation Age of Onset   Prostate cancer Neg Hx    Kidney cancer Neg Hx    Bladder Cancer Neg Hx      Current Outpatient Medications:    folic acid (FOLVITE) 1 MG tablet, Take 1 tablet (1 mg total) by mouth daily., Disp: , Rfl:    HYDROcodone-acetaminophen (NORCO) 5-325 MG tablet, Take 1 tablet by mouth every 6 (six) hours as needed for moderate pain., Disp: 12 tablet, Rfl: 0   levofloxacin (LEVAQUIN) 500 MG tablet, Take by mouth., Disp: , Rfl:    magnesium oxide (MAG-OX) 400 MG tablet, Take 800 mg by mouth daily., Disp: , Rfl:    Multiple Vitamin (MULTIVITAMIN WITH MINERALS) TABS tablet, Take 1 tablet by mouth daily., Disp: , Rfl:    prednisoLONE acetate (PRED FORTE) 1 % ophthalmic suspension, Administer 2 drops to both eyes every six (6) hours for 7 days., Disp: , Rfl:    thiamine 100 MG tablet, Take 1 tablet (100 mg total) by mouth daily., Disp: , Rfl:    valACYclovir (VALTREX) 500 MG tablet, Take 500 mg by mouth daily., Disp: , Rfl:   Physical exam:  Vitals:   08/05/20 1036  BP: 111/67  Pulse: 85  Resp: 18  Temp: 99 F (37.2 C)  TempSrc: Tympanic  SpO2: 100%   Physical Exam Constitutional:      General: Vital signs are normal.     Appearance: Normal appearance.  HENT:     Head: Normocephalic and atraumatic.  Eyes:     Pupils: Pupils are equal, round, and reactive to light.  Cardiovascular:     Rate and Rhythm: Normal rate and regular rhythm.     Heart sounds: Normal heart sounds. No murmur heard.   Pulmonary:     Effort: Pulmonary effort is normal.     Breath sounds: Normal breath sounds. No wheezing.  Abdominal:     General: Bowel sounds are normal. There is no distension.     Palpations: Abdomen is soft.     Tenderness: There is no abdominal tenderness.   Musculoskeletal:        General: No edema. Normal range of motion.     Cervical back: Normal range of motion.  Skin:    General: Skin is warm and dry.     Findings: No rash.  Neurological:     Mental Status: He is alert and oriented to person, place, and time.  Psychiatric:        Judgment: Judgment normal.      CMP Latest Ref Rng & Units 08/01/2020  Glucose 70 - 99  mg/dL 104(H)  BUN 6 - 20 mg/dL 14  Creatinine 0.61 - 1.24 mg/dL 0.98  Sodium 135 - 145 mmol/L 134(L)  Potassium 3.5 - 5.1 mmol/L 4.2  Chloride 98 - 111 mmol/L 100  CO2 22 - 32 mmol/L 25  Calcium 8.9 - 10.3 mg/dL 9.3  Total Protein 6.5 - 8.1 g/dL 7.5  Total Bilirubin 0.3 - 1.2 mg/dL 1.2  Alkaline Phos 38 - 126 U/L 101  AST 15 - 41 U/L 24  ALT 0 - 44 U/L 65(H)   CBC Latest Ref Rng & Units 08/05/2020  WBC 4.0 - 10.5 K/uL 0.2(LL)  Hemoglobin 13.0 - 17.0 g/dL 10.6(L)  Hematocrit 39.0 - 52.0 % 32.9(L)  Platelets 150 - 400 K/uL 21(LL)    No images are attached to the encounter.  No results found.   Assessment and plan- Patient is a 44 y.o. male who presents to symptom management today for rectal bleeding.  Assessment: AML: -Followed by Festus Aloe. -He is s/p 1 cycle of HiDAC given on 07/25/2020. -He developed pancytopenia after cycle 1-plt 7000, anc 0.0, abc 0.2 and hemoglobin 10.6. -He received 1 unit of platelets yesterday with improvement of his counts up to 21,000 as of today. -Started having rectal bleeding yesterday afternoon-has had 2 episodes with bright red blood on tissue paper -Has history of hemorrhoidal bleeding. -On Levaquin 500 mg daily prohalactically   Plan: AML: -Spoke with Dr. Mike Gip who predicts his platelet count will continue to drop over the weekend. -Given his Merom is 0 and he has rectal bleeding, we are reluctant to d/c him home. -Have recommended observation over the weekend to keep an eye on his blood counts and make sure he does not develop an infection. -Patient is agreeable but  unfortunately there are no beds here at [facility] and would prefer he not sit in the emergency room given his neutropenia. -I have reached out to [facility] hematology/oncology to see if they have any availability or can offer any additional recommendations. --[facility] was able to accommodate patient for an additional platelet transfusion today.  --He was asked to report to [facility] center asap.  Disposition: -[facility] cancer center today for additional platelet transfusion.  --RTC as scheduled on Monday for labs and assessment.   Visit Diagnosis 1. Acute myeloid leukemia in remission ([place])   2. Thrombocytopenia ([place])   3. Rectal bleed     Patient expressed understanding and was in agreement with this plan. He also understands that He can call clinic at any time with any questions, concerns, or complaints.   Greater than 50% was spent in counseling and coordination of care with this patient including but not limited to discussion of the relevant topics above (See A&P) including, but not limited to diagnosis and management of acute and chronic medical conditions.   Thank you for allowing me to participate in the care of this very pleasant patient.    Jacquelin Hawking, NP [place] at [facility] Cell - [phone] Pager- [phone] 08/06/2020 9:34 AM

## 2020-08-07 NOTE — Progress Notes (Signed)
[facility]  [address], [address] [place], [place] [address] Phone: [phone]  Fax: [phone]   Clinic Day:  08/08/2020  Referring physician: Verl Bangs, FNP  Chief Complaint: Carl [name]. is a 44 y.o. male with AML who is seen for assessment on day 15 of cycle #1 HiDAC chemotherapy.  HPI: The patient was last seen in the medical oncology clinic on 08/01/2020. At that time, he felt great.  He denied any fevers, bruising or bleeding.  Exam was unremarkable. Hematocrit 33.2, hemoglobin 10.9, MCV 90.7, platelets 51,000, WBC 800 (ANC 600).  The patient was contacted on 08/03/2020 regarding his blood counts. Hematocrit was 34.4, hemoglobin 11.2, MCV 90.8), platelets 12,000, WBC 200. He denied bruising, bleeding, and fevers. Neutropenic precautions were reviewed. He was to come in in the morning for a platelet transfusion.  Platelet count on 08/04/2020 was 7,000. He received 1 unit of leukopoor and irradiated platelets.  The patient saw Faythe Casa, NP on 08/05/2020 in the symptoms management clinic. He reported 2 episodes of rectal bleeding since his platelet transfusion. He denied any other bleeding and felt well otherwise. He did note right eye blurriness over the past 1-2 days. Hematocrit was 32.9, hemoglobin 10.6, MCV 90.6, platelets 21,000, WBC 200 (ANC 0.0). He was to go to [facility] center for another platelet transfusion because there were no beds at [facility]  The patient saw Evorn Gong, Rexford Maus on 08/05/2020. He received 1 unit of pheresed platelets.  Post platelet count was 57,000.  During the interim, he has been "ok". The patient's rectal bleeding began again on Saturday afternoon. He noticed a few spots of blood on the toilet paper in the afternoon and at about 7 pm. He was also having some rectal pain. He denies straining or constipation. He has not had any more bleeding since then.  The patient denies nausea, vomiting, diarrhea, nose  bleeds, gum bleeds, and hematuria. He checks his temperature at home and has not had any fevers. He is eating well. He has small red spots (petechiae) on his upper arms.  He has not made a decision about a transplant yet.   Past Medical History:  Diagnosis Date  . Acute myeloid leukemia in remission ([place])   . Agranulocytosis secondary to cancer chemotherapy (CODE) ([place])   . Chemotherapy induced neutropenia ([place])   . Pancreatitis   . Pancytopenia ([place])   . Thrombocytopenia ([place])   . Tobacco use disorder     No past surgical history on file.  Family History  Problem Relation Age of Onset  . Prostate cancer Neg Hx   . Kidney cancer Neg Hx   . Bladder Cancer Neg Hx     Social History:  reports that he quit smoking about 5 months ago. His smoking use included cigarettes. He has a 17.00 pack-year smoking history. He has never used smokeless tobacco. He reports previous alcohol use of about 6.0 standard drinks of alcohol per week. He reports that he does not use drugs.The patient is alone today.  Allergies:  Allergies  Allergen Reactions  . Dapsone     Contraindication - G6PD deficient  . Primaquine     Contraindication - G6PD deficient  . Rasburicase     Contraindication - G6PD deficient    Current Medications: Current Outpatient Medications  Medication Sig Dispense Refill  . folic acid (FOLVITE) 1 MG tablet Take 1 tablet (1 mg total) by mouth daily. (Patient not taking: Reported on 08/15/2020)    .  HYDROcodone-acetaminophen (NORCO) 5-325 MG tablet Take 1 tablet by mouth every 6 (six) hours as needed for moderate pain. (Patient not taking: Reported on 08/15/2020) 12 tablet 0  . levofloxacin (LEVAQUIN) 500 MG tablet Take by mouth. (Patient not taking: Reported on 08/15/2020)    . Multiple Vitamin (MULTIVITAMIN WITH MINERALS) TABS tablet Take 1 tablet by mouth daily.    . prochlorperazine (COMPAZINE) 10 MG tablet Take by mouth. (Patient not taking: Reported on 08/15/2020)    .  thiamine 100 MG tablet Take 1 tablet (100 mg total) by mouth daily.    . valACYclovir (VALTREX) 500 MG tablet Take 500 mg by mouth daily.    . magnesium oxide (MAG-OX) 400 MG tablet Take 800 mg by mouth daily. (Patient not taking: No sig reported)     No current facility-administered medications for this visit.    Review of Systems  Constitutional: Negative for chills, diaphoresis, fever, malaise/fatigue and weight loss (up 2 lbs).       Feels "alright".  HENT: Negative for congestion, ear discharge, ear pain, hearing loss, nosebleeds, sinus pain, sore throat and tinnitus.   Eyes: Negative for blurred vision.  Respiratory: Negative for cough, hemoptysis, sputum production and shortness of breath.   Cardiovascular: Negative for chest pain, palpitations and leg swelling.  Gastrointestinal: Positive for blood in stool (on toilet paper). Negative for abdominal pain, constipation, diarrhea, heartburn, melena, nausea and vomiting.  Genitourinary: Negative for dysuria, frequency, hematuria and urgency.  Musculoskeletal: Negative for back pain, joint pain, myalgias and neck pain.  Skin: Negative for itching and rash.       Petechiae upper extremities.  Neurological: Negative for dizziness, tingling, sensory change, weakness and headaches.  Endo/Heme/Allergies: Does not bruise/bleed easily.  Psychiatric/Behavioral: Negative for depression and memory loss. The patient is not nervous/anxious and does not have insomnia.   All other systems reviewed and are negative.  Performance status (ECOG): 0  Vitals Blood pressure 128/67, pulse (!) 105, temperature (!) 97.3 F (36.3 C), temperature source Tympanic, resp. rate 16, weight 189 lb 2.5 oz (85.8 kg), SpO2 100 %.   Physical Exam Vitals and nursing note reviewed.  Constitutional:      General: He is not in acute distress.    Appearance: He is not diaphoretic.  HENT:     Head: Normocephalic and atraumatic.     Mouth/Throat:     Mouth: Mucous  membranes are moist.     Pharynx: Oropharynx is clear.  Eyes:     General: No scleral icterus.    Extraocular Movements: Extraocular movements intact.     Conjunctiva/sclera: Conjunctivae normal.     Pupils: Pupils are equal, round, and reactive to light.  Cardiovascular:     Rate and Rhythm: Normal rate and regular rhythm.     Heart sounds: Normal heart sounds. No murmur heard.   Pulmonary:     Effort: Pulmonary effort is normal. No respiratory distress.     Breath sounds: Normal breath sounds. No wheezing or rales.  Chest:     Chest wall: No tenderness.  Breasts:     Right: No axillary adenopathy or supraclavicular adenopathy.     Left: No axillary adenopathy or supraclavicular adenopathy.      Comments: Right chest port-a-cath unremarkable. Abdominal:     General: Bowel sounds are normal. There is no distension.     Palpations: Abdomen is soft. There is no hepatomegaly, splenomegaly or mass.     Tenderness: There is no abdominal tenderness. There is no  guarding or rebound.  Musculoskeletal:        General: No swelling or tenderness. Normal range of motion.     Cervical back: Normal range of motion and neck supple.     Right lower leg: No edema.     Left lower leg: No edema.  Lymphadenopathy:     Head:     Right side of head: No preauricular, posterior auricular or occipital adenopathy.     Left side of head: No preauricular, posterior auricular or occipital adenopathy.     Cervical: No cervical adenopathy.     Upper Body:     Right upper body: No supraclavicular or axillary adenopathy.     Left upper body: No supraclavicular or axillary adenopathy.     Lower Body: No right inguinal adenopathy. No left inguinal adenopathy.  Skin:    General: Skin is warm and dry.     Findings: No rash.     Comments: Upper extremity petechiae felt secondary to blood pressure cuff.  Neurological:     Mental Status: He is alert and oriented to person, place, and time.  Psychiatric:         Behavior: Behavior normal.        Thought Content: Thought content normal.        Judgment: Judgment normal.    Appointment on 08/08/2020  Component Date Value Ref Range Status  . WBC 08/08/2020 3.3* 4.0 - 10.5 K/uL Final  . RBC 08/08/2020 3.47* 4.22 - 5.81 MIL/uL Final  . Hemoglobin 08/08/2020 10.1* 13.0 - 17.0 g/dL Final  . HCT 08/08/2020 30.9* 39.0 - 52.0 % Final  . MCV 08/08/2020 89.0  80.0 - 100.0 fL Final  . MCH 08/08/2020 29.1  26.0 - 34.0 pg Final  . MCHC 08/08/2020 32.7  30.0 - 36.0 g/dL Final  . RDW 08/08/2020 14.6  11.5 - 15.5 % Final  . Platelets 08/08/2020 18* 150 - 400 K/uL Final   Comment: Immature Platelet Fraction may be clinically indicated, consider ordering this additional test [id number] THIS CRITICAL RESULT HAS VERIFIED AND BEEN CALLED TO ZARIA JOHNSON BY SANDRA FAYE WILLIAMSON ON 12 13 2021 AT 7017, AND HAS BEEN READ BACK.    Marland Kitchen nRBC 08/08/2020 0.0  0.0 - 0.2 % Final  . Neutrophils Relative % 08/08/2020 52  % Final  . Neutro Abs 08/08/2020 2.0  1.7 - 7.7 K/uL Final  . Band Neutrophils 08/08/2020 10  % Final  . Lymphocytes Relative 08/08/2020 14  % Final  . Lymphs Abs 08/08/2020 0.5* 0.7 - 4.0 K/uL Final  . Monocytes Relative 08/08/2020 12  % Final  . Monocytes Absolute 08/08/2020 0.4  0.1 - 1.0 K/uL Final  . Eosinophils Relative 08/08/2020 4  % Final  . Eosinophils Absolute 08/08/2020 0.1  0.0 - 0.5 K/uL Final  . Basophils Relative 08/08/2020 0  % Final  . Basophils Absolute 08/08/2020 0.0  0.0 - 0.1 K/uL Final  . WBC Morphology 08/08/2020 SMUDGE CELLS   Final  . RBC Morphology 08/08/2020 NO SCHISTOCYTES SEEN   Final  . Smear Review 08/08/2020 Reviewed   Final  . Metamyelocytes Relative 08/08/2020 6  % Final  . Myelocytes 08/08/2020 2  % Final  . Abs Immature Granulocytes 08/08/2020 0.30* 0.00 - 0.07 K/uL Final  . Abnormal Lymphocytes Present 08/08/2020 PRESENT   Final  . Tear Drop Cells 08/08/2020 PRESENT   Final  . Ovalocytes 08/08/2020 PRESENT   Final    Performed at [facility], [phone]  [address] [place], [place] [address]  . Blood Bank Specimen 08/08/2020 SAMPLE AVAILABLE FOR TESTING   Final  . Sample Expiration 08/08/2020    Final                   Value:08/11/2020,2359 Performed at [facility], [address] [place], [place] [address]     Assessment:  Prospero Taevion Sikora. 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  He has received 2 courses of induction chemotherapy with daunorubicin and cytarabine.  Bone marrow on 04/01/2020 revealed AML.  Bone marrow on 04/26/2020 was hypocellular (5-10%) with 5% blasts.  Bone marrow on 05/24/2020 revealed 30% cellularity with 10% blasts and focal clustering up to 30-40%.  Bone marrow on 07/05/2020 revealed 50% cellularity with 2% blasts.  MRD was negative by flow cytometry.  He received cytarabine and daunorubicin (7+3) beginning 04/13/2020 and 05/27/2020.  Course has been complicated by fever and neutropenia with mucositis (04/27/2020) and gluteal cleft cellulitis (05/06/2020).  He is day 15 of cycle #1 HiDAC consolidation with Udenyca support (began 07/25/2020).  He has required 2 platelet transfusions (08/04/2020 and 08/05/2020).  He has G6PD deficiency.  G6PD was 1.0 (5.3-10.3) on 04/02/2020.  He received the first COVID-19 vaccine on 07/27/2020.  Symptomatically, he feels "alright".  He has had some rectal bleeding.  Exam reveals bilateral upper extremity petechiae.  Plan: 1.   Labs today: CBC with diff, type and hold. 2.   Acute myelogenous leukemia  He is s/p 2 courses of induction daunorubicin and cytarabine.  He is day 15 of cycle #1 high-dose ara-C (07/25/2020) with Udencya support.   Continue supportive care:   Blood products are leukoreduced and irradiated.   RBC transfusion threshold: transfuse 2 units for Hgb < 8 g/dL   Platelet transfusion if platelets < 10,000 or bleeding.  Discuss symptom management.  He has antiemetics  at home to use on a prn bases.  Interventions are adequate. 3.   Thrombocytopenia  Platelets 18,000.  He has some minor rectal bleeding.  Patient to contact clinic if any excess bruising or bleeding.  Given borderline platelet count, check daily CBC until platelets recover.  No platelet transfusion today. 4.   Neutropenia  WBC 3300 with an [place].  Patient on prophylactic antibiotics. 5.   RTC daily in AM this week for labs (CBC with diff) and +/- platelet transfusion. 6.   RTC in 1 week for MD assessment and labs (CBC with diff).   I discussed the assessment and treatment plan with the patient.  The patient was provided an opportunity to ask questions and all were answered.  The patient agreed with the plan and demonstrated an understanding of the instructions.  The patient was advised to call back if the symptoms worsen or if the condition fails to improve as anticipated.  I provided 14 minutes of face-to-face time during this this encounter and > 50% was spent counseling as documented under my assessment and plan.  An additional 10 minutes were spent reviewing his chart (Epic and Care Everywhere) including notes, labs, and imaging studies.    Melissa C. Mike Gip, MD, PhD    08/08/2020, 4:32 PM  Carleene Cooper, am acting as Education administrator for [employer]. Mike Gip, MD, PhD.  I, Melissa C. Mike Gip, MD, have reviewed the above documentation for accuracy and completeness, and I agree with the above.

## 2020-08-08 ENCOUNTER — Other Ambulatory Visit: Payer: Self-pay

## 2020-08-08 ENCOUNTER — Inpatient Hospital Stay: Payer: BC Managed Care – PPO

## 2020-08-08 ENCOUNTER — Inpatient Hospital Stay (HOSPITAL_BASED_OUTPATIENT_CLINIC_OR_DEPARTMENT_OTHER): Payer: BC Managed Care – PPO | Admitting: Hematology and Oncology

## 2020-08-08 ENCOUNTER — Encounter: Payer: Self-pay | Admitting: Hematology and Oncology

## 2020-08-08 VITALS — BP 128/67 | HR 105 | Temp 97.3°F | Resp 16 | Wt 189.2 lb

## 2020-08-08 DIAGNOSIS — C9201 Acute myeloblastic leukemia, in remission: Secondary | ICD-10-CM | POA: Diagnosis not present

## 2020-08-08 DIAGNOSIS — K625 Hemorrhage of anus and rectum: Secondary | ICD-10-CM | POA: Diagnosis not present

## 2020-08-08 DIAGNOSIS — D696 Thrombocytopenia, unspecified: Secondary | ICD-10-CM | POA: Diagnosis not present

## 2020-08-08 DIAGNOSIS — T451X5A Adverse effect of antineoplastic and immunosuppressive drugs, initial encounter: Secondary | ICD-10-CM

## 2020-08-08 DIAGNOSIS — C9200 Acute myeloblastic leukemia, not having achieved remission: Secondary | ICD-10-CM | POA: Diagnosis not present

## 2020-08-08 DIAGNOSIS — D701 Agranulocytosis secondary to cancer chemotherapy: Secondary | ICD-10-CM

## 2020-08-08 DIAGNOSIS — Z5189 Encounter for other specified aftercare: Secondary | ICD-10-CM | POA: Diagnosis not present

## 2020-08-08 DIAGNOSIS — D709 Neutropenia, unspecified: Secondary | ICD-10-CM | POA: Diagnosis not present

## 2020-08-08 LAB — CBC WITH DIFFERENTIAL/PLATELET
Abs Immature Granulocytes: 0.3 10*3/uL — ABNORMAL HIGH (ref 0.00–0.07)
Band Neutrophils: 10 %
Basophils Absolute: 0 10*3/uL (ref 0.0–0.1)
Basophils Relative: 0 %
Eosinophils Absolute: 0.1 10*3/uL (ref 0.0–0.5)
Eosinophils Relative: 4 %
HCT: 30.9 % — ABNORMAL LOW (ref 39.0–52.0)
Hemoglobin: 10.1 g/dL — ABNORMAL LOW (ref 13.0–17.0)
Lymphocytes Relative: 14 %
Lymphs Abs: 0.5 10*3/uL — ABNORMAL LOW (ref 0.7–4.0)
MCH: 29.1 pg (ref 26.0–34.0)
MCHC: 32.7 g/dL (ref 30.0–36.0)
MCV: 89 fL (ref 80.0–100.0)
Metamyelocytes Relative: 6 %
Monocytes Absolute: 0.4 10*3/uL (ref 0.1–1.0)
Monocytes Relative: 12 %
Myelocytes: 2 %
Neutro Abs: 2 10*3/uL (ref 1.7–7.7)
Neutrophils Relative %: 52 %
Platelets: 18 10*3/uL — CL (ref 150–400)
RBC Morphology: NONE SEEN
RBC: 3.47 MIL/uL — ABNORMAL LOW (ref 4.22–5.81)
RDW: 14.6 % (ref 11.5–15.5)
WBC: 3.3 10*3/uL — ABNORMAL LOW (ref 4.0–10.5)
nRBC: 0 % (ref 0.0–0.2)

## 2020-08-08 LAB — SAMPLE TO BLOOD BANK

## 2020-08-09 ENCOUNTER — Inpatient Hospital Stay: Payer: BC Managed Care – PPO

## 2020-08-09 DIAGNOSIS — D709 Neutropenia, unspecified: Secondary | ICD-10-CM | POA: Diagnosis not present

## 2020-08-09 DIAGNOSIS — C9200 Acute myeloblastic leukemia, not having achieved remission: Secondary | ICD-10-CM | POA: Diagnosis not present

## 2020-08-09 DIAGNOSIS — D696 Thrombocytopenia, unspecified: Secondary | ICD-10-CM | POA: Diagnosis not present

## 2020-08-09 DIAGNOSIS — K625 Hemorrhage of anus and rectum: Secondary | ICD-10-CM | POA: Diagnosis not present

## 2020-08-09 DIAGNOSIS — Z5189 Encounter for other specified aftercare: Secondary | ICD-10-CM | POA: Diagnosis not present

## 2020-08-09 DIAGNOSIS — C9201 Acute myeloblastic leukemia, in remission: Secondary | ICD-10-CM

## 2020-08-09 LAB — CBC WITH DIFFERENTIAL/PLATELET
Abs Immature Granulocytes: 0.4 10*3/uL — ABNORMAL HIGH (ref 0.00–0.07)
Band Neutrophils: 14 %
Basophils Absolute: 0 10*3/uL (ref 0.0–0.1)
Basophils Relative: 0 %
Eosinophils Absolute: 0.1 10*3/uL (ref 0.0–0.5)
Eosinophils Relative: 1 %
HCT: 31 % — ABNORMAL LOW (ref 39.0–52.0)
Hemoglobin: 10.1 g/dL — ABNORMAL LOW (ref 13.0–17.0)
Lymphocytes Relative: 8 %
Lymphs Abs: 0.5 10*3/uL — ABNORMAL LOW (ref 0.7–4.0)
MCH: 29.2 pg (ref 26.0–34.0)
MCHC: 32.6 g/dL (ref 30.0–36.0)
MCV: 89.6 fL (ref 80.0–100.0)
Metamyelocytes Relative: 5 %
Monocytes Absolute: 0.8 10*3/uL (ref 0.1–1.0)
Monocytes Relative: 12 %
Myelocytes: 2 %
Neutro Abs: 4.6 10*3/uL (ref 1.7–7.7)
Neutrophils Relative %: 58 %
Platelets: 21 10*3/uL — CL (ref 150–400)
RBC Morphology: NONE SEEN
RBC: 3.46 MIL/uL — ABNORMAL LOW (ref 4.22–5.81)
RDW: 14.6 % (ref 11.5–15.5)
WBC: 6.4 10*3/uL (ref 4.0–10.5)
nRBC: 0 % (ref 0.0–0.2)

## 2020-08-09 NOTE — Progress Notes (Unsigned)
Platelet count 21 today, called and spoke with blood bank, no platelets today but will be checking everyday this week and next for possible transfusions. Pt d/ced home

## 2020-08-10 ENCOUNTER — Inpatient Hospital Stay: Payer: BC Managed Care – PPO

## 2020-08-10 ENCOUNTER — Other Ambulatory Visit: Payer: Self-pay

## 2020-08-10 DIAGNOSIS — C9201 Acute myeloblastic leukemia, in remission: Secondary | ICD-10-CM

## 2020-08-10 DIAGNOSIS — Z5189 Encounter for other specified aftercare: Secondary | ICD-10-CM | POA: Diagnosis not present

## 2020-08-10 DIAGNOSIS — C9200 Acute myeloblastic leukemia, not having achieved remission: Secondary | ICD-10-CM | POA: Diagnosis not present

## 2020-08-10 DIAGNOSIS — K625 Hemorrhage of anus and rectum: Secondary | ICD-10-CM | POA: Diagnosis not present

## 2020-08-10 DIAGNOSIS — D696 Thrombocytopenia, unspecified: Secondary | ICD-10-CM | POA: Diagnosis not present

## 2020-08-10 DIAGNOSIS — D709 Neutropenia, unspecified: Secondary | ICD-10-CM | POA: Diagnosis not present

## 2020-08-10 LAB — CBC WITH DIFFERENTIAL/PLATELET
Abs Immature Granulocytes: 0.7 10*3/uL — ABNORMAL HIGH (ref 0.00–0.07)
Band Neutrophils: 4 %
Basophils Absolute: 0 10*3/uL (ref 0.0–0.1)
Basophils Relative: 0 %
Eosinophils Absolute: 0.1 10*3/uL (ref 0.0–0.5)
Eosinophils Relative: 1 %
HCT: 29.5 % — ABNORMAL LOW (ref 39.0–52.0)
Hemoglobin: 9.6 g/dL — ABNORMAL LOW (ref 13.0–17.0)
Lymphocytes Relative: 9 %
Lymphs Abs: 0.8 10*3/uL (ref 0.7–4.0)
MCH: 29.2 pg (ref 26.0–34.0)
MCHC: 32.5 g/dL (ref 30.0–36.0)
MCV: 89.7 fL (ref 80.0–100.0)
Metamyelocytes Relative: 5 %
Monocytes Absolute: 1 10*3/uL (ref 0.1–1.0)
Monocytes Relative: 11 %
Myelocytes: 2 %
Neutro Abs: 6.7 10*3/uL (ref 1.7–7.7)
Neutrophils Relative %: 68 %
Platelets: 38 10*3/uL — ABNORMAL LOW (ref 150–400)
RBC Morphology: NONE SEEN
RBC: 3.29 MIL/uL — ABNORMAL LOW (ref 4.22–5.81)
RDW: 14.8 % (ref 11.5–15.5)
WBC: 9.3 10*3/uL (ref 4.0–10.5)
nRBC: 0 % (ref 0.0–0.2)

## 2020-08-10 NOTE — Progress Notes (Unsigned)
Pt's platelets 38 this am, pt for d/c home, no tx today

## 2020-08-11 ENCOUNTER — Inpatient Hospital Stay: Payer: BC Managed Care – PPO

## 2020-08-11 DIAGNOSIS — D709 Neutropenia, unspecified: Secondary | ICD-10-CM | POA: Diagnosis not present

## 2020-08-11 DIAGNOSIS — C9200 Acute myeloblastic leukemia, not having achieved remission: Secondary | ICD-10-CM | POA: Diagnosis not present

## 2020-08-11 DIAGNOSIS — K625 Hemorrhage of anus and rectum: Secondary | ICD-10-CM | POA: Diagnosis not present

## 2020-08-11 DIAGNOSIS — C9201 Acute myeloblastic leukemia, in remission: Secondary | ICD-10-CM

## 2020-08-11 DIAGNOSIS — D696 Thrombocytopenia, unspecified: Secondary | ICD-10-CM | POA: Diagnosis not present

## 2020-08-11 DIAGNOSIS — Z5189 Encounter for other specified aftercare: Secondary | ICD-10-CM | POA: Diagnosis not present

## 2020-08-11 LAB — CBC WITH DIFFERENTIAL/PLATELET
Abs Immature Granulocytes: 0.76 10*3/uL — ABNORMAL HIGH (ref 0.00–0.07)
Basophils Absolute: 0.1 10*3/uL (ref 0.0–0.1)
Basophils Relative: 1 %
Eosinophils Absolute: 0 10*3/uL (ref 0.0–0.5)
Eosinophils Relative: 0 %
HCT: 30.5 % — ABNORMAL LOW (ref 39.0–52.0)
Hemoglobin: 9.8 g/dL — ABNORMAL LOW (ref 13.0–17.0)
Immature Granulocytes: 12 %
Lymphocytes Relative: 7 %
Lymphs Abs: 0.5 10*3/uL — ABNORMAL LOW (ref 0.7–4.0)
MCH: 28.7 pg (ref 26.0–34.0)
MCHC: 32.1 g/dL (ref 30.0–36.0)
MCV: 89.2 fL (ref 80.0–100.0)
Monocytes Absolute: 1 10*3/uL (ref 0.1–1.0)
Monocytes Relative: 15 %
Neutro Abs: 4.3 10*3/uL (ref 1.7–7.7)
Neutrophils Relative %: 65 %
Platelets: 68 10*3/uL — ABNORMAL LOW (ref 150–400)
RBC: 3.42 MIL/uL — ABNORMAL LOW (ref 4.22–5.81)
RDW: 15 % (ref 11.5–15.5)
WBC: 6.6 10*3/uL (ref 4.0–10.5)
nRBC: 0 % (ref 0.0–0.2)

## 2020-08-11 LAB — SAMPLE TO BLOOD BANK

## 2020-08-12 ENCOUNTER — Inpatient Hospital Stay: Payer: BC Managed Care – PPO

## 2020-08-14 NOTE — Progress Notes (Signed)
[facility]  [address], [address] [place], [place] [address] Phone: [phone]  Fax: [phone]   Clinic Day:  08/15/2020  Referring physician: Verl Bangs, FNP  Chief Complaint: Carl Vowels. is a 44 y.o. male with AML who is seen for assessment on day 22 s/p cycle #1 HiDAC chemotherapy.  HPI: The patient was last seen in the medical oncology clinic on 08/08/2020. At that time, he felt "ok".  He noted intermittent rectal bleeding.  Hematocrit was 30.9, hemoglobin 10.1, MCV 89.0, platelets 18,000, WBC 3,300 (ANC 2,000).  CBC followed: 08/09/2020: Hematocrit 31.0, hemoglobin 10.1, MCV 89.6, platelets 21,000, WBC 6,400. 08/10/2020: Hematocrit 29.5, hemoglobin   9.6, MCV 89.7, platelets 38,000, WBC 9,300. 08/11/2020: Hematocrit 30.5, hemoglobin   9.8, MCV 89.2, platelets 68,000, WBC 6,600.  During the interim, he has been "good." He has some left side pain, which he thinks is due to the way he sits when he watches TV.  The patient denies bleeding of any kind. The spots on his arms from blood pressure cuffs have resolved. His rectal bleeding has resolved. He checks his temperature at home and has not had any fevers.  He is still undecided about a transplant.   Past Medical History:  Diagnosis Date  . Acute myeloid leukemia in remission ([place])   . Agranulocytosis secondary to cancer chemotherapy (CODE) ([place])   . Chemotherapy induced neutropenia ([place])   . Pancreatitis   . Pancytopenia ([place])   . Thrombocytopenia ([place])   . Tobacco use disorder     History reviewed. No pertinent surgical history.  Family History  Problem Relation Age of Onset  . Prostate cancer Neg Hx   . Kidney cancer Neg Hx   . Bladder Cancer Neg Hx     Social History:  reports that he quit smoking about 4 months ago. His smoking use included cigarettes. He has a 17.00 pack-year smoking history. He has never used smokeless tobacco. He reports previous alcohol use of  about 6.0 standard drinks of alcohol per week. He reports that he does not use drugs.The patient is alone today.  Allergies:  Allergies  Allergen Reactions  . Dapsone     Contraindication - G6PD deficient  . Primaquine     Contraindication - G6PD deficient  . Rasburicase     Contraindication - G6PD deficient    Current Medications: Current Outpatient Medications  Medication Sig Dispense Refill  . Multiple Vitamin (MULTIVITAMIN WITH MINERALS) TABS tablet Take 1 tablet by mouth daily.    Marland Kitchen thiamine 100 MG tablet Take 1 tablet (100 mg total) by mouth daily.    . valACYclovir (VALTREX) 500 MG tablet Take 500 mg by mouth daily.    . folic acid (FOLVITE) 1 MG tablet Take 1 tablet (1 mg total) by mouth daily. (Patient not taking: Reported on 08/15/2020)    . HYDROcodone-acetaminophen (NORCO) 5-325 MG tablet Take 1 tablet by mouth every 6 (six) hours as needed for moderate pain. (Patient not taking: Reported on 08/15/2020) 12 tablet 0  . levofloxacin (LEVAQUIN) 500 MG tablet Take by mouth. (Patient not taking: Reported on 08/15/2020)    . magnesium oxide (MAG-OX) 400 MG tablet Take 800 mg by mouth daily. (Patient not taking: No sig reported)    . prochlorperazine (COMPAZINE) 10 MG tablet Take by mouth. (Patient not taking: Reported on 08/15/2020)     No current facility-administered medications for this visit.    Review of Systems  Constitutional: Negative for chills, diaphoresis, fever,  malaise/fatigue and weight loss (stable).       Feels "good."  HENT: Negative for congestion, ear discharge, ear pain, hearing loss, nosebleeds, sinus pain, sore throat and tinnitus.   Eyes: Negative for blurred vision.  Respiratory: Negative for cough, hemoptysis, sputum production and shortness of breath.   Cardiovascular: Negative for chest pain, palpitations and leg swelling.  Gastrointestinal: Negative for abdominal pain, blood in stool, constipation, diarrhea, heartburn, melena, nausea and vomiting.   Genitourinary: Negative for dysuria, frequency, hematuria and urgency.  Musculoskeletal: Negative for back pain, joint pain, myalgias and neck pain.       Left side pain.  Skin: Negative for itching and rash.  Neurological: Negative for dizziness, tingling, sensory change, weakness and headaches.  Endo/Heme/Allergies: Does not bruise/bleed easily.  Psychiatric/Behavioral: Negative for depression and memory loss. The patient is not nervous/anxious and does not have insomnia.   All other systems reviewed and are negative.  Performance status (ECOG): 0  Vitals Blood pressure 114/74, pulse 96, temperature 97.7 F (36.5 C), temperature source Tympanic, resp. rate 18, height 5\' 9"  (1.753 m), weight 189 lb 4.2 oz (85.9 kg), SpO2 100 %.   Physical Exam Vitals and nursing note reviewed.  Constitutional:      General: He is not in acute distress.    Appearance: He is not diaphoretic.  HENT:     Head: Normocephalic and atraumatic.     Mouth/Throat:     Mouth: Mucous membranes are moist.     Pharynx: Oropharynx is clear.  Eyes:     General: No scleral icterus.    Extraocular Movements: Extraocular movements intact.     Conjunctiva/sclera: Conjunctivae normal.     Pupils: Pupils are equal, round, and reactive to light.  Cardiovascular:     Rate and Rhythm: Normal rate and regular rhythm.     Heart sounds: Normal heart sounds. No murmur heard.   Pulmonary:     Effort: Pulmonary effort is normal. No respiratory distress.     Breath sounds: Normal breath sounds. No wheezing or rales.  Chest:     Chest wall: No tenderness.  Breasts:     Right: No axillary adenopathy or supraclavicular adenopathy.     Left: No axillary adenopathy or supraclavicular adenopathy.      Comments: Right chest port-a-cath unremarkable. Abdominal:     General: Bowel sounds are normal. There is no distension.     Palpations: Abdomen is soft. There is no hepatomegaly, splenomegaly or mass.     Tenderness: There  is no abdominal tenderness. There is no guarding or rebound.  Musculoskeletal:        General: No swelling or tenderness. Normal range of motion.     Cervical back: Normal range of motion and neck supple.     Right lower leg: No edema.     Left lower leg: No edema.  Lymphadenopathy:     Head:     Right side of head: No preauricular, posterior auricular or occipital adenopathy.     Left side of head: No preauricular, posterior auricular or occipital adenopathy.     Cervical: No cervical adenopathy.     Upper Body:     Right upper body: No supraclavicular or axillary adenopathy.     Left upper body: No supraclavicular or axillary adenopathy.     Lower Body: No right inguinal adenopathy. No left inguinal adenopathy.  Skin:    General: Skin is warm and dry.     Findings: No rash.  Neurological:  Mental Status: He is alert and oriented to person, place, and time.  Psychiatric:        Behavior: Behavior normal.        Thought Content: Thought content normal.        Judgment: Judgment normal.    Appointment on 08/15/2020  Component Date Value Ref Range Status  . WBC 08/15/2020 4.9  4.0 - 10.5 K/uL Final  . RBC 08/15/2020 3.37* 4.22 - 5.81 MIL/uL Final  . Hemoglobin 08/15/2020 9.8* 13.0 - 17.0 g/dL Final  . HCT 08/15/2020 30.3* 39.0 - 52.0 % Final  . MCV 08/15/2020 89.9  80.0 - 100.0 fL Final  . MCH 08/15/2020 29.1  26.0 - 34.0 pg Final  . MCHC 08/15/2020 32.3  30.0 - 36.0 g/dL Final  . RDW 08/15/2020 15.1  11.5 - 15.5 % Final  . Platelets 08/15/2020 247  150 - 400 K/uL Final  . nRBC 08/15/2020 1.2* 0.0 - 0.2 % Final  . Neutrophils Relative % 08/15/2020 68  % Final  . Neutro Abs 08/15/2020 3.4  1.7 - 7.7 K/uL Final  . Lymphocytes Relative 08/15/2020 12  % Final  . Lymphs Abs 08/15/2020 0.6* 0.7 - 4.0 K/uL Final  . Monocytes Relative 08/15/2020 17  % Final  . Monocytes Absolute 08/15/2020 0.8  0.1 - 1.0 K/uL Final  . Eosinophils Relative 08/15/2020 0  % Final  . Eosinophils  Absolute 08/15/2020 0.0  0.0 - 0.5 K/uL Final  . Basophils Relative 08/15/2020 0  % Final  . Basophils Absolute 08/15/2020 0.0  0.0 - 0.1 K/uL Final  . Immature Granulocytes 08/15/2020 3  % Final  . Abs Immature Granulocytes 08/15/2020 0.14* 0.00 - 0.07 K/uL Final   Performed at [facility], [address], [place], [place] [address]    Assessment:  Carl Joseguadalupe Stan. 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  He has received 2 courses of induction chemotherapy with daunorubicin and cytarabine.  Bone marrow on 04/01/2020 revealed AML.  Bone marrow on 04/26/2020 was hypocellular (5-10%) with 5% blasts.  Bone marrow on 05/24/2020 revealed 30% cellularity with 10% blasts and focal clustering up to 30-40%.  Bone marrow on 07/05/2020 revealed 50% cellularity with 2% blasts.  MRD was negative by flow cytometry.  He received cytarabine and daunorubicin (7+3) beginning 04/13/2020 and 05/27/2020.  Course has been complicated by fever and neutropenia with mucositis (04/27/2020) and gluteal cleft cellulitis (05/06/2020).  He is day 22 of cycle #1 HiDAC consolidation with Udenyca support (began 07/25/2020).  He has required 2 platelet transfusions (08/04/2020 and 08/05/2020).  He has G6PD deficiency.  G6PD was 1.0 (5.3-10.3) on 04/02/2020.  He received the first COVID-19 vaccine on 07/27/2020.  Symptomatically, he feels "good."  He has some left side pain, which he thinks is due to the way he sits when he watches TV. He denies bleeding.  Exam reveals no adenopathy or hepatosplenomegaly.  Plan: 1.   Labs today: CBC with diff 2.   Acute myelogenous leukemia  He is s/p 2 courses of induction daunorubicin and cytarabine.  He is day 22 of cycle #1 high-dose ara-C (07/25/2020) with Udencya support.   Counts have recovered.  We discussed plans for cycle #2 HiDAC.  He remains unsure about transplant.  Discuss symptom management.  He has antiemetics at home  to use on a prn bases.  Interventions are adequate.     3.   Thrombocytopenia  Platelets 247,000.  He denies any  bruising or bleeding.   Upper extremity petechiae associated with BP cuff have resolved.   He has had no further rectal bleeding.. 4.   Neutropenia  WBC 4900 with an ANC of 3400.  Patient on prophylactic antibiotics. 5.   Cancel upcoming appointments. 6.   Patient to receive 2nd cycle of HiDAC in the next week or two.   7.   Patient to call regarding anticipated 3 day inpatient admission at [facility] with plan for Udencya in [facility] or [place] to follow.   I discussed the assessment and treatment plan with the patient.  The patient was provided an opportunity to ask questions and all were answered.  The patient agreed with the plan and demonstrated an understanding of the instructions.  The patient was advised to call back if the symptoms worsen or if the condition fails to improve as anticipated.  I provided 17 minutes of face-to-face time during this this encounter and > 50% was spent counseling as documented under my assessment and plan.  An additional 5 minutes were spent reviewing his chart (Epic and Care Everywhere) including notes, labs, and imaging studies.    Enriqueta Augusta C. Mike Gip, MD, PhD    08/15/2020, 3:15 PM  I, Mirian Mo Tufford, am acting as Education administrator for [employer]. Mike Gip, MD, PhD.  I, Jessee Newnam C. Mike Gip, MD, have reviewed the above documentation for accuracy and completeness, and I agree with the above.

## 2020-08-15 ENCOUNTER — Encounter: Payer: Self-pay | Admitting: Hematology and Oncology

## 2020-08-15 ENCOUNTER — Inpatient Hospital Stay (HOSPITAL_BASED_OUTPATIENT_CLINIC_OR_DEPARTMENT_OTHER): Payer: BC Managed Care – PPO | Admitting: Hematology and Oncology

## 2020-08-15 ENCOUNTER — Other Ambulatory Visit: Payer: Self-pay

## 2020-08-15 ENCOUNTER — Inpatient Hospital Stay: Payer: BC Managed Care – PPO

## 2020-08-15 VITALS — BP 114/74 | HR 96 | Temp 97.7°F | Resp 18 | Ht 69.0 in | Wt 189.3 lb

## 2020-08-15 DIAGNOSIS — C9201 Acute myeloblastic leukemia, in remission: Secondary | ICD-10-CM

## 2020-08-15 DIAGNOSIS — C9200 Acute myeloblastic leukemia, not having achieved remission: Secondary | ICD-10-CM | POA: Diagnosis not present

## 2020-08-15 DIAGNOSIS — D709 Neutropenia, unspecified: Secondary | ICD-10-CM | POA: Diagnosis not present

## 2020-08-15 DIAGNOSIS — K625 Hemorrhage of anus and rectum: Secondary | ICD-10-CM | POA: Diagnosis not present

## 2020-08-15 DIAGNOSIS — Z5189 Encounter for other specified aftercare: Secondary | ICD-10-CM | POA: Diagnosis not present

## 2020-08-15 DIAGNOSIS — D696 Thrombocytopenia, unspecified: Secondary | ICD-10-CM | POA: Diagnosis not present

## 2020-08-15 LAB — CBC WITH DIFFERENTIAL/PLATELET
Abs Immature Granulocytes: 0.14 10*3/uL — ABNORMAL HIGH (ref 0.00–0.07)
Basophils Absolute: 0 10*3/uL (ref 0.0–0.1)
Basophils Relative: 0 %
Eosinophils Absolute: 0 10*3/uL (ref 0.0–0.5)
Eosinophils Relative: 0 %
HCT: 30.3 % — ABNORMAL LOW (ref 39.0–52.0)
Hemoglobin: 9.8 g/dL — ABNORMAL LOW (ref 13.0–17.0)
Immature Granulocytes: 3 %
Lymphocytes Relative: 12 %
Lymphs Abs: 0.6 10*3/uL — ABNORMAL LOW (ref 0.7–4.0)
MCH: 29.1 pg (ref 26.0–34.0)
MCHC: 32.3 g/dL (ref 30.0–36.0)
MCV: 89.9 fL (ref 80.0–100.0)
Monocytes Absolute: 0.8 10*3/uL (ref 0.1–1.0)
Monocytes Relative: 17 %
Neutro Abs: 3.4 10*3/uL (ref 1.7–7.7)
Neutrophils Relative %: 68 %
Platelets: 247 10*3/uL (ref 150–400)
RBC: 3.37 MIL/uL — ABNORMAL LOW (ref 4.22–5.81)
RDW: 15.1 % (ref 11.5–15.5)
WBC: 4.9 10*3/uL (ref 4.0–10.5)
nRBC: 1.2 % — ABNORMAL HIGH (ref 0.0–0.2)

## 2020-08-15 NOTE — Progress Notes (Signed)
No new changes noted today 

## 2020-08-17 DIAGNOSIS — C9200 Acute myeloblastic leukemia, not having achieved remission: Secondary | ICD-10-CM | POA: Diagnosis not present

## 2020-08-18 ENCOUNTER — Inpatient Hospital Stay: Payer: BC Managed Care – PPO

## 2020-08-18 LAB — SAMPLE TO BLOOD BANK

## 2020-08-22 ENCOUNTER — Inpatient Hospital Stay: Payer: BC Managed Care – PPO

## 2020-08-25 ENCOUNTER — Inpatient Hospital Stay: Payer: BC Managed Care – PPO

## 2020-08-25 DIAGNOSIS — D849 Immunodeficiency, unspecified: Secondary | ICD-10-CM | POA: Diagnosis not present

## 2020-08-25 DIAGNOSIS — D61810 Antineoplastic chemotherapy induced pancytopenia: Secondary | ICD-10-CM | POA: Diagnosis not present

## 2020-08-25 DIAGNOSIS — K649 Unspecified hemorrhoids: Secondary | ICD-10-CM | POA: Diagnosis not present

## 2020-08-25 DIAGNOSIS — Z5111 Encounter for antineoplastic chemotherapy: Secondary | ICD-10-CM | POA: Diagnosis not present

## 2020-08-25 DIAGNOSIS — K029 Dental caries, unspecified: Secondary | ICD-10-CM | POA: Diagnosis not present

## 2020-08-25 DIAGNOSIS — F1021 Alcohol dependence, in remission: Secondary | ICD-10-CM | POA: Diagnosis not present

## 2020-08-25 DIAGNOSIS — F1011 Alcohol abuse, in remission: Secondary | ICD-10-CM | POA: Diagnosis not present

## 2020-08-25 DIAGNOSIS — C9201 Acute myeloblastic leukemia, in remission: Secondary | ICD-10-CM | POA: Diagnosis not present

## 2020-08-25 DIAGNOSIS — Z87891 Personal history of nicotine dependence: Secondary | ICD-10-CM | POA: Diagnosis not present

## 2020-08-25 DIAGNOSIS — D75A Glucose-6-phosphate dehydrogenase (G6PD) deficiency without anemia: Secondary | ICD-10-CM | POA: Diagnosis not present

## 2020-08-25 DIAGNOSIS — Z20822 Contact with and (suspected) exposure to covid-19: Secondary | ICD-10-CM | POA: Diagnosis not present

## 2020-08-29 NOTE — Progress Notes (Signed)
[facility]  [address] [place], [place] [address] Phone: [phone]  Fax: [phone]   Clinic Day:  08/30/2020  Referring physician: Lequita Asal, MD  Chief Complaint: Carl Garlow. is a 45 y.o. male with AML who is seen for assessment on day 5 s/p cycle #2 HiDAC.  HPI: The patient was last seen in the medical oncology clinic on 08/15/2020. At that time, he felt "good."  He had some left side pain, which he thought was due to the way he sat when he watched TV. He denied bleeding.  Exam revealed no adenopathy or hepatosplenomegaly. Hematocrit was 30.3, hemoglobin 9.8, MCV 89.9, platelets 247,000, WBC 4,900.  The patient saw Dr. Novella Rob on 08/17/2020.  LFTs were elevated (AST 51, ALT 141, alkaline phosphatase 163 and bilirubin 0.3) possibly related to fluconazole.  Fluconazole was temporary discontinued.  He was to continue levofloxacin, fluconazole and valacyclovir following discharge from the hospital after cycle #2 consolidation through day 21.  He has met with Dr. Theodoro Clock regarding treatment plan and reported they were working on getting swabs from his siblings.  He was still not sure about possible transplant.  The patient was admitted to [facility] from 08/25/2020 - 08/27/2020. for cycle #2 HiDAC.  He tolerated chemotherapy well.  Labs on admission included an AST of 41, ALT 96, alkaline phosphatase 121. On the day of discharge, AST was 27, ALT 78, alkaline phosphatase 106 and bilirubin 0.6.  He was advised to have dental extraction of #5, #17 due to risk of infection.  He has a follow-up with dentistry on 09/13/2019. CBC on 08/28/2020 included a hematocrit of 33.0, hemoglobin 10.4, platelets 289,000, WBC 4800 with an [place] 4600.  During the interim, he has been "alright." This cycle of treatment went the same as the first cycle. He has a headache right now due to stress regarding his insurance. His port is a little bit sore. The patient denies  chest pain, palpitations, rashes, urinary symptoms, bleeding of any kind, nausea, and vomiting.  He has not decided about a bone marrow transplant yet. He is seeing Dr. Ernst Bowler again on 09/19/2020.    Past Medical History:  Diagnosis Date  . Acute myeloid leukemia in remission ([place])   . Agranulocytosis secondary to cancer chemotherapy (CODE) ([place])   . Chemotherapy induced neutropenia ([place])   . Pancreatitis   . Pancytopenia ([place])   . Thrombocytopenia ([place])   . Tobacco use disorder     History reviewed. No pertinent surgical history.  Family History  Problem Relation Age of Onset  . Prostate cancer Neg Hx   . Kidney cancer Neg Hx   . Bladder Cancer Neg Hx     Social History:  reports that he quit smoking about 5 months ago. His smoking use included cigarettes. He has a 17.00 pack-year smoking history. He has never used smokeless tobacco. He reports previous alcohol use of about 6.0 standard drinks of alcohol per week. He reports that he does not use drugs.The patient is alone today.  Allergies:  Allergies  Allergen Reactions  . Dapsone     Contraindication - G6PD deficient  . Primaquine     Contraindication - G6PD deficient  . Rasburicase     Contraindication - G6PD deficient    Current Medications: Current Outpatient Medications  Medication Sig Dispense Refill  . Multiple Vitamin (MULTIVITAMIN WITH MINERALS) TABS tablet Take 1 tablet by mouth daily.    . prednisoLONE acetate (PRED FORTE) 1 % ophthalmic  suspension Administer 2 drops to both eyes every six (6) hours for 7 days.    Marland Kitchen thiamine 100 MG tablet Take 1 tablet (100 mg total) by mouth daily.    . valACYclovir (VALTREX) 500 MG tablet Take 500 mg by mouth daily.    . folic acid (FOLVITE) 1 MG tablet Take 1 tablet (1 mg total) by mouth daily. (Patient not taking: No sig reported)    . HYDROcodone-acetaminophen (NORCO) 5-325 MG tablet Take 1 tablet by mouth every 6 (six) hours as needed for moderate pain. (Patient not  taking: No sig reported) 12 tablet 0  . levofloxacin (LEVAQUIN) 500 MG tablet Take by mouth. (Patient not taking: No sig reported)    . magnesium oxide (MAG-OX) 400 MG tablet Take 800 mg by mouth daily. (Patient not taking: No sig reported)    . prochlorperazine (COMPAZINE) 10 MG tablet Take by mouth. (Patient not taking: No sig reported)     No current facility-administered medications for this visit.    Review of Systems  Constitutional: Negative for chills, diaphoresis, fever, malaise/fatigue and weight loss (no new weight).  HENT: Negative for congestion, ear discharge, ear pain, hearing loss, nosebleeds, sinus pain, sore throat and tinnitus.   Eyes: Negative for blurred vision.  Respiratory: Negative for cough, hemoptysis, sputum production and shortness of breath.   Cardiovascular: Negative for chest pain, palpitations and leg swelling.  Gastrointestinal: Negative for abdominal pain, blood in stool, constipation, diarrhea, heartburn, melena, nausea and vomiting.  Genitourinary: Negative for dysuria, frequency, hematuria and urgency.  Musculoskeletal: Negative for back pain, joint pain, myalgias and neck pain.       Soreness at port site.  Skin: Negative for itching and rash.  Neurological: Positive for headaches (due to stress). Negative for dizziness, tingling, sensory change and weakness.  Endo/Heme/Allergies: Does not bruise/bleed easily.  Psychiatric/Behavioral: Negative for depression and memory loss. The patient is not nervous/anxious and does not have insomnia.        Stressed.  All other systems reviewed and are negative.  Performance status (ECOG): 0  Vitals There were no vitals taken for this visit.   Physical Exam Vitals and nursing note reviewed.  Constitutional:      General: He is not in acute distress.    Appearance: He is not diaphoretic.  HENT:     Head: Normocephalic and atraumatic.     Mouth/Throat:     Mouth: Mucous membranes are moist.     Pharynx:  Oropharynx is clear.  Eyes:     General: No scleral icterus.    Extraocular Movements: Extraocular movements intact.     Conjunctiva/sclera: Conjunctivae normal.     Pupils: Pupils are equal, round, and reactive to light.  Cardiovascular:     Rate and Rhythm: Normal rate and regular rhythm.     Heart sounds: Normal heart sounds. No murmur heard.   Pulmonary:     Effort: Pulmonary effort is normal. No respiratory distress.     Breath sounds: Normal breath sounds. No wheezing or rales.  Chest:     Chest wall: No tenderness.  Breasts:     Right: No axillary adenopathy or supraclavicular adenopathy.     Left: No axillary adenopathy or supraclavicular adenopathy.      Comments: Port-a-cath site unremarkable. Abdominal:     General: Bowel sounds are normal. There is no distension.     Palpations: Abdomen is soft. There is no mass.     Tenderness: There is no abdominal tenderness. There  is no guarding or rebound.  Musculoskeletal:        General: No swelling or tenderness. Normal range of motion.     Cervical back: Normal range of motion and neck supple.  Lymphadenopathy:     Head:     Right side of head: No preauricular, posterior auricular or occipital adenopathy.     Left side of head: No preauricular, posterior auricular or occipital adenopathy.     Cervical: No cervical adenopathy.     Upper Body:     Right upper body: No supraclavicular or axillary adenopathy.     Left upper body: No supraclavicular or axillary adenopathy.     Lower Body: No right inguinal adenopathy. No left inguinal adenopathy.  Skin:    General: Skin is warm and dry.  Neurological:     Mental Status: He is alert and oriented to person, place, and time.  Psychiatric:        Behavior: Behavior normal.        Thought Content: Thought content normal.        Judgment: Judgment normal.    Office Visit on 08/30/2020  Component Date Value Ref Range Status  . WBC 08/30/2020 2.4* 4.0 - 10.5 K/uL Final  . RBC  08/30/2020 3.58* 4.22 - 5.81 MIL/uL Final  . Hemoglobin 08/30/2020 10.5* 13.0 - 17.0 g/dL Final  . HCT 08/30/2020 33.1* 39.0 - 52.0 % Final  . MCV 08/30/2020 92.5  80.0 - 100.0 fL Final  . MCH 08/30/2020 29.3  26.0 - 34.0 pg Final  . MCHC 08/30/2020 31.7  30.0 - 36.0 g/dL Final  . RDW 08/30/2020 17.4* 11.5 - 15.5 % Final  . Platelets 08/30/2020 201  150 - 400 K/uL Final  . nRBC 08/30/2020 0.0  0.0 - 0.2 % Final  . Neutrophils Relative % 08/30/2020 96  % Final  . Neutro Abs 08/30/2020 2.3  1.7 - 7.7 K/uL Final  . Lymphocytes Relative 08/30/2020 3  % Final  . Lymphs Abs 08/30/2020 0.1* 0.7 - 4.0 K/uL Final  . Monocytes Relative 08/30/2020 0  % Final  . Monocytes Absolute 08/30/2020 0.0* 0.1 - 1.0 K/uL Final  . Eosinophils Relative 08/30/2020 0  % Final  . Eosinophils Absolute 08/30/2020 0.0  0.0 - 0.5 K/uL Final  . Basophils Relative 08/30/2020 0  % Final  . Basophils Absolute 08/30/2020 0.0  0.0 - 0.1 K/uL Final  . Immature Granulocytes 08/30/2020 1  % Final  . Abs Immature Granulocytes 08/30/2020 0.02  0.00 - 0.07 K/uL Final   Performed at [facility], [address], [place], [place] [address]    Assessment:  Carl Lonnel Gjerde. 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  He has received 2 courses of induction chemotherapy with daunorubicin and cytarabine.  Bone marrow on 04/01/2020 revealed AML.  Bone marrow on 04/26/2020 was hypocellular (5-10%) with 5% blasts.  Bone marrow on 05/24/2020 revealed 30% cellularity with 10% blasts and focal clustering up to 30-40%.  Bone marrow on 07/05/2020 revealed 50% cellularity with 2% blasts.  MRD was negative by flow cytometry.  He received cytarabine and daunorubicin (7+3) beginning 04/13/2020 and 05/27/2020.  Course has been complicated by fever and neutropenia with mucositis (04/27/2020) and gluteal cleft cellulitis (05/06/2020).  He is day 5 s/p cycle #2 HiDAC consolidation with Udenyca support  (began 07/25/2020 - 08/25/2020).  He has required 2 platelet transfusions (08/04/2020 and 08/05/2020) with cycle #1.  He has G6PD deficiency.  G6PD was 1.0 (5.3-10.3) on 04/02/2020.  He received the first COVID-19 vaccine on 07/27/2020.  Symptomatically, he feels "alright".  He denies any bleeding.  He is stressed about insurance issues.  Plan: 1.   Labs:  CBC with diff. 2.   Acute myelogenous leukemia  He is s/p 2 courses of induction daunorubicin and cytarabine.  He is day 5 s/p cycle #2 high-dose ara-C (08/25/2020).   Discuss cycle #2 HiDAC.   He tolerated treatment well.  Discuss plans to monitor hematologic effects of therapy 2-3 times weekly s/p chemotherapy.  Blood products are leukoreduced and irradiated.  Transfusion requirement:    2 units PRBCs for hemoglobin < 8   1 unit platelets for platelet count < 10,000, bleeding or invasive procedure  With cycle #1, he required platelet transfusion support (platelets) on day 11 and 12.   Platelet count was 7,000 on 08/04/2020.    He had associated rectal bleeding.   ANC nadir was 0 on 08/05/2020 (day 12).   Hemoglobin nadir was 9.6 on 08/10/2020 (day 17).  He is on prophylactic antibiotics.   Levaquin, fluconazole, valacyclovir.  He remains unsure about bone marrow transplant.   Family members are still being tested.  He will be seen at [place] clinic in follow-up in approximately 5 weeks .  Discuss symptom management.  He has antiemetics at home to use on a prn bases.  Interventions are adequate.    3.   Leukopenia  WBC 2400 with an ANC of 2300.  Patient on prophylactic antibiotics.  Plan for growth factor support [place]) today. 4.   Insurance change  Patient changed insurance from [employer] to [place] on 08/27/2020.  His is unable to receive Udencya today until prior approval. 5.   RTC tomorrow for Udencya (afternoon)- will call with insurance approval. 6.   RTC on 09/02/2020 in [place] for labs (CBC with diff, hold  tube). 7.   RTC on 09/06/2019 in [place] for labs (CBC with diff, hold tube) and +/- platelet transfusion. 8.   RTC on 01/12 and 01/14 for labs (CBC with diff). 9.   RTC on 01/17 for MD assessment and labs (CBC with diff, hold tube).  I discussed the assessment and treatment plan with the patient.  The patient was provided an opportunity to ask questions and all were answered.  The patient agreed with the plan and demonstrated an understanding of the instructions.  The patient was advised to call back if the symptoms worsen or if the condition fails to improve as anticipated.  I provided 28 minutes of face-to-face time during this this encounter and > 50% was spent counseling as documented under my assessment and plan.  An additional 10 minutes were spent reviewing his chart (Epic and Care Everywhere) including notes, labs, and imaging studies.    Carl [name] C. Mike Gip, MD, PhD 08/30/2020, 5:30 PM  I, Lequita Asal, am acting as Education administrator for [employer]. Mike Gip, MD, PhD.  I, Caedin Mogan C. Mike Gip, MD, have reviewed the above documentation for accuracy and completeness, and I agree with the above.

## 2020-08-30 ENCOUNTER — Encounter: Payer: Self-pay | Admitting: Hematology and Oncology

## 2020-08-30 ENCOUNTER — Other Ambulatory Visit: Payer: Self-pay

## 2020-08-30 ENCOUNTER — Inpatient Hospital Stay (HOSPITAL_BASED_OUTPATIENT_CLINIC_OR_DEPARTMENT_OTHER): Payer: No Typology Code available for payment source | Admitting: Hematology and Oncology

## 2020-08-30 ENCOUNTER — Ambulatory Visit: Payer: BC Managed Care – PPO

## 2020-08-30 ENCOUNTER — Inpatient Hospital Stay: Payer: No Typology Code available for payment source | Attending: Hematology and Oncology

## 2020-08-30 DIAGNOSIS — C9200 Acute myeloblastic leukemia, not having achieved remission: Secondary | ICD-10-CM | POA: Diagnosis present

## 2020-08-30 DIAGNOSIS — C9201 Acute myeloblastic leukemia, in remission: Secondary | ICD-10-CM | POA: Diagnosis not present

## 2020-08-30 DIAGNOSIS — Z5189 Encounter for other specified aftercare: Secondary | ICD-10-CM | POA: Insufficient documentation

## 2020-08-30 LAB — CBC WITH DIFFERENTIAL/PLATELET
Abs Immature Granulocytes: 0.02 10*3/uL (ref 0.00–0.07)
Basophils Absolute: 0 10*3/uL (ref 0.0–0.1)
Basophils Relative: 0 %
Eosinophils Absolute: 0 10*3/uL (ref 0.0–0.5)
Eosinophils Relative: 0 %
HCT: 33.1 % — ABNORMAL LOW (ref 39.0–52.0)
Hemoglobin: 10.5 g/dL — ABNORMAL LOW (ref 13.0–17.0)
Immature Granulocytes: 1 %
Lymphocytes Relative: 3 %
Lymphs Abs: 0.1 10*3/uL — ABNORMAL LOW (ref 0.7–4.0)
MCH: 29.3 pg (ref 26.0–34.0)
MCHC: 31.7 g/dL (ref 30.0–36.0)
MCV: 92.5 fL (ref 80.0–100.0)
Monocytes Absolute: 0 10*3/uL — ABNORMAL LOW (ref 0.1–1.0)
Monocytes Relative: 0 %
Neutro Abs: 2.3 10*3/uL (ref 1.7–7.7)
Neutrophils Relative %: 96 %
Platelets: 201 10*3/uL (ref 150–400)
RBC: 3.58 MIL/uL — ABNORMAL LOW (ref 4.22–5.81)
RDW: 17.4 % — ABNORMAL HIGH (ref 11.5–15.5)
WBC: 2.4 10*3/uL — ABNORMAL LOW (ref 4.0–10.5)
nRBC: 0 % (ref 0.0–0.2)

## 2020-08-31 ENCOUNTER — Inpatient Hospital Stay: Payer: No Typology Code available for payment source

## 2020-08-31 ENCOUNTER — Other Ambulatory Visit: Payer: Self-pay | Admitting: Hematology and Oncology

## 2020-08-31 DIAGNOSIS — C9201 Acute myeloblastic leukemia, in remission: Secondary | ICD-10-CM

## 2020-08-31 DIAGNOSIS — C9200 Acute myeloblastic leukemia, not having achieved remission: Secondary | ICD-10-CM | POA: Diagnosis not present

## 2020-08-31 MED ORDER — PEGFILGRASTIM INJECTION 6 MG/0.6ML ~~LOC~~
6.0000 mg | PREFILLED_SYRINGE | Freq: Once | SUBCUTANEOUS | Status: AC
Start: 2020-08-31 — End: 2020-08-31
  Administered 2020-08-31: 6 mg via SUBCUTANEOUS
  Filled 2020-08-31: qty 0.6

## 2020-09-02 ENCOUNTER — Inpatient Hospital Stay: Payer: No Typology Code available for payment source

## 2020-09-02 DIAGNOSIS — C9200 Acute myeloblastic leukemia, not having achieved remission: Secondary | ICD-10-CM | POA: Diagnosis not present

## 2020-09-02 DIAGNOSIS — C9201 Acute myeloblastic leukemia, in remission: Secondary | ICD-10-CM

## 2020-09-02 LAB — CBC WITH DIFFERENTIAL/PLATELET
Abs Immature Granulocytes: 0.03 10*3/uL (ref 0.00–0.07)
Basophils Absolute: 0 10*3/uL (ref 0.0–0.1)
Basophils Relative: 0 %
Eosinophils Absolute: 0 10*3/uL (ref 0.0–0.5)
Eosinophils Relative: 0 %
HCT: 29.7 % — ABNORMAL LOW (ref 39.0–52.0)
Hemoglobin: 9.9 g/dL — ABNORMAL LOW (ref 13.0–17.0)
Immature Granulocytes: 13 %
Lymphocytes Relative: 35 %
Lymphs Abs: 0.1 10*3/uL — ABNORMAL LOW (ref 0.7–4.0)
MCH: 30.4 pg (ref 26.0–34.0)
MCHC: 33.3 g/dL (ref 30.0–36.0)
MCV: 91.1 fL (ref 80.0–100.0)
Monocytes Absolute: 0 10*3/uL — ABNORMAL LOW (ref 0.1–1.0)
Monocytes Relative: 4 %
Neutro Abs: 0.1 10*3/uL — CL (ref 1.7–7.7)
Neutrophils Relative %: 48 %
Platelets: 78 10*3/uL — ABNORMAL LOW (ref 150–400)
RBC: 3.26 MIL/uL — ABNORMAL LOW (ref 4.22–5.81)
RDW: 16.5 % — ABNORMAL HIGH (ref 11.5–15.5)
Smear Review: NORMAL
WBC: 0.2 10*3/uL — CL (ref 4.0–10.5)
nRBC: 0 % (ref 0.0–0.2)

## 2020-09-02 LAB — SAMPLE TO BLOOD BANK

## 2020-09-05 ENCOUNTER — Other Ambulatory Visit: Payer: Self-pay | Admitting: Hematology and Oncology

## 2020-09-05 ENCOUNTER — Inpatient Hospital Stay: Payer: No Typology Code available for payment source

## 2020-09-05 ENCOUNTER — Other Ambulatory Visit: Payer: Self-pay

## 2020-09-05 DIAGNOSIS — C9201 Acute myeloblastic leukemia, in remission: Secondary | ICD-10-CM

## 2020-09-05 DIAGNOSIS — C9200 Acute myeloblastic leukemia, not having achieved remission: Secondary | ICD-10-CM | POA: Diagnosis not present

## 2020-09-05 DIAGNOSIS — D696 Thrombocytopenia, unspecified: Secondary | ICD-10-CM

## 2020-09-05 LAB — SAMPLE TO BLOOD BANK

## 2020-09-05 MED ORDER — SODIUM CHLORIDE 0.9% IV SOLUTION
250.0000 mL | Freq: Once | INTRAVENOUS | Status: AC
  Administered 2020-09-05: 250 mL via INTRAVENOUS
  Filled 2020-09-05: qty 250

## 2020-09-05 MED ORDER — DIPHENHYDRAMINE HCL 25 MG PO CAPS
25.0000 mg | ORAL_CAPSULE | Freq: Once | ORAL | Status: AC
  Administered 2020-09-05: 25 mg via ORAL
  Filled 2020-09-05: qty 1

## 2020-09-05 MED ORDER — HEPARIN SOD (PORK) LOCK FLUSH 100 UNIT/ML IV SOLN
500.0000 [IU] | Freq: Every day | INTRAVENOUS | Status: AC | PRN
  Administered 2020-09-05: 500 [IU]
  Filled 2020-09-05: qty 5

## 2020-09-05 MED ORDER — SODIUM CHLORIDE 0.9% FLUSH
10.0000 mL | INTRAVENOUS | Status: AC | PRN
  Administered 2020-09-05: 10 mL
  Filled 2020-09-05: qty 10

## 2020-09-05 MED ORDER — ACETAMINOPHEN 325 MG PO TABS
650.0000 mg | ORAL_TABLET | Freq: Once | ORAL | Status: AC
  Administered 2020-09-05: 650 mg via ORAL
  Filled 2020-09-05: qty 2

## 2020-09-05 NOTE — Progress Notes (Signed)
Pt received one unit of platelets with difficulty. VSS, d/ced home

## 2020-09-06 ENCOUNTER — Inpatient Hospital Stay: Payer: No Typology Code available for payment source

## 2020-09-06 LAB — CBC WITH DIFFERENTIAL/PLATELET
Abs Immature Granulocytes: 0 10*3/uL (ref 0.00–0.07)
Basophils Absolute: 0 10*3/uL (ref 0.0–0.1)
Basophils Relative: 0 %
Eosinophils Absolute: 0 10*3/uL (ref 0.0–0.5)
Eosinophils Relative: 0 %
HCT: 28 % — ABNORMAL LOW (ref 39.0–52.0)
Hemoglobin: 9.1 g/dL — ABNORMAL LOW (ref 13.0–17.0)
Immature Granulocytes: 0 %
Lymphocytes Relative: 84 %
Lymphs Abs: 0.1 10*3/uL — ABNORMAL LOW (ref 0.7–4.0)
MCH: 29.4 pg (ref 26.0–34.0)
MCHC: 32.5 g/dL (ref 30.0–36.0)
MCV: 90.3 fL (ref 80.0–100.0)
Monocytes Absolute: 0 10*3/uL — ABNORMAL LOW (ref 0.1–1.0)
Monocytes Relative: 8 %
Neutro Abs: 0 10*3/uL — CL (ref 1.7–7.7)
Neutrophils Relative %: 8 %
Platelets: 8 10*3/uL — CL (ref 150–400)
RBC Morphology: NONE SEEN
RBC: 3.1 MIL/uL — ABNORMAL LOW (ref 4.22–5.81)
RDW: 15.7 % — ABNORMAL HIGH (ref 11.5–15.5)
WBC Morphology: ABNORMAL
WBC: 0.1 10*3/uL — CL (ref 4.0–10.5)
nRBC: 0 % (ref 0.0–0.2)

## 2020-09-07 ENCOUNTER — Other Ambulatory Visit: Payer: Self-pay

## 2020-09-07 ENCOUNTER — Inpatient Hospital Stay: Payer: No Typology Code available for payment source

## 2020-09-07 LAB — PREPARE PLATELET PHERESIS: Unit division: 0

## 2020-09-07 LAB — BPAM PLATELET PHERESIS
Blood Product Expiration Date: 202201132359
ISSUE DATE / TIME: 202201101344
Unit Type and Rh: 5100

## 2020-09-08 ENCOUNTER — Inpatient Hospital Stay: Payer: No Typology Code available for payment source

## 2020-09-09 ENCOUNTER — Inpatient Hospital Stay: Payer: No Typology Code available for payment source

## 2020-09-10 ENCOUNTER — Telehealth: Payer: Self-pay | Admitting: Hematology and Oncology

## 2020-09-10 NOTE — Telephone Encounter (Signed)
Re:  Discharge from [facility]  I spoke to the nurse yesterday who is taking care of Mr. Letourneau.   He was admitted from 09/06/2020 - 09/09/2020 with fever and neutropenia.  He was found to be COVID-19 positive. He was initially treated with vancomycin and cefepime.  Cultures were negative.  He received remdesivir x3 days.  CBC on 09/08/2020 included a hematocrit of 27.4, hemoglobin 8.9, MCV 94.4, platelets 15,000, WBC 1800 with an ANC of 1300. CBC on 09/09/2020 included a hematocrit of 27.0, hemoglobin 8.4, MCV 97.1, platelets 24,000, WBC 8500 with an ANC of 7300.  The nurse was initially contacted me to set up follow-up labs on a Monday Wednesday and Friday basis [place] in office. We discussed that COVID-positive patients could not be seen in our clinic. I then spoke to Dr. Gloriann Loan who reviewed his blood counts and did not feel he requires follow-up labs at this time.   Lequita Asal, MD

## 2020-09-12 ENCOUNTER — Inpatient Hospital Stay: Payer: No Typology Code available for payment source

## 2020-09-12 ENCOUNTER — Inpatient Hospital Stay: Payer: No Typology Code available for payment source | Admitting: Hematology and Oncology

## 2020-09-28 ENCOUNTER — Telehealth: Payer: Self-pay

## 2020-09-28 ENCOUNTER — Telehealth: Payer: Self-pay | Admitting: Hematology and Oncology

## 2020-09-28 NOTE — Telephone Encounter (Signed)
09/28/2020 Spoke with pt and informed him that per [facility] oncology staff, appt was made for him to receive platelet transfusion and see Dr. Loletha Grayer on 10/11/20. Also, it was requested that he get a COVID test, which was scheduled for 10/05/20 @ 10 am. I gave all appt info to pt and he confirmed he will be there. He will receive an updated AVS with all upcoming appts on 10/11/20 SRW

## 2020-09-28 NOTE — Telephone Encounter (Signed)
  I have not heard from Dr Royce Macadamia.  When is he being admitted to [facility] to receive his 3rd cycle of chemotherapy?   When is to receive his Margarette [place]?  When is he seeing me?  M

## 2020-09-29 NOTE — Telephone Encounter (Signed)
Traci Sermon, when you spoke to [employer] yesterday, did they relay any of this info? If not let me know and ill call and ask

## 2020-09-29 NOTE — Telephone Encounter (Signed)
Yes he did relay the info below and its also under chart review > encounter [place] 09/28/2020 Care plan reviewed Treatment Referral 09/28/2020 Start cycle 3 HiDAC 1-2-3 Referral Setup 09/28/2020 Anticipate reevaluation by the bone marrow transplant team Treatment Referral 09/28/2020 Thrice weekly lab/transfusion support visits with Dr. Nolon Stalls Medication Management 09/28/2020 Udenyca coordinated for administration of 10/11/2020 Appointment 09/28/2020 10/26/2020

## 2020-10-05 ENCOUNTER — Other Ambulatory Visit
Admission: RE | Admit: 2020-10-05 | Discharge: 2020-10-05 | Disposition: A | Discharge status: Home / Self Care | Payer: No Typology Code available for payment source | Source: Ambulatory Visit | Attending: Hematology and Oncology | Admitting: Hematology and Oncology

## 2020-10-05 NOTE — Progress Notes (Signed)
Arrived at the covid testing site, was covid positive on 09/06/2020 verified in care everywhere labs. Patient stated that he did not have a repeat test after that. Per policy, the patient is not required to have a repeat covid test for 90 days after this positive test. Patient instructed of this and sent away without being tested.

## 2020-10-10 NOTE — Progress Notes (Signed)
[facility]  [address], [address] [place], [place] [address] Phone: [phone]  Fax: [phone]   Clinic Day: 10/11/20  Referring physician: Lequita Asal, MD  Chief Complaint: Carl Winner. is a 45 y.o. male with AML who is seen for assessment on day 5 of cycle #3 HiDAC.  HPI: The patient was last seen in the medical oncology clinic on 08/30/2020. At that time, he felt "alright".  He denied any bleeding.  He was stressed about insurance issues. Hematocrit was 33.1, hemoglobin 10.5, MCV 92.5, platelets 201,000, WBC 2,400 (ANC 2,300).  He was unable to receive growth factor support that day secondary to a change in his insurance.  He received Neulasta on 08/31/2020.  He was admitted to [facility] from 09/06/2020 - 09/09/2020 with neutropenic fever. He was COVID-19 positive and was started on Remdesivir x 3 days.  As he was neutropenic, he was treated initially with vancomycin and cefepime.  He remained afebrile.  Counts recovered.  He was discharged on day 16 of cycle #2 HiDAC.  Labs at discharge included a hematocrit of 27.0, hemoglobin 8.4, platelets 24,000, WBC 8500 with an ANC of 7300.  He received 1 unit of platelets on 09/05/2020.  The patient saw Dr. Gloriann Loan on 09/28/2020.  Discuss plan for cycle #3 consolidation within 2 weeks. Recommendation was to pursue allogenic stem cell transplant. The patient expressed concerns about his local family's ability to act as caregivers for him. The transplant team social worker was going to connect with the patient.  Labs followed:  09/02/2020:  Hematocrit 29.7, hemoglobin   9.9, MCV   91.1, platelets   78,000, WBC    200 (ANC    100). 09/05/2020:  Hematocrit 28.0, hemoglobin   9.1, MCV   90.3, platelets     8,000, WBC    100 (ANC        0). 09/07/2020:  Hematocrit 29.2, hemoglobin   9.1, MCV   96.2, platelets   30,000, WBC    500 (ANC    300). 09/08/2020:  Hematocrit 27.4, hemoglobin   8.9, MCV   94.4,  platelets   15,000, WBC 1,800 (ANC 1,300). 09/09/2020:  Hematocrit 27.0, hemoglobin   8.4, MCV   97.1, platelets   24,000, WBC 8,500 (ANC 7,300). 09/28/2020:  Hematocrit 35.8, hemoglobin 10.8, MCV 103.4, platelets 440,000, WBC 2,700. 10/07/2020:  Hematocrit 40.1, hemoglobin 12.0, MCV 102.3, platelets 303,000, WBC 3,200 (ANC 2,100).  He was admitted to [facility] from 10/07/2020 - 10/10/2020 for cycle #3 HiDAC.  He tolerated his chemotherapy well.  CBC at discharge included a hematocrit 36.3, hemoglobin 11.0, MCV 102.5, platelets 241,000, WBC 5700 with an ANC of 5400.  During the interim, he has been "here." His energy level is "great." He has a toothache due to cavities. He denies fevers, chills, shortness of breath, chest pain, cough, nausea, vomiting, diarrhea, and joint pain.  He received the COVID-19 vaccine yesterday.   Past Medical History:  Diagnosis Date  . Acute myeloid leukemia in remission ([place])   . Agranulocytosis secondary to cancer chemotherapy (CODE) ([place])   . Chemotherapy induced neutropenia ([place])   . Pancreatitis   . Pancytopenia ([place])   . Thrombocytopenia ([place])   . Tobacco use disorder     History reviewed. No pertinent surgical history.  Family History  Problem Relation Age of Onset  . Prostate cancer Neg Hx   . Kidney cancer Neg Hx   . Bladder Cancer Neg Hx     Social  History:  reports that he quit smoking about 6 months ago. His smoking use included cigarettes. He has a 17.00 pack-year smoking history. He has never used smokeless tobacco. He reports previous alcohol use of about 6.0 standard drinks of alcohol per week. He reports that he does not use drugs.The patient is alone today.  Allergies:  Allergies  Allergen Reactions  . Dapsone     Contraindication - G6PD deficient  . Primaquine     Contraindication - G6PD deficient  . Rasburicase     Contraindication - G6PD deficient    Current Medications: Current Outpatient Medications  Medication Sig Dispense  Refill  . levofloxacin (LEVAQUIN) 500 MG tablet Take by mouth.    . Multiple Vitamin (MULTIVITAMIN WITH MINERALS) TABS tablet Take 1 tablet by mouth daily.    . prednisoLONE acetate (PRED FORTE) 1 % ophthalmic suspension Administer 2 drops to both eyes every six (6) hours for 7 days.    Marland Kitchen thiamine 100 MG tablet Take 1 tablet (100 mg total) by mouth daily.    . valACYclovir (VALTREX) 500 MG tablet Take 500 mg by mouth daily.    . folic acid (FOLVITE) 1 MG tablet Take 1 tablet (1 mg total) by mouth daily. (Patient not taking: No sig reported)    . HYDROcodone-acetaminophen (NORCO) 5-325 MG tablet Take 1 tablet by mouth every 6 (six) hours as needed for moderate pain. (Patient not taking: No sig reported) 12 tablet 0  . magnesium oxide (MAG-OX) 400 MG tablet Take 800 mg by mouth daily. (Patient not taking: No sig reported)    . prochlorperazine (COMPAZINE) 10 MG tablet Take by mouth. (Patient not taking: No sig reported)     No current facility-administered medications for this visit.    Review of Systems  Constitutional: Negative for chills, diaphoresis, fever, malaise/fatigue and weight loss (up 16 lbs since 07/2020).       "Here." Energy is "great."  HENT: Negative for congestion, ear discharge, ear pain, hearing loss, nosebleeds, sinus pain, sore throat and tinnitus.        Toothache.  Eyes: Negative for blurred vision.  Respiratory: Negative for cough, hemoptysis, sputum production and shortness of breath.   Cardiovascular: Negative for chest pain, palpitations and leg swelling.  Gastrointestinal: Negative for abdominal pain, blood in stool, constipation, diarrhea, heartburn, melena, nausea and vomiting.  Genitourinary: Negative for dysuria, frequency, hematuria and urgency.  Musculoskeletal: Negative for back pain, joint pain, myalgias and neck pain.  Skin: Negative for itching and rash.  Neurological: Negative for dizziness, tingling, sensory change, weakness and headaches.   Endo/Heme/Allergies: Does not bruise/bleed easily.  Psychiatric/Behavioral: Negative for depression and memory loss. The patient is not nervous/anxious and does not have insomnia.   All other systems reviewed and are negative.  Performance status (ECOG): 0  Vitals Blood pressure 125/74, pulse 80, temperature (!) 96.1 F (35.6 C), temperature source Tympanic, resp. rate 16, weight 205 lb 11 oz (93.3 kg), SpO2 100 %.   Physical Exam Vitals and nursing note reviewed.  Constitutional:      General: He is not in acute distress.    Appearance: He is not diaphoretic.  HENT:     Head: Normocephalic and atraumatic.     Mouth/Throat:     Mouth: Mucous membranes are moist.     Pharynx: Oropharynx is clear.     Comments: Cavities. Eyes:     General: No scleral icterus.    Extraocular Movements: Extraocular movements intact.     Conjunctiva/sclera:  Conjunctivae normal.     Pupils: Pupils are equal, round, and reactive to light.  Cardiovascular:     Rate and Rhythm: Normal rate and regular rhythm.     Heart sounds: Normal heart sounds. No murmur heard.   Pulmonary:     Effort: Pulmonary effort is normal. No respiratory distress.     Breath sounds: Normal breath sounds. No wheezing or rales.  Chest:     Chest wall: No tenderness.  Breasts:     Right: No axillary adenopathy or supraclavicular adenopathy.     Left: No axillary adenopathy or supraclavicular adenopathy.      Comments: Port-a-cath site unremarkable. Abdominal:     General: Bowel sounds are normal. There is no distension.     Palpations: Abdomen is soft. There is no mass.     Tenderness: There is no abdominal tenderness. There is no guarding or rebound.  Musculoskeletal:        General: No swelling or tenderness. Normal range of motion.     Cervical back: Normal range of motion and neck supple.  Lymphadenopathy:     Head:     Right side of head: No preauricular, posterior auricular or occipital adenopathy.     Left  side of head: No preauricular, posterior auricular or occipital adenopathy.     Cervical: No cervical adenopathy.     Upper Body:     Right upper body: No supraclavicular or axillary adenopathy.     Left upper body: No supraclavicular or axillary adenopathy.     Lower Body: No right inguinal adenopathy. No left inguinal adenopathy.  Skin:    General: Skin is warm and dry.  Neurological:     Mental Status: He is alert and oriented to person, place, and time.  Psychiatric:        Behavior: Behavior normal.        Thought Content: Thought content normal.        Judgment: Judgment normal.    Appointment on 10/11/2020  Component Date Value Ref Range Status  . WBC 10/11/2020 4.3  4.0 - 10.5 K/uL Final  . RBC 10/11/2020 3.62* 4.22 - 5.81 MIL/uL Final  . Hemoglobin 10/11/2020 11.0* 13.0 - 17.0 g/dL Final  . HCT 10/11/2020 34.3* 39.0 - 52.0 % Final  . MCV 10/11/2020 94.8  80.0 - 100.0 fL Final  . MCH 10/11/2020 30.4  26.0 - 34.0 pg Final  . MCHC 10/11/2020 32.1  30.0 - 36.0 g/dL Final  . RDW 10/11/2020 17.5* 11.5 - 15.5 % Final  . Platelets 10/11/2020 202  150 - 400 K/uL Final  . nRBC 10/11/2020 0.0  0.0 - 0.2 % Final   Performed at [facility], [address] [place], [place] [address]  . Neutrophils Relative % 10/11/2020 PENDING  % Incomplete  . Neutro Abs 10/11/2020 PENDING  1.7 - 7.7 K/uL Incomplete  . Band Neutrophils 10/11/2020 PENDING  % Incomplete  . Lymphocytes Relative 10/11/2020 PENDING  % Incomplete  . Lymphs Abs 10/11/2020 PENDING  0.7 - 4.0 K/uL Incomplete  . Monocytes Relative 10/11/2020 PENDING  % Incomplete  . Monocytes Absolute 10/11/2020 PENDING  0.1 - 1.0 K/uL Incomplete  . Eosinophils Relative 10/11/2020 PENDING  % Incomplete  . Eosinophils Absolute 10/11/2020 PENDING  0.0 - 0.5 K/uL Incomplete  . Basophils Relative 10/11/2020 PENDING  % Incomplete  . Basophils Absolute 10/11/2020 PENDING  0.0 - 0.1 K/uL Incomplete  . WBC Morphology 10/11/2020  PENDING   Incomplete  . RBC  Morphology 10/11/2020 PENDING   Incomplete  . Smear Review 10/11/2020 PENDING   Incomplete  . Other 10/11/2020 PENDING  % Incomplete  . nRBC 10/11/2020 PENDING  0 /100 WBC Incomplete  . Metamyelocytes Relative 10/11/2020 PENDING  % Incomplete  . Myelocytes 10/11/2020 PENDING  % Incomplete  . Promyelocytes Relative 10/11/2020 PENDING  % Incomplete  . Blasts 10/11/2020 PENDING  % Incomplete  . Immature Granulocytes 10/11/2020 PENDING  % Incomplete  . Abs Immature Granulocytes 10/11/2020 PENDING  0.00 - 0.07 K/uL Incomplete    Assessment:  Carl Adaiah Morken. 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  He has received 2 courses of induction chemotherapy with daunorubicin and cytarabine.  Bone marrow on 04/01/2020 revealed AML.  Bone marrow on 04/26/2020 was hypocellular (5-10%) with 5% blasts.  Bone marrow on 05/24/2020 revealed 30% cellularity with 10% blasts and focal clustering up to 30-40%.  Bone marrow on 07/05/2020 revealed 50% cellularity with 2% blasts.  MRD was negative by flow cytometry.  He received cytarabine and daunorubicin (7+3) beginning 04/13/2020 and 05/27/2020.  Course has been complicated by fever and neutropenia with mucositis (04/27/2020) and gluteal cleft cellulitis (05/06/2020).  He is day 5 s/p cycle #3 HiDAC consolidation with Udenyca support (began 07/25/2020 - 10/07/2020).  He has required platelet transfusion support: 2 units (08/04/2020 and 08/05/2020) with cycle #1 and 1 unit (09/05/2020) with cycle #2.  He has G6PD deficiency.  G6PD was 1.0 (5.3-10.3) on 04/02/2020.  He received the [facility] COVID-19 vaccine on 07/27/2020 and 10/09/2020.  Symptomatically, his energy level is "great." He has a toothache due to cavities. He denies any fevers, sweats or chills.  Exam is stable.  WBC is 4300.  Plan: 1.   Labs today: CBC with diff. 2.   Acute myelogenous leukemia  He is s/p 2 courses of induction  daunorubicin and cytarabine.  He is day 5 s/p cycle #3 high-dose ara-C (10/07/2020).   Discuss cycle #2 HiDAC   He tolerated treatment well.   He denies any nausea, vomiting or diarrhea.   Discussed plans for Neulasta today  Review plan to monitor CBC 3 times weekly s/p chemotherapy.  Blood products are leukoreduced and irradiated.  Transfusion requirement:    2 units PRBCs for hemoglobin < 8   1 unit platelets for platelet count < 10,000, bleeding or invasive procedure  With cycle #1, he required platelet transfusion support on day 11 and 12.  Cycle #2, he required platelet transfusion support on day 11.  He remains on prophylactic antibiotics (confirmed).   Levaquin, fluconazole, valacyclovir.  He states 5 siblings are being tested for possible match for an allogeneic transplant.   Currently he does not have a caregiver.  Discuss symptom management.  He has antiemetics at home to use on a prn bases.  Interventions are adequate.      3.   Neulasta today. 4.   RTC on Friday in [place] for labs (CBC with diff). 5.   RTC on 10/17/2020 for MD assessment and labs (CBC with diff, hold tube). 6.   RTC on Mondays, Wednesdays, and Fridays  beginning 02/21 x 2 weeks for labs (CBC with diff, hold tube) and +/- platelet transfusion.  I discussed the assessment and treatment plan with the patient.  The patient was provided an opportunity to ask questions and all were answered.  The patient agreed with the plan and demonstrated an understanding of the instructions.  The patient was advised to call back  if the symptoms worsen or if the condition fails to improve as anticipated.  I provided 12 minutes of face-to-face time during this this encounter and > 50% was spent counseling as documented under my assessment and plan.  An additional 15 minutes were spent reviewing her chart (Epic and Care Everywhere) including notes, labs, and imaging studies.    Tawn Fitzner C. Mike Gip, MD, PhD 10/11/2020, 5:00  PM  I, Mirian Mo Tufford, am acting as Education administrator for [employer]. Mike Gip, MD, PhD.  I, Leilan Bochenek C. Mike Gip, MD, have reviewed the above documentation for accuracy and completeness, and I agree with the above.

## 2020-10-11 ENCOUNTER — Other Ambulatory Visit: Payer: Self-pay

## 2020-10-11 ENCOUNTER — Ambulatory Visit: Payer: No Typology Code available for payment source

## 2020-10-11 ENCOUNTER — Inpatient Hospital Stay: Payer: No Typology Code available for payment source

## 2020-10-11 ENCOUNTER — Encounter: Payer: Self-pay | Admitting: Hematology and Oncology

## 2020-10-11 ENCOUNTER — Inpatient Hospital Stay
Payer: No Typology Code available for payment source | Attending: Hematology and Oncology | Admitting: Hematology and Oncology

## 2020-10-11 VITALS — BP 125/74 | HR 80 | Temp 96.1°F | Resp 16 | Wt 205.7 lb

## 2020-10-11 DIAGNOSIS — C9201 Acute myeloblastic leukemia, in remission: Secondary | ICD-10-CM

## 2020-10-11 DIAGNOSIS — C9200 Acute myeloblastic leukemia, not having achieved remission: Secondary | ICD-10-CM | POA: Diagnosis present

## 2020-10-11 DIAGNOSIS — Z5189 Encounter for other specified aftercare: Secondary | ICD-10-CM | POA: Diagnosis not present

## 2020-10-11 LAB — CBC WITH DIFFERENTIAL/PLATELET
Abs Immature Granulocytes: 0.02 10*3/uL (ref 0.00–0.07)
Basophils Absolute: 0 10*3/uL (ref 0.0–0.1)
Basophils Relative: 0 %
Eosinophils Absolute: 0 10*3/uL (ref 0.0–0.5)
Eosinophils Relative: 0 %
HCT: 34.3 % — ABNORMAL LOW (ref 39.0–52.0)
Hemoglobin: 11 g/dL — ABNORMAL LOW (ref 13.0–17.0)
Immature Granulocytes: 0 %
Lymphocytes Relative: 1 %
Lymphs Abs: 0.1 10*3/uL — ABNORMAL LOW (ref 0.7–4.0)
MCH: 30.4 pg (ref 26.0–34.0)
MCHC: 32.1 g/dL (ref 30.0–36.0)
MCV: 94.8 fL (ref 80.0–100.0)
Monocytes Absolute: 0 10*3/uL — ABNORMAL LOW (ref 0.1–1.0)
Monocytes Relative: 0 %
Neutro Abs: 4.6 10*3/uL (ref 1.7–7.7)
Neutrophils Relative %: 99 %
Platelets: 202 10*3/uL (ref 150–400)
RBC: 3.62 MIL/uL — ABNORMAL LOW (ref 4.22–5.81)
RDW: 17.5 % — ABNORMAL HIGH (ref 11.5–15.5)
Smear Review: NORMAL
WBC: 4.3 10*3/uL (ref 4.0–10.5)
nRBC: 0 % (ref 0.0–0.2)

## 2020-10-11 MED ORDER — PEGFILGRASTIM INJECTION 6 MG/0.6ML ~~LOC~~
6.0000 mg | PREFILLED_SYRINGE | Freq: Once | SUBCUTANEOUS | Status: AC
  Administered 2020-10-11: 6 mg via SUBCUTANEOUS
  Filled 2020-10-11: qty 0.6

## 2020-10-11 NOTE — Progress Notes (Signed)
Pt here for neulasta and lab work, VSS, d/ced home

## 2020-10-11 NOTE — Progress Notes (Signed)
Patient states no new concerns. 

## 2020-10-13 ENCOUNTER — Inpatient Hospital Stay: Payer: No Typology Code available for payment source

## 2020-10-13 ENCOUNTER — Other Ambulatory Visit: Payer: Self-pay

## 2020-10-13 DIAGNOSIS — C9200 Acute myeloblastic leukemia, not having achieved remission: Secondary | ICD-10-CM | POA: Diagnosis not present

## 2020-10-13 DIAGNOSIS — C9201 Acute myeloblastic leukemia, in remission: Secondary | ICD-10-CM

## 2020-10-13 LAB — CBC WITH DIFFERENTIAL/PLATELET
Abs Immature Granulocytes: 0.2 10*3/uL — ABNORMAL HIGH (ref 0.00–0.07)
Basophils Absolute: 0 10*3/uL (ref 0.0–0.1)
Basophils Relative: 0 %
Eosinophils Absolute: 0 10*3/uL (ref 0.0–0.5)
Eosinophils Relative: 0 %
HCT: 32.8 % — ABNORMAL LOW (ref 39.0–52.0)
Hemoglobin: 10.4 g/dL — ABNORMAL LOW (ref 13.0–17.0)
Immature Granulocytes: 5 %
Lymphocytes Relative: 1 %
Lymphs Abs: 0.1 10*3/uL — ABNORMAL LOW (ref 0.7–4.0)
MCH: 29.8 pg (ref 26.0–34.0)
MCHC: 31.7 g/dL (ref 30.0–36.0)
MCV: 94 fL (ref 80.0–100.0)
Monocytes Absolute: 0 10*3/uL — ABNORMAL LOW (ref 0.1–1.0)
Monocytes Relative: 0 %
Neutro Abs: 4 10*3/uL (ref 1.7–7.7)
Neutrophils Relative %: 94 %
Platelets: 92 10*3/uL — ABNORMAL LOW (ref 150–400)
RBC: 3.49 MIL/uL — ABNORMAL LOW (ref 4.22–5.81)
RDW: 17.2 % — ABNORMAL HIGH (ref 11.5–15.5)
WBC: 4.3 10*3/uL (ref 4.0–10.5)
nRBC: 0 % (ref 0.0–0.2)

## 2020-10-13 NOTE — Progress Notes (Signed)
[facility]  [address] [place], [place] [address] Phone: [phone]  Fax: [phone]   Clinic Day: 10/17/20  Referring physician: Lequita Asal, MD  Chief Complaint: Carl [name]. is a 45 y.o. male with AML who is seen for assessment on day 11 of cycle #3 HiDAC.  HPI: The patient was last seen in the medical oncology clinic on 10/11/2020. At that time, he was seen for assessment after interval hospitalization for cycle #3 HiDAC.  Energy level was good.  He denied any symptoms.  Hematocrit was 34.3, hemoglobin 11.0, MCV 94.8, platelets 202,000, WBC 4,300.  He received Neulasta.  CBC has been followed: 10/13/2020:  Hematocrit 32.8, hemoglobin 10.4, MCV 94.0, platelets 92,000, WBC 4,300 with an ANC of 4000. 10/14/2020:  Hematocrit 33.0, hemoglobin 10.7, MCV 94.0, platelets 50,000, WBC    600 with an ANC of   300.  During the interim, he has been "bored." He denies mouth tenderness today. He has pain on the medial aspect of his left knee. He denies any trauma or injury to the knee. The patient denies fevers, chills, sweats, chest pain, shortness of breath, nausea, vomiting, diarrhea, bruising, or bleeding.   Past Medical History:  Diagnosis Date  . Acute myeloid leukemia in remission ([place])   . Agranulocytosis secondary to cancer chemotherapy (CODE) ([place])   . Chemotherapy induced neutropenia ([place])   . Pancreatitis   . Pancytopenia ([place])   . Thrombocytopenia ([place])   . Tobacco use disorder     History reviewed. No pertinent surgical history.  Family History  Problem Relation Age of Onset  . Prostate cancer Neg Hx   . Kidney cancer Neg Hx   . Bladder Cancer Neg Hx     Social History:  reports that he quit smoking about 6 months ago. His smoking use included cigarettes. He has a 17.00 pack-year smoking history. He has never used smokeless tobacco. He reports previous alcohol use of about 6.0 standard drinks of alcohol per week. He  reports that he does not use drugs.The patient is alone today.  Allergies:  Allergies  Allergen Reactions  . Dapsone     Contraindication - G6PD deficient  . Primaquine     Contraindication - G6PD deficient  . Rasburicase     Contraindication - G6PD deficient    Current Medications: Current Outpatient Medications  Medication Sig Dispense Refill  . levofloxacin (LEVAQUIN) 500 MG tablet Take by mouth daily.    . magnesium oxide (MAG-OX) 400 MG tablet Take 800 mg by mouth daily.    . Multiple Vitamin (MULTIVITAMIN WITH MINERALS) TABS tablet Take 1 tablet by mouth daily.    . prednisoLONE acetate (PRED FORTE) 1 % ophthalmic suspension Administer 2 drops to both eyes every six (6) hours for 7 days.    Marland Kitchen thiamine 100 MG tablet Take 1 tablet (100 mg total) by mouth daily.    . valACYclovir (VALTREX) 500 MG tablet Take 500 mg by mouth daily.    . folic acid (FOLVITE) 1 MG tablet Take 1 tablet (1 mg total) by mouth daily. (Patient not taking: No sig reported)    . HYDROcodone-acetaminophen (NORCO) 5-325 MG tablet Take 1 tablet by mouth every 6 (six) hours as needed for moderate pain. (Patient not taking: No sig reported) 12 tablet 0  . prochlorperazine (COMPAZINE) 10 MG tablet Take by mouth. (Patient not taking: No sig reported)     No current facility-administered medications for this visit.    Review  of Systems  Constitutional: Negative for chills, diaphoresis, fever, malaise/fatigue and weight loss (stable).       Feels "bored."  HENT: Negative for congestion, ear discharge, ear pain, hearing loss, nosebleeds, sinus pain, sore throat and tinnitus.   Eyes: Negative for blurred vision.  Respiratory: Negative for cough, hemoptysis, sputum production and shortness of breath.   Cardiovascular: Negative for chest pain, palpitations and leg swelling.  Gastrointestinal: Negative for abdominal pain, blood in stool, constipation, diarrhea, heartburn, melena, nausea and vomiting.  Genitourinary:  Negative for dysuria, frequency, hematuria and urgency.  Musculoskeletal: Negative for back pain, joint pain, myalgias and neck pain.       Medial aspect of left knee  Skin: Negative for itching and rash.  Neurological: Negative for dizziness, tingling, sensory change, weakness and headaches.  Endo/Heme/Allergies: Does not bruise/bleed easily.  Psychiatric/Behavioral: Negative for depression and memory loss. The patient is not nervous/anxious and does not have insomnia.   All other systems reviewed and are negative.  Performance status (ECOG): 0  Vitals Blood pressure 113/69, pulse 88, temperature (!) 96.9 F (36.1 C), temperature source Tympanic, weight 205 lb 4 oz (93.1 kg), SpO2 100 %.   Physical Exam Vitals and nursing note reviewed.  Constitutional:      General: He is not in acute distress.    Appearance: He is not diaphoretic.  HENT:     Head: Normocephalic and atraumatic.     Mouth/Throat:     Mouth: Mucous membranes are moist.     Pharynx: Oropharynx is clear.  Eyes:     General: No scleral icterus.    Extraocular Movements: Extraocular movements intact.     Conjunctiva/sclera: Conjunctivae normal.     Pupils: Pupils are equal, round, and reactive to light.  Cardiovascular:     Rate and Rhythm: Normal rate and regular rhythm.     Heart sounds: Normal heart sounds. No murmur heard.   Pulmonary:     Effort: Pulmonary effort is normal. No respiratory distress.     Breath sounds: Normal breath sounds. No wheezing or rales.  Chest:     Chest wall: No tenderness.  Breasts:     Right: No axillary adenopathy or supraclavicular adenopathy.     Left: No axillary adenopathy or supraclavicular adenopathy.    Abdominal:     General: Bowel sounds are normal. There is no distension.     Palpations: Abdomen is soft. There is no mass.     Tenderness: There is no abdominal tenderness. There is no guarding or rebound.  Musculoskeletal:        General: No swelling or  tenderness. Normal range of motion.     Cervical back: Normal range of motion and neck supple.     Comments: No left knee swelling.  Lymphadenopathy:     Head:     Right side of head: No preauricular, posterior auricular or occipital adenopathy.     Left side of head: No preauricular, posterior auricular or occipital adenopathy.     Cervical: No cervical adenopathy.     Upper Body:     Right upper body: No supraclavicular or axillary adenopathy.     Left upper body: No supraclavicular or axillary adenopathy.     Lower Body: No right inguinal adenopathy. No left inguinal adenopathy.  Skin:    General: Skin is warm and dry.  Neurological:     Mental Status: He is alert and oriented to person, place, and time.  Psychiatric:  Behavior: Behavior normal.        Thought Content: Thought content normal.        Judgment: Judgment normal.    Appointment on 10/17/2020  Component Date Value Ref Range Status  . WBC 10/17/2020 0.1* 4.0 - 10.5 K/uL Final   Comment: This critical result has verified and been called to [facility] by Memory Argue on 02 21 2022 at 0940, and has been read back.  This critical result has verified and been called to [facility] by Memory Argue on 02 21 2022 at 0941, and has been read back.    Marland Kitchen RBC 10/17/2020 3.52* 4.22 - 5.81 MIL/uL Final  . Hemoglobin 10/17/2020 10.5* 13.0 - 17.0 g/dL Final  . HCT 10/17/2020 32.6* 39.0 - 52.0 % Final  . MCV 10/17/2020 92.6  80.0 - 100.0 fL Final  . MCH 10/17/2020 29.8  26.0 - 34.0 pg Final  . MCHC 10/17/2020 32.2  30.0 - 36.0 g/dL Final  . RDW 10/17/2020 16.5* 11.5 - 15.5 % Final  . Platelets 10/17/2020 6* 150 - 400 K/uL Final   Comment: Immature Platelet Fraction may be clinically indicated, consider ordering this additional test [id number] THIS CRITICAL RESULT HAS VERIFIED AND BEEN CALLED TO SANDRA HOOKER BY CYNTHIA COX ON 02 21 2022 AT 0940, AND HAS BEEN READ BACK.  THIS CRITICAL RESULT HAS VERIFIED AND BEEN CALLED TO  SANDRA HOOKER BY CYNTHIA COX ON 02 21 2022 AT 0941, AND HAS BEEN READ BACK.    Marland Kitchen nRBC 10/17/2020 0.0  0.0 - 0.2 % Final   Performed at [facility], [address] [place], [place] [address]  . Neutrophils Relative % 10/17/2020 PENDING  % Incomplete  . Neutro Abs 10/17/2020 PENDING  1.7 - 7.7 K/uL Incomplete  . Band Neutrophils 10/17/2020 PENDING  % Incomplete  . Lymphocytes Relative 10/17/2020 PENDING  % Incomplete  . Lymphs Abs 10/17/2020 PENDING  0.7 - 4.0 K/uL Incomplete  . Monocytes Relative 10/17/2020 PENDING  % Incomplete  . Monocytes Absolute 10/17/2020 PENDING  0.1 - 1.0 K/uL Incomplete  . Eosinophils Relative 10/17/2020 PENDING  % Incomplete  . Eosinophils Absolute 10/17/2020 PENDING  0.0 - 0.5 K/uL Incomplete  . Basophils Relative 10/17/2020 PENDING  % Incomplete  . Basophils Absolute 10/17/2020 PENDING  0.0 - 0.1 K/uL Incomplete  . WBC Morphology 10/17/2020 PENDING   Incomplete  . RBC Morphology 10/17/2020 PENDING   Incomplete  . Smear Review 10/17/2020 PENDING   Incomplete  . Other 10/17/2020 PENDING  % Incomplete  . nRBC 10/17/2020 PENDING  0 /100 WBC Incomplete  . Metamyelocytes Relative 10/17/2020 PENDING  % Incomplete  . Myelocytes 10/17/2020 PENDING  % Incomplete  . Promyelocytes Relative 10/17/2020 PENDING  % Incomplete  . Blasts 10/17/2020 PENDING  % Incomplete  . Immature Granulocytes 10/17/2020 PENDING  % Incomplete  . Abs Immature Granulocytes 10/17/2020 PENDING  0.00 - 0.07 K/uL Incomplete    Assessment:  Mahamadou Alucard Fearnow. 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  He has received 2 courses of induction chemotherapy with daunorubicin and cytarabine.  Bone marrow on 04/01/2020 revealed AML.  Bone marrow on 04/26/2020 was hypocellular (5-10%) with 5% blasts.  Bone marrow on 05/24/2020 revealed 30% cellularity with 10% blasts and focal clustering up to 30-40%.  Bone marrow on 07/05/2020 revealed 50% cellularity with  2% blasts.  MRD was negative by flow cytometry.  He received cytarabine and daunorubicin (7+3) beginning 04/13/2020 and 05/27/2020.  Course  has been complicated by fever and neutropenia with mucositis (04/27/2020) and gluteal cleft cellulitis (05/06/2020).  He is day 11 s/p cycle #3 HiDAC consolidation with Udenyca support (began 07/25/2020 - 10/07/2020).  He has required platelet transfusion support: 2 units (08/04/2020 and 08/05/2020) with cycle #1 unit (09/05/2020 with cycle #2.  He has G6PD deficiency.  G6PD was 1.0 (5.3-10.3) on 04/02/2020.  He received the [place] COVID-19 vaccine on 07/27/2020 and 10/10/2020.  Symptomatically, he notes pain on the medial aspect of his left knee. He denies any trauma or injury to the knee. He denies fevers, chills, sweats, chest pain, shortness of breath, nausea, vomiting, diarrhea, bruising, or bleeding.  Exm is unremarkable.  WBC is 100 (ANC 0).  Platelet count is 6,000.  Plan: 1.   Labs today: CBC with diff, hold tube. 2.   Acute myelogenous leukemia  He is s/p 2 courses of induction daunorubicin and cytarabine.  He is day 11 s/p cycle #3 high-dose ara-C (10/07/2020).    He is tolerating treatment well.   ANC is 0.  Platelet count is 6,000.  Transfuse 1 unit pheresed psoralen treated platelets today.   Check 1 hour post platelet count.  Continue to monitor counts daily during nadir secondary to transfusion needs.  Blood products are leukoreduced and irradiated.  Transfusion requirement:    2 units PRBCs for hemoglobin < 8   1 unit platelets for platelet count < 10,000, bleeding or invasive procedure.  Continue prophylactic antibiotics.   Levaquin, fluconazole, valacyclovir.  He remains unsure about bone marrow transplant.   Family members are being tested.  Discuss symptom management.  He has antiemetics at home to use on a prn bases.  Interventions are adequate.   3.   Leukopenia  WBC 100 with an ANC of 0.  He received Neulasta.  He is on  prophylactic antibiotics.  Continue neutropenic precautions. 4.   Mild left knee pain  Patient denies trauma.  Exam is unremarkable.  Gait is normal.  Supportive care suggested. 5.   Platelet transfusion today. 6.   Draw 1 hour post platelet count. 7.   Add labs (CBC with diff) tomorrow. He has appt for Wed and Friday this week. 8.   RTC in 1 week for MD assess and labs (CBC with diff, hold) and +/- transfusion.  I discussed the assessment and treatment plan with the patient.  The patient was provided an opportunity to ask questions and all were answered.  The patient agreed with the plan and demonstrated an understanding of the instructions.  The patient was advised to call back if the symptoms worsen or if the condition fails to improve as anticipated.   Lequita Asal, MD, PhD 10/17/2020, 5:00 PM  I, Mirian Mo Tufford, am acting as Education administrator for [employer]. Mike Gip, MD, PhD.  I, Doni Bacha C. Mike Gip, MD, have reviewed the above documentation for accuracy and completeness, and I agree with the above.

## 2020-10-14 ENCOUNTER — Telehealth: Payer: Self-pay

## 2020-10-14 ENCOUNTER — Inpatient Hospital Stay: Payer: No Typology Code available for payment source | Attending: Hematology and Oncology

## 2020-10-14 DIAGNOSIS — C9201 Acute myeloblastic leukemia, in remission: Secondary | ICD-10-CM | POA: Insufficient documentation

## 2020-10-14 LAB — CBC WITH DIFFERENTIAL/PLATELET
Abs Immature Granulocytes: 0.18 10*3/uL — ABNORMAL HIGH (ref 0.00–0.07)
Basophils Absolute: 0 10*3/uL (ref 0.0–0.1)
Basophils Relative: 2 %
Eosinophils Absolute: 0 10*3/uL (ref 0.0–0.5)
Eosinophils Relative: 0 %
HCT: 33 % — ABNORMAL LOW (ref 39.0–52.0)
Hemoglobin: 10.7 g/dL — ABNORMAL LOW (ref 13.0–17.0)
Immature Granulocytes: 30 %
Lymphocytes Relative: 12 %
Lymphs Abs: 0.1 10*3/uL — ABNORMAL LOW (ref 0.7–4.0)
MCH: 30.5 pg (ref 26.0–34.0)
MCHC: 32.4 g/dL (ref 30.0–36.0)
MCV: 94 fL (ref 80.0–100.0)
Monocytes Absolute: 0 10*3/uL — ABNORMAL LOW (ref 0.1–1.0)
Monocytes Relative: 2 %
Neutro Abs: 0.3 10*3/uL — CL (ref 1.7–7.7)
Neutrophils Relative %: 54 %
Platelets: 50 10*3/uL — ABNORMAL LOW (ref 150–400)
RBC: 3.51 MIL/uL — ABNORMAL LOW (ref 4.22–5.81)
RDW: 16.8 % — ABNORMAL HIGH (ref 11.5–15.5)
WBC: 0.6 10*3/uL — CL (ref 4.0–10.5)
nRBC: 0 % (ref 0.0–0.2)

## 2020-10-14 LAB — SAMPLE TO BLOOD BANK

## 2020-10-14 NOTE — Telephone Encounter (Signed)
I spoke with patient in regards to his recent lab work. ( has a critical WBC of 0.6 and ANC of 0.3) and I went over neutropenic precautions.

## 2020-10-17 ENCOUNTER — Ambulatory Visit: Payer: No Typology Code available for payment source

## 2020-10-17 ENCOUNTER — Inpatient Hospital Stay: Payer: No Typology Code available for payment source

## 2020-10-17 ENCOUNTER — Inpatient Hospital Stay (HOSPITAL_BASED_OUTPATIENT_CLINIC_OR_DEPARTMENT_OTHER): Payer: No Typology Code available for payment source | Admitting: Hematology and Oncology

## 2020-10-17 ENCOUNTER — Other Ambulatory Visit: Payer: Self-pay | Admitting: Hematology and Oncology

## 2020-10-17 ENCOUNTER — Other Ambulatory Visit: Payer: Self-pay

## 2020-10-17 ENCOUNTER — Encounter: Payer: Self-pay | Admitting: Hematology and Oncology

## 2020-10-17 VITALS — BP 113/69 | HR 88 | Temp 96.9°F | Wt 205.2 lb

## 2020-10-17 DIAGNOSIS — D701 Agranulocytosis secondary to cancer chemotherapy: Secondary | ICD-10-CM | POA: Diagnosis not present

## 2020-10-17 DIAGNOSIS — T451X5A Adverse effect of antineoplastic and immunosuppressive drugs, initial encounter: Secondary | ICD-10-CM

## 2020-10-17 DIAGNOSIS — D696 Thrombocytopenia, unspecified: Secondary | ICD-10-CM

## 2020-10-17 DIAGNOSIS — C9201 Acute myeloblastic leukemia, in remission: Secondary | ICD-10-CM

## 2020-10-17 DIAGNOSIS — C9200 Acute myeloblastic leukemia, not having achieved remission: Secondary | ICD-10-CM | POA: Diagnosis not present

## 2020-10-17 LAB — PLATELET COUNT: Platelets: 25 10*3/uL — CL (ref 150–400)

## 2020-10-17 LAB — SAMPLE TO BLOOD BANK

## 2020-10-17 MED ORDER — HEPARIN SOD (PORK) LOCK FLUSH 100 UNIT/ML IV SOLN
250.0000 [IU] | INTRAVENOUS | Status: DC | PRN
  Filled 2020-10-17: qty 5

## 2020-10-17 MED ORDER — DIPHENHYDRAMINE HCL 25 MG PO CAPS
25.0000 mg | ORAL_CAPSULE | Freq: Once | ORAL | Status: AC
  Administered 2020-10-17: 25 mg via ORAL
  Filled 2020-10-17: qty 1

## 2020-10-17 MED ORDER — ACETAMINOPHEN 325 MG PO TABS
650.0000 mg | ORAL_TABLET | Freq: Once | ORAL | Status: AC
  Administered 2020-10-17: 650 mg via ORAL
  Filled 2020-10-17: qty 2

## 2020-10-17 MED ORDER — SODIUM CHLORIDE 0.9% IV SOLUTION
250.0000 mL | Freq: Once | INTRAVENOUS | Status: AC
  Administered 2020-10-17: 250 mL via INTRAVENOUS
  Filled 2020-10-17: qty 250

## 2020-10-17 MED ORDER — HEPARIN SOD (PORK) LOCK FLUSH 100 UNIT/ML IV SOLN
500.0000 [IU] | Freq: Every day | INTRAVENOUS | Status: AC | PRN
  Administered 2020-10-17: 500 [IU]
  Filled 2020-10-17: qty 5

## 2020-10-18 ENCOUNTER — Other Ambulatory Visit: Payer: Self-pay

## 2020-10-18 ENCOUNTER — Inpatient Hospital Stay: Payer: No Typology Code available for payment source

## 2020-10-18 ENCOUNTER — Telehealth: Payer: Self-pay

## 2020-10-18 DIAGNOSIS — C9200 Acute myeloblastic leukemia, not having achieved remission: Secondary | ICD-10-CM | POA: Diagnosis not present

## 2020-10-18 DIAGNOSIS — C9201 Acute myeloblastic leukemia, in remission: Secondary | ICD-10-CM

## 2020-10-18 LAB — CBC WITH DIFFERENTIAL/PLATELET
Abs Immature Granulocytes: 0 10*3/uL (ref 0.00–0.07)
Abs Immature Granulocytes: 0 10*3/uL (ref 0.00–0.07)
Basophils Absolute: 0 10*3/uL (ref 0.0–0.1)
Basophils Absolute: 0 10*3/uL (ref 0.0–0.1)
Basophils Relative: 0 %
Basophils Relative: 0 %
Eosinophils Absolute: 0 10*3/uL (ref 0.0–0.5)
Eosinophils Absolute: 0 10*3/uL (ref 0.0–0.5)
Eosinophils Relative: 10 %
Eosinophils Relative: 7 %
HCT: 32.6 % — ABNORMAL LOW (ref 39.0–52.0)
HCT: 31.8 % — ABNORMAL LOW (ref 39.0–52.0)
Hemoglobin: 10.5 g/dL — ABNORMAL LOW (ref 13.0–17.0)
Hemoglobin: 10.1 g/dL — ABNORMAL LOW (ref 13.0–17.0)
Immature Granulocytes: 0 %
Immature Granulocytes: 0 %
Lymphocytes Relative: 70 %
Lymphocytes Relative: 86 %
Lymphs Abs: 0.1 10*3/uL — ABNORMAL LOW (ref 0.7–4.0)
Lymphs Abs: 0.1 10*3/uL — ABNORMAL LOW (ref 0.7–4.0)
MCH: 29.8 pg (ref 26.0–34.0)
MCH: 29.4 pg (ref 26.0–34.0)
MCHC: 32.2 g/dL (ref 30.0–36.0)
MCHC: 31.8 g/dL (ref 30.0–36.0)
MCV: 92.6 fL (ref 80.0–100.0)
MCV: 92.7 fL (ref 80.0–100.0)
Monocytes Absolute: 0 10*3/uL — ABNORMAL LOW (ref 0.1–1.0)
Monocytes Absolute: 0 10*3/uL — ABNORMAL LOW (ref 0.1–1.0)
Monocytes Relative: 10 %
Monocytes Relative: 7 %
Neutro Abs: 0 10*3/uL — CL (ref 1.7–7.7)
Neutro Abs: 0 10*3/uL — CL (ref 1.7–7.7)
Neutrophils Relative %: 10 %
Neutrophils Relative %: 0 %
Platelets: 6 10*3/uL — CL (ref 150–400)
Platelets: 13 10*3/uL — CL (ref 150–400)
RBC: 3.52 MIL/uL — ABNORMAL LOW (ref 4.22–5.81)
RBC: 3.43 MIL/uL — ABNORMAL LOW (ref 4.22–5.81)
RDW: 16.5 % — ABNORMAL HIGH (ref 11.5–15.5)
RDW: 16.6 % — ABNORMAL HIGH (ref 11.5–15.5)
WBC: 0.1 10*3/uL — CL (ref 4.0–10.5)
WBC: 0.1 10*3/uL — CL (ref 4.0–10.5)
nRBC: 0 % (ref 0.0–0.2)
nRBC: 0 % (ref 0.0–0.2)

## 2020-10-18 LAB — BPAM PLATELET PHERESIS
Blood Product Expiration Date: 202202242359
ISSUE DATE / TIME: 202202211116
Unit Type and Rh: 5100

## 2020-10-18 LAB — PREPARE PLATELET PHERESIS: Unit division: 0

## 2020-10-18 NOTE — Addendum Note (Signed)
Addended by: Colin Mulders on: 10/18/2020 04:50 PM   Modules accepted: Orders

## 2020-10-19 ENCOUNTER — Inpatient Hospital Stay: Payer: No Typology Code available for payment source

## 2020-10-19 ENCOUNTER — Inpatient Hospital Stay (HOSPITAL_BASED_OUTPATIENT_CLINIC_OR_DEPARTMENT_OTHER): Payer: No Typology Code available for payment source | Admitting: Hematology and Oncology

## 2020-10-19 ENCOUNTER — Other Ambulatory Visit: Payer: Self-pay | Admitting: Hematology and Oncology

## 2020-10-19 ENCOUNTER — Other Ambulatory Visit: Payer: Self-pay

## 2020-10-19 ENCOUNTER — Encounter: Payer: Self-pay | Admitting: Hematology and Oncology

## 2020-10-19 VITALS — BP 125/68 | HR 77 | Temp 97.6°F | Resp 18 | Wt 200.6 lb

## 2020-10-19 DIAGNOSIS — D701 Agranulocytosis secondary to cancer chemotherapy: Secondary | ICD-10-CM | POA: Diagnosis not present

## 2020-10-19 DIAGNOSIS — D696 Thrombocytopenia, unspecified: Secondary | ICD-10-CM

## 2020-10-19 DIAGNOSIS — C9201 Acute myeloblastic leukemia, in remission: Secondary | ICD-10-CM

## 2020-10-19 DIAGNOSIS — C9200 Acute myeloblastic leukemia, not having achieved remission: Secondary | ICD-10-CM | POA: Diagnosis not present

## 2020-10-19 DIAGNOSIS — T451X5A Adverse effect of antineoplastic and immunosuppressive drugs, initial encounter: Secondary | ICD-10-CM

## 2020-10-19 DIAGNOSIS — R109 Unspecified abdominal pain: Secondary | ICD-10-CM

## 2020-10-19 LAB — URINALYSIS, COMPLETE (UACMP) WITH MICROSCOPIC
Bacteria, UA: NONE SEEN
Bilirubin Urine: NEGATIVE
Glucose, UA: NEGATIVE mg/dL
Ketones, ur: NEGATIVE mg/dL
Leukocytes,Ua: NEGATIVE
Nitrite: NEGATIVE
Protein, ur: NEGATIVE mg/dL
Specific Gravity, Urine: 1.015 (ref 1.005–1.030)
Squamous Epithelial / HPF: NONE SEEN (ref 0–5)
WBC, UA: NONE SEEN WBC/hpf (ref 0–5)
pH: 6.5 (ref 5.0–8.0)

## 2020-10-19 LAB — PLATELET COUNT: Platelets: 32 10*3/uL — ABNORMAL LOW (ref 150–400)

## 2020-10-19 MED ORDER — DIPHENHYDRAMINE HCL 25 MG PO CAPS
25.0000 mg | ORAL_CAPSULE | Freq: Once | ORAL | Status: AC
  Administered 2020-10-19: 25 mg via ORAL

## 2020-10-19 MED ORDER — DIPHENHYDRAMINE HCL 25 MG PO CAPS
ORAL_CAPSULE | ORAL | Status: AC
  Filled 2020-10-19: qty 1

## 2020-10-19 MED ORDER — HEPARIN SOD (PORK) LOCK FLUSH 100 UNIT/ML IV SOLN
500.0000 [IU] | Freq: Every day | INTRAVENOUS | Status: AC | PRN
  Administered 2020-10-19: 500 [IU]
  Filled 2020-10-19: qty 5

## 2020-10-19 MED ORDER — ACETAMINOPHEN 325 MG PO TABS
650.0000 mg | ORAL_TABLET | Freq: Once | ORAL | Status: AC
  Administered 2020-10-19: 650 mg via ORAL
  Filled 2020-10-19: qty 2

## 2020-10-19 MED ORDER — SODIUM CHLORIDE 0.9% FLUSH
10.0000 mL | INTRAVENOUS | Status: DC | PRN
  Filled 2020-10-19: qty 10

## 2020-10-19 MED ORDER — SODIUM CHLORIDE 0.9% IV SOLUTION
250.0000 mL | Freq: Once | INTRAVENOUS | Status: AC
  Administered 2020-10-19: 250 mL via INTRAVENOUS
  Filled 2020-10-19: qty 250

## 2020-10-19 NOTE — Progress Notes (Unsigned)
Patient reports blurry vision and 3/10 left flank pain. Patient denies dizziness, lightheadedness

## 2020-10-19 NOTE — Progress Notes (Addendum)
[facility]  [address], [address] [place], [place] [address] Phone: [phone]  Fax: [phone]   Clinic Day: 10/19/20  Referring physician: Lequita Asal, MD  Chief Complaint: Carl Oros. is a 45 y.o. male with AML who is seen for assessment on day 13 of cycle #3 HiDAC secondary to pain.  HPI: The patient was last seen in the medical oncology clinic on 10/17/2020. At that time, he was doing well  He noted minor medial left knee pain without trauma.  Exam was unremarkable.  Hematocrit was 32.6, hemoglobin 10.5, MCV 92.6, platelets 6,000, WBC 100 (ANC 0). He received 1 unit of platelets. One hr post platelet count was 25,000.   CBC on 10/18/2020 revealed a hematocrit of 31.8, hemoglobin 10.1, MCV 92.7, platelets 13,000, WBC 100 (ANC 0).  During the interim, he has felt "good."  He has lost 5 pounds.  He is trying to change his diet and eat less meat. His appetite is good. His goal weight is 175 lbs. He reports shortness of breath on exertion and fatigue. He has petechiae on his arms.  He describes left flank pain. He denies any urinary symptoms. He reports on and off testicle pain for several years. His testicles are not swollen. He states he has had a GU exam before and everything was fine.  His vision is blurry after treatment. He uses eye drops for 7 days after treatment. Usually, his vision returns to normal after a few days but this time, it has persisted. He denies any headache, numbness, weakness, balance or coordination problems.  He denies fevers, sweats, headaches, runny nose, sore throat, cough, shortness of breath, chest pain, palpitations, nausea, vomiting, diarrhea, reflux, urinary symptoms, bone or joint symptoms, skin changes, numbness, weakness, balance or coordination problems, and bleeding of any kind.  He sees Dr. Royce Macadamia at [facility] on 10/26/2020.   Past Medical History:  Diagnosis Date  . Acute myeloid leukemia in remission ([place])    . Agranulocytosis secondary to cancer chemotherapy (CODE) ([place])   . Chemotherapy induced neutropenia ([place])   . Pancreatitis   . Pancytopenia ([place])   . Thrombocytopenia ([place])   . Tobacco use disorder     History reviewed. No pertinent surgical history.  Family History  Problem Relation Age of Onset  . Prostate cancer Neg Hx   . Kidney cancer Neg Hx   . Bladder Cancer Neg Hx     Social History:  reports that he quit smoking about 6 months ago. His smoking use included cigarettes. He has a 17.00 pack-year smoking history. He has never used smokeless tobacco. He reports previous alcohol use of about 6.0 standard drinks of alcohol per week. He reports that he does not use drugs. The patient is alone today.  Allergies:  Allergies  Allergen Reactions  . Dapsone     Contraindication - G6PD deficient  . Primaquine     Contraindication - G6PD deficient  . Rasburicase     Contraindication - G6PD deficient    Current Medications: Current Outpatient Medications  Medication Sig Dispense Refill  . fluconazole (DIFLUCAN) 200 MG tablet Take by mouth.    . levofloxacin (LEVAQUIN) 500 MG tablet Take by mouth daily.    . magnesium oxide (MAG-OX) 400 MG tablet Take 800 mg by mouth daily.    . Multiple Vitamin (MULTIVITAMIN WITH MINERALS) TABS tablet Take 1 tablet by mouth daily.    . valACYclovir (VALTREX) 500 MG tablet Take 500 mg by mouth daily.    Marland Kitchen  folic acid (FOLVITE) 1 MG tablet Take 1 tablet (1 mg total) by mouth daily. (Patient not taking: Reported on 10/19/2020)    . HYDROcodone-acetaminophen (NORCO) 5-325 MG tablet Take 1 tablet by mouth every 6 (six) hours as needed for moderate pain. (Patient not taking: No sig reported) 12 tablet 0  . prochlorperazine (COMPAZINE) 10 MG tablet Take by mouth. (Patient not taking: No sig reported)    . thiamine 100 MG tablet Take 1 tablet (100 mg total) by mouth daily. (Patient not taking: Reported on 10/19/2020)     No current facility-administered  medications for this visit.    Review of Systems  Constitutional: Positive for malaise/fatigue and weight loss (5 lbs, intentional, goal weight 175 lbs). Negative for chills, diaphoresis and fever.       Feels "good."  HENT: Negative for congestion, ear discharge, ear pain, hearing loss, nosebleeds, sinus pain, sore throat and tinnitus.   Eyes: Positive for blurred vision (after treatment).  Respiratory: Positive for shortness of breath (on exertion). Negative for cough, hemoptysis and sputum production.   Cardiovascular: Negative for chest pain, palpitations and leg swelling.  Gastrointestinal: Negative for abdominal pain, blood in stool, constipation, diarrhea, heartburn, melena, nausea and vomiting.  Genitourinary: Positive for flank pain (left). Negative for dysuria, frequency, hematuria and urgency.       On and off testicle pain.  Musculoskeletal: Negative for back pain, joint pain, myalgias and neck pain.  Skin: Negative for itching and rash.       Petechiae on arms.  Neurological: Negative for dizziness, tingling, sensory change, weakness and headaches.  Endo/Heme/Allergies: Does not bruise/bleed easily.  Psychiatric/Behavioral: Negative for depression and memory loss. The patient is not nervous/anxious and does not have insomnia.   All other systems reviewed and are negative.  Performance status (ECOG): 1  Vitals Blood pressure 125/68, pulse 77, temperature 97.6 F (36.4 C), temperature source Tympanic, resp. rate 18, weight 200 lb 9.9 oz (91 kg), SpO2 100 %.   Physical Exam Vitals and nursing note reviewed.  Constitutional:      General: He is not in acute distress.    Appearance: He is not diaphoretic.  HENT:     Head: Normocephalic and atraumatic.     Mouth/Throat:     Mouth: Mucous membranes are moist.     Pharynx: Oropharynx is clear.  Eyes:     General: No scleral icterus.    Extraocular Movements: Extraocular movements intact.     Conjunctiva/sclera:  Conjunctivae normal.     Pupils: Pupils are equal, round, and reactive to light.     Comments: Eyes appear tired. No erythema or hazy film.  Cardiovascular:     Rate and Rhythm: Normal rate and regular rhythm.     Heart sounds: Normal heart sounds. No murmur heard.   Pulmonary:     Effort: Pulmonary effort is normal. No respiratory distress.     Breath sounds: Normal breath sounds. No wheezing or rales.  Chest:     Chest wall: No tenderness.  Breasts:     Right: No axillary adenopathy or supraclavicular adenopathy.     Left: No axillary adenopathy or supraclavicular adenopathy.    Abdominal:     General: Bowel sounds are normal. There is no distension.     Palpations: Abdomen is soft. There is no mass.     Tenderness: There is no abdominal tenderness. There is no guarding or rebound.     Comments: Left flank tenderness appears musculoskeletal.  Genitourinary:  Testes: Normal.     Comments: No CVA tenderness.  Testicles without swelling or erythema; non-tender. Musculoskeletal:        General: No swelling or tenderness. Normal range of motion.     Cervical back: Normal range of motion and neck supple.  Lymphadenopathy:     Head:     Right side of head: No preauricular, posterior auricular or occipital adenopathy.     Left side of head: No preauricular, posterior auricular or occipital adenopathy.     Cervical: No cervical adenopathy.     Upper Body:     Right upper body: No supraclavicular or axillary adenopathy.     Left upper body: No supraclavicular or axillary adenopathy.     Lower Body: No right inguinal adenopathy. No left inguinal adenopathy.  Skin:    General: Skin is warm and dry.     Comments: Scattered upper and lower extremity petechiae.  Neurological:     General: No focal deficit present.     Mental Status: He is alert and oriented to person, place, and time. Mental status is at baseline.     Cranial Nerves: No cranial nerve deficit.     Motor: No weakness.      Gait: Gait normal.  Psychiatric:        Behavior: Behavior normal.        Thought Content: Thought content normal.        Judgment: Judgment normal.    Infusion on 10/19/2020  Component Date Value Ref Range Status  . Unit Number 10/19/2020    Final                   FIEPP:[id number] Performed at [facility], [address] [place], [place] [address]   . Blood Component Type 10/19/2020    Final                   Value:PSORALEN TREATED Performed at [facility], [address] [place], [place] [address]   . Unit division 10/19/2020    Final                   Value:00 Performed at [facility], [address] [place], [place] [address]   . Status of Unit 10/19/2020    Final                   Value:ALLOCATED Performed at [facility], [address], [place], [place] [address]   . Transfusion Status 10/19/2020    Final                   Value:OK TO TRANSFUSE Performed at [facility], [address] [place],  [address]   . Blood Product Unit Number 10/19/2020 [id number]   Final  . PRODUCT CODE 10/19/2020 [id number]   Final  . Unit Type and Rh 10/19/2020 5100   Final  . Blood Product Expiration Date 10/19/2020 [id number]   Final  Infusion on 10/19/2020  Component Date Value Ref Range Status  . WBC 10/19/2020 0.2* 4.0 - 10.5 K/uL Final  . RBC 10/19/2020 3.50* 4.22 - 5.81 MIL/uL Final  . Hemoglobin 10/19/2020 10.3* 13.0 - 17.0 g/dL Final  . HCT 10/19/2020 32.0* 39.0 - 52.0 % Final  . MCV 10/19/2020 91.4  80.0 - 100.0 fL Final  . MCH 10/19/2020 29.4  26.0 - 34.0 pg Final  . MCHC 10/19/2020 32.2  30.0 - 36.0 g/dL Final  .  RDW 10/19/2020 16.2* 11.5 - 15.5 % Final  . Platelets 10/19/2020 8* 150 - 400 K/uL Final   Comment: Immature Platelet Fraction may be clinically indicated, consider ordering this additional test [id number]   . nRBC 10/19/2020 0.0  0.0 - 0.2 % Final  . Neutrophils Relative % 10/19/2020  11  % Final   TOO FEW TO COUNT  . Neutro Abs 10/19/2020 0.0* 1.7 - 7.7 K/uL Final   This critical result has verified and been called to [facility] by Woodfin Ganja on 02 23 2022 at 0903, and has been read back.   . Lymphocytes Relative 10/19/2020 68  % Final  . Lymphs Abs 10/19/2020 0.1* 0.7 - 4.0 K/uL Final  . Monocytes Relative 10/19/2020 16  % Final  . Monocytes Absolute 10/19/2020 0.0* 0.1 - 1.0 K/uL Final  . Eosinophils Relative 10/19/2020 5  % Final  . Eosinophils Absolute 10/19/2020 0.0  0.0 - 0.5 K/uL Final  . Basophils Relative 10/19/2020 0  % Final  . Basophils Absolute 10/19/2020 0.0  0.0 - 0.1 K/uL Final  . RBC Morphology 10/19/2020 NO SCHISTOCYTES SEEN   Final  . Smear Review 10/19/2020 PLATELETS APPEAR DECREASED   Final  . Immature Granulocytes 10/19/2020 0  % Final  . Abs Immature Granulocytes 10/19/2020 0.00  0.00 - 0.07 K/uL Final   Performed at [facility], [address] [place], [place] [address]  Appointment on 10/18/2020  Component Date Value Ref Range Status  . WBC 10/18/2020 0.1* 4.0 - 10.5 K/uL Final   Comment: This critical result has verified and been called to TARA DAY by Memory Argue on 02 22 2022 at 1431, and has been read back.  This critical result has verified and been called to TARA DAY by Memory Argue on 02 22 2022 at 1432, and has been read back.    Marland Kitchen RBC 10/18/2020 3.43* 4.22 - 5.81 MIL/uL Final  . Hemoglobin 10/18/2020 10.1* 13.0 - 17.0 g/dL Final  . HCT 10/18/2020 31.8* 39.0 - 52.0 % Final  . MCV 10/18/2020 92.7  80.0 - 100.0 fL Final  . MCH 10/18/2020 29.4  26.0 - 34.0 pg Final  . MCHC 10/18/2020 31.8  30.0 - 36.0 g/dL Final  . RDW 10/18/2020 16.6* 11.5 - 15.5 % Final  . Platelets 10/18/2020 13* 150 - 400 K/uL Final   Comment: Immature Platelet Fraction may be clinically indicated, consider ordering this additional test [id number] THIS CRITICAL RESULT HAS VERIFIED AND BEEN CALLED TO TARA DAY BY CYNTHIA COX ON 02 22 2022 AT  1431, AND HAS BEEN READ BACK.  THIS CRITICAL RESULT HAS VERIFIED AND BEEN CALLED TO TARA DAY BY CYNTHIA COX ON 02 22 2022 AT 43, AND HAS BEEN READ BACK.    Marland Kitchen nRBC 10/18/2020 0.0  0.0 - 0.2 % Final  . Neutrophils Relative % 10/18/2020 0  % Final  . Neutro Abs 10/18/2020 0.0* 1.7 - 7.7 K/uL Final   This critical result has verified and been called to TARA DAY by Woodfin Ganja on 02 22 2022 at 1528, and has been read back.   . Lymphocytes Relative 10/18/2020 86  % Final  . Lymphs Abs 10/18/2020 0.1* 0.7 - 4.0 K/uL Final  . Monocytes Relative 10/18/2020 7  % Final  . Monocytes Absolute 10/18/2020 0.0* 0.1 - 1.0 K/uL Final  . Eosinophils Relative 10/18/2020 7  % Final  . Eosinophils Absolute 10/18/2020 0.0  0.0 - 0.5 K/uL Final  . Basophils Relative 10/18/2020 0  %  Final  . Basophils Absolute 10/18/2020 0.0  0.0 - 0.1 K/uL Final  . Immature Granulocytes 10/18/2020 0  % Final  . Abs Immature Granulocytes 10/18/2020 0.00  0.00 - 0.07 K/uL Final   Performed at [facility], [address] [place], [place] [address]  Infusion on 10/17/2020  Component Date Value Ref Range Status  . Platelets 10/17/2020 25* 150 - 400 K/uL Final   Comment: Immature Platelet Fraction may be clinically indicated, consider ordering this additional test [id number] THIS CRITICAL RESULT HAS VERIFIED AND BEEN CALLED TO DONNIE SAUNDERS(RN) BY SANDRA FAYE WILLIAMSON ON 02 21 2022 AT 1333, AND HAS BEEN READ BACK.  Performed at [facility], [address] [place], [place] [address]   Orders Only on 10/17/2020  Component Date Value Ref Range Status  . Unit Number 10/17/2020 [id number]   Final  . Blood Component Type 10/17/2020 PLTP2 PSORALEN TREATED   Final  . Unit division 10/17/2020 00   Final  . Status of Unit 10/17/2020 ISSUED,FINAL   Final  . Transfusion Status 10/17/2020    Final                   Value:OK TO TRANSFUSE Performed at [facility], [address],  [place], [place] [address]   . ISSUE DATE / TIME 10/17/2020 [id number]   Final  . Blood Product Unit Number 10/17/2020 [id number]   Final  . PRODUCT CODE 10/17/2020 [id number]   Final  . Unit Type and Rh 10/17/2020 5100   Final  . Blood Product Expiration Date 10/17/2020 [id number]   Final  . ISSUING PHYSICIAN 10/17/2020 [id number]^CORCORAN^MELISSA^C   Final  Appointment on 10/17/2020  Component Date Value Ref Range Status  . Blood Bank Specimen 10/17/2020 SAMPLE AVAILABLE FOR TESTING   Final  . Sample Expiration 10/17/2020    Final                   Value:10/20/2020,2359 Performed at [facility], [address] [place], [place] [address]   . WBC 10/17/2020 0.1* 4.0 - 10.5 K/uL Final   Comment: This critical result has verified and been called to [facility] by Memory Argue on 02 21 2022 at 0940, and has been read back.  This critical result has verified and been called to [facility] by Memory Argue on 02 21 2022 at 0941, and has been read back.    Marland Kitchen RBC 10/17/2020 3.52* 4.22 - 5.81 MIL/uL Final  . Hemoglobin 10/17/2020 10.5* 13.0 - 17.0 g/dL Final  . HCT 10/17/2020 32.6* 39.0 - 52.0 % Final  . MCV 10/17/2020 92.6  80.0 - 100.0 fL Final  . MCH 10/17/2020 29.8  26.0 - 34.0 pg Final  . MCHC 10/17/2020 32.2  30.0 - 36.0 g/dL Final  . RDW 10/17/2020 16.5* 11.5 - 15.5 % Final  . Platelets 10/17/2020 6* 150 - 400 K/uL Final   Comment: Immature Platelet Fraction may be clinically indicated, consider ordering this additional test [id number] THIS CRITICAL RESULT HAS VERIFIED AND BEEN CALLED TO SANDRA HOOKER BY CYNTHIA COX ON 02 21 2022 AT 0940, AND HAS BEEN READ BACK.  THIS CRITICAL RESULT HAS VERIFIED AND BEEN CALLED TO SANDRA HOOKER BY CYNTHIA COX ON 02 21 2022 AT 0941, AND HAS BEEN READ BACK.    Marland Kitchen nRBC 10/17/2020 0.0  0.0 - 0.2 % Final  . Neutrophils Relative % 10/17/2020 10  % Final  . Neutro Abs 10/17/2020 0.0* 1.7 - 7.7 K/uL  Final   Comment: RESULT CALLED TO, READ BACK BY  AND VERIFIED WITH: SANDRA HOOKER@[email or web address]  10/17/20/CHC   . Lymphocytes Relative 10/17/2020 70  % Final  . Lymphs Abs 10/17/2020 0.1* 0.7 - 4.0 K/uL Final  . Monocytes Relative 10/17/2020 10  % Final  . Monocytes Absolute 10/17/2020 0.0* 0.1 - 1.0 K/uL Final  . Eosinophils Relative 10/17/2020 10  % Final  . Eosinophils Absolute 10/17/2020 0.0  0.0 - 0.5 K/uL Final  . Basophils Relative 10/17/2020 0  % Final  . Basophils Absolute 10/17/2020 0.0  0.0 - 0.1 K/uL Final  . Smear Review 10/17/2020 PLATELET COUNT CONFIRMED BY SMEAR   Final  . Immature Granulocytes 10/17/2020 0  % Final  . Abs Immature Granulocytes 10/17/2020 0.00  0.00 - 0.07 K/uL Final  . Tear Drop Cells 10/17/2020 PRESENT   Final  . Ovalocytes 10/17/2020 PRESENT   Final   Performed at [facility], [address] [place],  [address]    Assessment:  Carl Jacarri Gesner. 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  He has received 2 courses of induction chemotherapy with daunorubicin and cytarabine.  Bone marrow on 04/01/2020 revealed AML.  Bone marrow on 04/26/2020 was hypocellular (5-10%) with 5% blasts.  Bone marrow on 05/24/2020 revealed 30% cellularity with 10% blasts and focal clustering up to 30-40%.  Bone marrow on 07/05/2020 revealed 50% cellularity with 2% blasts.  MRD was negative by flow cytometry.  He received cytarabine and daunorubicin (7+3) beginning 04/13/2020 and 05/27/2020.  Course has been complicated by fever and neutropenia with mucositis (04/27/2020) and gluteal cleft cellulitis (05/06/2020).  He is day 13 s/p cycle #3 HiDAC consolidation with Udenyca support (began 07/25/2020 - 10/07/2020).  He has required platelet transfusion support: 2 units (08/04/2020 and 08/05/2020) with cycle #1 unit (09/05/2020 with cycle #2.  He has received 1 platelet transfusion following cycle #3.  He has G6PD deficiency.  G6PD was 1.0 (5.3-10.3) on 04/02/2020.  He received the  [place] COVID-19 vaccine on 07/27/2020 and 10/10/2020.  Symptomatically, he notes left flank pain.  Pain appears musculoskeletal.  Exam reveals no CVA tenderness.  He has scattered petechiae.  ANC is 0.  Platelet count is 8,000.  Plan: 1.   Labs today: CBC with diff. 2.   Acute myelogenous leukemia  He is s/p 2 courses of induction daunorubicin and cytarabine.  He is day 13 s/p cycle #2 high-dose ara-C (10/07/2020).   He required 1 platelet transfusion on 10/17/2020.   He will require a platelet transfusion today.  Clinically, he is doing well.   He has mild left flank pain which appears musculoskeletal.  Continue to monitor hematologic effects of therapy 2-3 times weekly s/p chemotherapy.  Blood products are leukoreduced and irradiated.  Transfusion requirement:    2 units PRBCs for hemoglobin < 8   1 unit platelets for platelet count < 10,000, bleeding or invasive procedure  He remains on prophylactic antibiotics.   Levaquin, fluconazole, valacyclovir.  [facility] transplant team continue to investigate possible related donor stem cell transplant.   Family members are being tested.  He will be seen at [place] clinic in follow-up on 10/26/2020.  Discuss symptom management.  He has antiemetics at home to use on a prn bases.  Interventions are adequate.    3.   Leukopenia  WBC 200 with an ANC of 0.  He received Udencya on day 5 s/p chemotherapy.  He remains on prophylactic antibiotics.  Anticipate counts to improve this week.  Continue neutropenic precautions. 4.   Left flank pain  Pain appears musculoskeletal.  Check UA and culture. 5.   Platelet transfusion today. 6.   Draw 1 hour post platelet count today. 7.   RTC daily this week for labs (CBC with diff) and +/- platelet transfusion. 8.   Hold tube on 10/21/2020. 9.   RTC on 10/24/2020 for MD assessment, labs, and +/- transfusion as previously scheduled.  I discussed the assessment and treatment plan with the patient.  The  patient was provided an opportunity to ask questions and all were answered.  The patient agreed with the plan and demonstrated an understanding of the instructions.  The patient was advised to call back if the symptoms worsen or if the condition fails to improve as anticipated.  I provided 22 minutes of face-to-face time during this this encounter and > 50% was spent counseling as documented under my assessment and plan.  An additional 5 minutes were spent reviewing her chart (Epic and Care Everywhere) including notes, labs, and imaging studies and writing orders for transfusion support.    Lequita Asal, MD  10/19/2020, 10:00 AM   I, Mirian Mo Tufford, am acting as Education administrator for [employer]. Mike Gip, MD, PhD.  I, Melissa C. Mike Gip, MD, have reviewed the above documentation for accuracy and completeness, and I agree with the above.

## 2020-10-20 ENCOUNTER — Inpatient Hospital Stay: Payer: No Typology Code available for payment source

## 2020-10-20 DIAGNOSIS — C9200 Acute myeloblastic leukemia, not having achieved remission: Secondary | ICD-10-CM | POA: Diagnosis not present

## 2020-10-20 DIAGNOSIS — C9201 Acute myeloblastic leukemia, in remission: Secondary | ICD-10-CM

## 2020-10-20 LAB — CBC WITH DIFFERENTIAL/PLATELET
Abs Immature Granulocytes: 0 10*3/uL (ref 0.00–0.07)
Abs Immature Granulocytes: 0.01 10*3/uL (ref 0.00–0.07)
Basophils Absolute: 0 10*3/uL (ref 0.0–0.1)
Basophils Absolute: 0 10*3/uL (ref 0.0–0.1)
Basophils Relative: 0 %
Basophils Relative: 2 %
Eosinophils Absolute: 0 10*3/uL (ref 0.0–0.5)
Eosinophils Absolute: 0 10*3/uL (ref 0.0–0.5)
Eosinophils Relative: 5 %
Eosinophils Relative: 3 %
HCT: 32 % — ABNORMAL LOW (ref 39.0–52.0)
HCT: 31.9 % — ABNORMAL LOW (ref 39.0–52.0)
Hemoglobin: 10.3 g/dL — ABNORMAL LOW (ref 13.0–17.0)
Hemoglobin: 10.1 g/dL — ABNORMAL LOW (ref 13.0–17.0)
Immature Granulocytes: 0 %
Immature Granulocytes: 2 %
Lymphocytes Relative: 68 %
Lymphocytes Relative: 26 %
Lymphs Abs: 0.1 10*3/uL — ABNORMAL LOW (ref 0.7–4.0)
Lymphs Abs: 0.2 10*3/uL — ABNORMAL LOW (ref 0.7–4.0)
MCH: 29.4 pg (ref 26.0–34.0)
MCH: 29.1 pg (ref 26.0–34.0)
MCHC: 32.2 g/dL (ref 30.0–36.0)
MCHC: 31.7 g/dL (ref 30.0–36.0)
MCV: 91.4 fL (ref 80.0–100.0)
MCV: 91.9 fL (ref 80.0–100.0)
Monocytes Absolute: 0 10*3/uL — ABNORMAL LOW (ref 0.1–1.0)
Monocytes Absolute: 0.1 10*3/uL (ref 0.1–1.0)
Monocytes Relative: 16 %
Monocytes Relative: 17 %
Neutro Abs: 0 10*3/uL — CL (ref 1.7–7.7)
Neutro Abs: 0.3 10*3/uL — CL (ref 1.7–7.7)
Neutrophils Relative %: 11 %
Neutrophils Relative %: 50 %
Platelets: 8 10*3/uL — CL (ref 150–400)
Platelets: 22 10*3/uL — CL (ref 150–400)
RBC Morphology: NONE SEEN
RBC: 3.5 MIL/uL — ABNORMAL LOW (ref 4.22–5.81)
RBC: 3.47 MIL/uL — ABNORMAL LOW (ref 4.22–5.81)
RDW: 16.2 % — ABNORMAL HIGH (ref 11.5–15.5)
RDW: 16.3 % — ABNORMAL HIGH (ref 11.5–15.5)
Smear Review: DECREASED
WBC: 0.2 10*3/uL — CL (ref 4.0–10.5)
WBC: 0.7 10*3/uL — CL (ref 4.0–10.5)
nRBC: 0 % (ref 0.0–0.2)
nRBC: 0 % (ref 0.0–0.2)

## 2020-10-20 NOTE — Progress Notes (Signed)
[facility]  [address] [place], [place] [address] Phone: [phone]  Fax: [phone]   Clinic Day: 10/24/20  Referring physician: Lequita Asal, MD  Chief Complaint: Carl Huitron. is a 45 y.o. male with AML who is seen for assessment on day 18 of cycle #3 HiDAC.  HPI: The patient was last seen in the medical oncology clinic on 10/19/2020. At that time, he noted left flank pain.  Pain appeared musculoskeletal.  Exam revealed no CVA tenderness.  He had scattered petechiae. Hematocrit was 32.0, hemoglobin 10.3, MCV 91.4, platelets 8,000, WBC 200 (ANC 0). He received 1 unit of platelets. One hour post platelet counts was 32,000.  Urinalysis revealed trace hemoglobin; urine culture was negative.  He saw Beckey Rutter, NP in the symptom management clinic on 10/21/2020 for (no note). Per patient, he had inner thigh and back pain. He was prescribed Flexeril.  The patient was seen in the [facility] ER on 10/22/2020 for concerns of thrombocytopenia. Platelet count was 23,000 (improving). The case was discussed with Dr. Rogue Bussing and it was felt that a platelet transfusion was not necessary.  During the interim, he has been "alright." He denies fevers, infections, nausea, vomiting, diarrhea, bruising, and bleeding. His left flank pain has resolved.  The patient moved some 30 lb boxes this weekend. He carried them from the 3rd floor to the 1st floor. He still has some inner thigh pain, which he thinks is just muscle soreness from exerting himself.  The patient sees Dr. Royce Macadamia on 10/26/2020.   Past Medical History:  Diagnosis Date  . Acute myeloid leukemia in remission ([place])   . Agranulocytosis secondary to cancer chemotherapy (CODE) ([place])   . Chemotherapy induced neutropenia ([place])   . Pancreatitis   . Pancytopenia ([place])   . Thrombocytopenia ([place])   . Tobacco use disorder     History reviewed. No pertinent surgical history.  Family History   Problem Relation Age of Onset  . Prostate cancer Neg Hx   . Kidney cancer Neg Hx   . Bladder Cancer Neg Hx     Social History:  reports that he quit smoking about 6 months ago. His smoking use included cigarettes. He has a 17.00 pack-year smoking history. He has never used smokeless tobacco. He reports previous alcohol use of about 6.0 standard drinks of alcohol per week. He reports that he does not use drugs. The patient is alone today.  Allergies:  Allergies  Allergen Reactions  . Dapsone     Contraindication - G6PD deficient  . Primaquine     Contraindication - G6PD deficient  . Rasburicase     Contraindication - G6PD deficient    Current Medications: Current Outpatient Medications  Medication Sig Dispense Refill  . fluconazole (DIFLUCAN) 200 MG tablet Take 200 mg by mouth daily.    Marland Kitchen levofloxacin (LEVAQUIN) 500 MG tablet Take by mouth daily.    . magnesium oxide (MAG-OX) 400 MG tablet Take 800 mg by mouth daily.    . Multiple Vitamin (MULTIVITAMIN WITH MINERALS) TABS tablet Take 1 tablet by mouth daily.    . valACYclovir (VALTREX) 500 MG tablet Take 500 mg by mouth daily.    . cyclobenzaprine (FLEXERIL) 10 MG tablet Take 1 tablet (10 mg total) by mouth 3 (three) times daily as needed for muscle spasms. 30 tablet 0  . prochlorperazine (COMPAZINE) 10 MG tablet Take by mouth. (Patient not taking: No sig reported)    . thiamine 100 MG tablet Take  1 tablet (100 mg total) by mouth daily. (Patient not taking: No sig reported)     No current facility-administered medications for this visit.   Review of Systems  Constitutional: Negative for chills, diaphoresis, fever, malaise/fatigue and weight loss (up 9 lbs).       Feels "alright."  HENT: Negative for congestion, ear discharge, ear pain, hearing loss, nosebleeds, sinus pain, sore throat and tinnitus.   Eyes: Negative for blurred vision and double vision.  Respiratory: Positive for shortness of breath (on exertion). Negative for  cough, hemoptysis and sputum production.   Cardiovascular: Negative for chest pain, palpitations and leg swelling.  Gastrointestinal: Negative for abdominal pain, blood in stool, constipation, diarrhea, heartburn, melena, nausea and vomiting.  Genitourinary: Negative for dysuria, flank pain, frequency, hematuria and urgency.  Musculoskeletal: Positive for myalgias (inner thigh pain). Negative for back pain, joint pain and neck pain.  Skin: Negative for itching and rash.  Neurological: Negative for dizziness, tingling, sensory change, weakness and headaches.  Endo/Heme/Allergies: Does not bruise/bleed easily.  Psychiatric/Behavioral: Negative for depression and memory loss. The patient is not nervous/anxious and does not have insomnia.   All other systems reviewed and are negative.  Performance status (ECOG): 0-1  Vitals Blood pressure 118/85, pulse 95, temperature (!) 97.2 F (36.2 C), temperature source Tympanic, weight 209 lb 7 oz (95 kg).   Physical Exam Vitals and nursing note reviewed.  Constitutional:      General: He is not in acute distress.    Appearance: He is not diaphoretic.  HENT:     Head: Normocephalic and atraumatic.     Comments: Black cap.    Mouth/Throat:     Mouth: Mucous membranes are moist.     Pharynx: Oropharynx is clear.  Eyes:     General: No scleral icterus.    Extraocular Movements: Extraocular movements intact.     Conjunctiva/sclera: Conjunctivae normal.     Pupils: Pupils are equal, round, and reactive to light.  Cardiovascular:     Rate and Rhythm: Normal rate and regular rhythm.     Heart sounds: Normal heart sounds. No murmur heard.   Pulmonary:     Effort: Pulmonary effort is normal. No respiratory distress.     Breath sounds: Normal breath sounds. No wheezing or rales.  Chest:     Chest wall: No tenderness.  Breasts:     Right: No axillary adenopathy or supraclavicular adenopathy.     Left: No axillary adenopathy or supraclavicular  adenopathy.    Abdominal:     General: Bowel sounds are normal. There is no distension.     Palpations: Abdomen is soft. There is no mass.     Tenderness: There is no abdominal tenderness. There is no guarding or rebound.  Musculoskeletal:        General: No swelling or tenderness. Normal range of motion.     Cervical back: Normal range of motion and neck supple.  Lymphadenopathy:     Head:     Right side of head: No preauricular, posterior auricular or occipital adenopathy.     Left side of head: No preauricular, posterior auricular or occipital adenopathy.     Cervical: No cervical adenopathy.     Upper Body:     Right upper body: No supraclavicular or axillary adenopathy.     Left upper body: No supraclavicular or axillary adenopathy.     Lower Body: No right inguinal adenopathy. No left inguinal adenopathy.  Skin:    General: Skin is  warm and dry.  Neurological:     Mental Status: He is alert and oriented to person, place, and time.  Psychiatric:        Behavior: Behavior normal.        Thought Content: Thought content normal.        Judgment: Judgment normal.    Appointment on 10/24/2020  Component Date Value Ref Range Status  . WBC 10/24/2020 6.1  4.0 - 10.5 K/uL Final  . RBC 10/24/2020 3.38* 4.22 - 5.81 MIL/uL Final  . Hemoglobin 10/24/2020 9.9* 13.0 - 17.0 g/dL Final  . HCT 10/24/2020 31.5* 39.0 - 52.0 % Final  . MCV 10/24/2020 93.2  80.0 - 100.0 fL Final  . MCH 10/24/2020 29.3  26.0 - 34.0 pg Final  . MCHC 10/24/2020 31.4  30.0 - 36.0 g/dL Final  . RDW 10/24/2020 16.8* 11.5 - 15.5 % Final  . Platelets 10/24/2020 PENDING  150 - 400 K/uL Incomplete  . nRBC 10/24/2020 0.8* 0.0 - 0.2 % Final   Performed at [facility], [address], [place], [place] [address]  . Neutrophils Relative % 10/24/2020 PENDING  % Incomplete  . Neutro Abs 10/24/2020 PENDING  1.7 - 7.7 K/uL Incomplete  . Band Neutrophils 10/24/2020 PENDING  % Incomplete  . Lymphocytes Relative  10/24/2020 PENDING  % Incomplete  . Lymphs Abs 10/24/2020 PENDING  0.7 - 4.0 K/uL Incomplete  . Monocytes Relative 10/24/2020 PENDING  % Incomplete  . Monocytes Absolute 10/24/2020 PENDING  0.1 - 1.0 K/uL Incomplete  . Eosinophils Relative 10/24/2020 PENDING  % Incomplete  . Eosinophils Absolute 10/24/2020 PENDING  0.0 - 0.5 K/uL Incomplete  . Basophils Relative 10/24/2020 PENDING  % Incomplete  . Basophils Absolute 10/24/2020 PENDING  0.0 - 0.1 K/uL Incomplete  . WBC Morphology 10/24/2020 PENDING   Incomplete  . RBC Morphology 10/24/2020 PENDING   Incomplete  . Smear Review 10/24/2020 PENDING   Incomplete  . Other 10/24/2020 PENDING  % Incomplete  . nRBC 10/24/2020 PENDING  0 /100 WBC Incomplete  . Metamyelocytes Relative 10/24/2020 PENDING  % Incomplete  . Myelocytes 10/24/2020 PENDING  % Incomplete  . Promyelocytes Relative 10/24/2020 PENDING  % Incomplete  . Blasts 10/24/2020 PENDING  % Incomplete  . Immature Granulocytes 10/24/2020 PENDING  % Incomplete  . Abs Immature Granulocytes 10/24/2020 PENDING  0.00 - 0.07 K/uL Incomplete  Admission on 10/22/2020, Discharged on 10/22/2020  Component Date Value Ref Range Status  . WBC 10/22/2020 8.5  4.0 - 10.5 K/uL Final  . RBC 10/22/2020 3.45* 4.22 - 5.81 MIL/uL Final  . Hemoglobin 10/22/2020 10.2* 13.0 - 17.0 g/dL Final  . HCT 10/22/2020 32.0* 39.0 - 52.0 % Final  . MCV 10/22/2020 92.8  80.0 - 100.0 fL Final  . MCH 10/22/2020 29.6  26.0 - 34.0 pg Final  . MCHC 10/22/2020 31.9  30.0 - 36.0 g/dL Final  . RDW 10/22/2020 16.8* 11.5 - 15.5 % Final  . Platelets 10/22/2020 23* 150 - 400 K/uL Final   Comment: Immature Platelet Fraction may be clinically indicated, consider ordering this additional test [id number] PLATELET COUNT CONFIRMED BY SMEAR THIS CRITICAL RESULT HAS VERIFIED AND BEEN CALLED TO MEGAN JONES BY PAULA MARY FERREE ON 02 26 2022 AT 1636, AND HAS BEEN READ BACK.    Marland Kitchen nRBC 10/22/2020 0.2  0.0 - 0.2 % Final   Performed at  [facility], [address] [place], [place] [address]  . Sodium 10/22/2020 137  135 - 145 mmol/L Final  .  Potassium 10/22/2020 3.8  3.5 - 5.1 mmol/L Final  . Chloride 10/22/2020 101  98 - 111 mmol/L Final  . CO2 10/22/2020 24  22 - 32 mmol/L Final  . Glucose, Bld 10/22/2020 129* 70 - 99 mg/dL Final   Glucose reference range applies only to samples taken after fasting for at least 8 hours.  . BUN 10/22/2020 10  6 - 20 mg/dL Final  . Creatinine, Ser 10/22/2020 1.01  0.61 - 1.24 mg/dL Final  . Calcium 10/22/2020 9.5  8.9 - 10.3 mg/dL Final  . GFR, Estimated 10/22/2020 >60  >60 mL/min Final   Comment: (NOTE) Calculated using the CKD-EPI Creatinine Equation (2021)   . Anion gap 10/22/2020 12  5 - 15 Final   Performed at [facility] [address] [place], [place] [address]  . ABO/RH(D) 10/22/2020 B POS   Final  . Antibody Screen 10/22/2020 NEG   Final  . Sample Expiration 10/22/2020    Final                   Value:10/25/2020,2359 Performed at [facility], [address] [place], [place] [address]     Assessment:  Carl Luccas Towell. 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  He has received 2 courses of induction chemotherapy with daunorubicin and cytarabine.  Bone marrow on 04/01/2020 revealed AML.  Bone marrow on 04/26/2020 was hypocellular (5-10%) with 5% blasts.  Bone marrow on 05/24/2020 revealed 30% cellularity with 10% blasts and focal clustering up to 30-40%.  Bone marrow on 07/05/2020 revealed 50% cellularity with 2% blasts.  MRD was negative by flow cytometry.  He received cytarabine and daunorubicin (7+3) beginning 04/13/2020 and 05/27/2020.  Course has been complicated by fever and neutropenia with mucositis (04/27/2020) and gluteal cleft cellulitis (05/06/2020).  He is day 18 s/p cycle #3 HiDAC consolidation with Udenyca support (began 07/25/2020 - 10/07/2020).  He has required platelet transfusion support:  2 units (08/04/2020 and 08/05/2020) with cycle #1, 1 unit (09/05/2020) with cycle #2, and 2 units with cycle #3 (10/17/2020 and 10/19/2020).  He has G6PD deficiency.  G6PD was 1.0 (5.3-10.3) on 04/02/2020.  He received the [place] COVID-19 vaccine on 07/27/2020 and 10/10/2020.  Symptomatically, he feels "alright."  He denies fevers, infections, nausea, vomiting, diarrhea, bruising, and bleeding. His left flank pain has resolved.  He still has some inner thigh pain from exerting himself.  Exm is unremarkable.  Hemoglobin is 9.9, platelets 65,000, and white count of 6100 and an ANC of 4300  Plan: 1.   Labs today: CBC with differential. 2.   Acute myelogenous leukemia  He is s/p 2 courses of induction daunorubicin and cytarabine.  He is day 18 s/p cycle #3 high-dose ara-C (10/07/2020).    He required platelet transfusion support x2 with this cycle (02/21 and 02/23).   Counts have recovered.  Discuss plans for follow-up in the hematology clinic and bone marrow transplant clinic at [facility].  Transfusion requirement:    2 units PRBCs for hemoglobin < 8   1 unit platelets for platelet count < 10,000, bleeding or invasive procedure  He is on prophylactic antibiotics.   Levaquin, fluconazole, valacyclovir.  He remains unsure about bone marrow transplant.   Family members are still being tested.  Patient to follow-up at [facility] on 10/26/2020  Discuss symptom management.  He has antiemetics at home to use on a prn bases.  Interventions are adequate.     3.  Leukopenia, resolved  WBC 6100 with an Freedom of 4300.  Patient on prophylactic antibiotics. 4.   No transfusion needed today. 5.   Cancel future labs/visits. 6.   Patient to return after cycle #4 HiDAC chemotherapy (patient to call).  I discussed the assessment and treatment plan with the patient.  The patient was provided an opportunity to ask questions and all were answered.  The patient agreed with the plan and demonstrated an understanding of the  instructions.  The patient was advised to call back if the symptoms worsen or if the condition fails to improve as anticipated.   Lequita Asal, MD, PhD 10/24/2020, 5:00 PM  I, Mirian Mo Tufford, am acting as Education administrator for [employer]. Mike Gip, MD, PhD.  I, Melissa C. Mike Gip, MD, have reviewed the above documentation for accuracy and completeness, and I agree with the above.

## 2020-10-21 ENCOUNTER — Other Ambulatory Visit: Payer: Self-pay

## 2020-10-21 ENCOUNTER — Ambulatory Visit: Payer: No Typology Code available for payment source

## 2020-10-21 ENCOUNTER — Encounter: Payer: Self-pay | Admitting: Hematology and Oncology

## 2020-10-21 ENCOUNTER — Inpatient Hospital Stay (HOSPITAL_BASED_OUTPATIENT_CLINIC_OR_DEPARTMENT_OTHER): Payer: No Typology Code available for payment source | Admitting: Nurse Practitioner

## 2020-10-21 ENCOUNTER — Inpatient Hospital Stay: Payer: No Typology Code available for payment source

## 2020-10-21 ENCOUNTER — Other Ambulatory Visit: Payer: No Typology Code available for payment source

## 2020-10-21 VITALS — BP 124/86 | HR 84 | Temp 98.8°F | Resp 18

## 2020-10-21 DIAGNOSIS — M545 Low back pain, unspecified: Secondary | ICD-10-CM

## 2020-10-21 DIAGNOSIS — C9201 Acute myeloblastic leukemia, in remission: Secondary | ICD-10-CM | POA: Diagnosis not present

## 2020-10-21 DIAGNOSIS — D696 Thrombocytopenia, unspecified: Secondary | ICD-10-CM

## 2020-10-21 LAB — CBC WITH DIFFERENTIAL/PLATELET
Abs Immature Granulocytes: 0.3 10*3/uL — ABNORMAL HIGH (ref 0.00–0.07)
Band Neutrophils: 15 %
Basophils Absolute: 0 10*3/uL (ref 0.0–0.1)
Basophils Relative: 0 %
Eosinophils Absolute: 0 10*3/uL (ref 0.0–0.5)
Eosinophils Relative: 0 %
HCT: 32.4 % — ABNORMAL LOW (ref 39.0–52.0)
Hemoglobin: 10.4 g/dL — ABNORMAL LOW (ref 13.0–17.0)
Lymphocytes Relative: 17 %
Lymphs Abs: 0.6 10*3/uL — ABNORMAL LOW (ref 0.7–4.0)
MCH: 29.8 pg (ref 26.0–34.0)
MCHC: 32.1 g/dL (ref 30.0–36.0)
MCV: 92.8 fL (ref 80.0–100.0)
Metamyelocytes Relative: 3 %
Monocytes Absolute: 0.4 10*3/uL (ref 0.1–1.0)
Monocytes Relative: 12 %
Myelocytes: 6 %
Neutro Abs: 2.3 10*3/uL (ref 1.7–7.7)
Neutrophils Relative %: 47 %
Platelets: 17 10*3/uL — CL (ref 150–400)
RBC: 3.49 MIL/uL — ABNORMAL LOW (ref 4.22–5.81)
RDW: 16.2 % — ABNORMAL HIGH (ref 11.5–15.5)
Smear Review: NORMAL
WBC: 3.7 10*3/uL — ABNORMAL LOW (ref 4.0–10.5)
nRBC: 0 % (ref 0.0–0.2)

## 2020-10-21 LAB — URINE CULTURE: Culture: NO GROWTH

## 2020-10-21 LAB — SAMPLE TO BLOOD BANK

## 2020-10-21 MED ORDER — CYCLOBENZAPRINE HCL 10 MG PO TABS: 10.0000 mg | ORAL_TABLET | Freq: Three times a day (TID) | ORAL | 0 refills | Status: DC | PRN

## 2020-10-21 NOTE — Progress Notes (Signed)
Symptom Management [place]  [phone] [phone]  Patient Care Team: Lequita Asal, MD as PCP - General (Hematology and Oncology)   Name of the patient: Carl [name]  [id number]  1976-03-18   Date of visit: 10/21/20  Diagnosis- AML  Chief complaint/ Reason for visit- pain  Heme/Onc history:  Oncology History   No history exists.    Interval history-patient is 45 year old male with above history of AML who presents for muscle aches of low back. Pain started this morning. Describes as mild to moderate. He would prefer to avoid narcotics. No abnormal bleeding or bruising. No urinary complaints, reports good appetite and no dizziness or weakness. No further specific complaints today.   Review of systems- Review of Systems  Constitutional: Negative for chills, fever, malaise/fatigue and weight loss.  HENT: Negative for hearing loss, nosebleeds, sore throat and tinnitus.   Eyes: Negative for blurred vision and double vision.  Respiratory: Negative for cough, hemoptysis, shortness of breath and wheezing.   Cardiovascular: Negative for chest pain, palpitations and leg swelling.  Gastrointestinal: Negative for abdominal pain, blood in stool, constipation, diarrhea, melena, nausea and vomiting.  Genitourinary: Negative for dysuria and urgency.  Musculoskeletal: Positive for back pain. Negative for falls, joint pain, myalgias and neck pain.  Skin: Negative for itching and rash.  Neurological: Negative for dizziness, tingling, sensory change, loss of consciousness, weakness and headaches.  Endo/Heme/Allergies: Negative for environmental allergies. Bruises/bleeds easily.  Psychiatric/Behavioral: Negative for depression. The patient is not nervous/anxious and does not have insomnia.      Allergies  Allergen Reactions  . Dapsone     Contraindication - G6PD deficient  . Primaquine     Contraindication - G6PD deficient  .  Rasburicase     Contraindication - G6PD deficient    Past Medical History:  Diagnosis Date  . Acute myeloid leukemia in remission ([place])   . Agranulocytosis secondary to cancer chemotherapy (CODE) ([place])   . Chemotherapy induced neutropenia ([place])   . Pancreatitis   . Pancytopenia ([place])   . Thrombocytopenia ([place])   . Tobacco use disorder     No past surgical history on file.  Social History   Socioeconomic History  . Marital status: Single    Spouse name: Not on file  . Number of children: Not on file  . Years of education: Not on file  . Highest education level: Not on file  Occupational History  . Not on file  Tobacco Use  . Smoking status: Former Smoker    Packs/day: 1.00    Years: 17.00    Pack years: 17.00    Types: Cigarettes    Quit date: 03/27/2020    Years since quitting: 0.5  . Smokeless tobacco: Never Used  Vaping Use  . Vaping Use: Never used  Substance and Sexual Activity  . Alcohol use: Not Currently    Alcohol/week: 6.0 standard drinks    Types: 6 Cans of beer per week    Comment: 6-24oz beers on weekly bases  . Drug use: Never    Types: Marijuana  . Sexual activity: Not on file  Other Topics Concern  . Not on file  Social History Narrative  . Not on file   Social Determinants of Health   Financial Resource Strain: Not on file  Food Insecurity: Not on file  Transportation Needs: Not on file  Physical Activity: Not on file  Stress: Not on file  Social Connections: Not on file  Intimate  Partner Violence: Not on file    Family History  Problem Relation Age of Onset  . Prostate cancer Neg Hx   . Kidney cancer Neg Hx   . Bladder Cancer Neg Hx      Current Outpatient Medications:  .  fluconazole (DIFLUCAN) 200 MG tablet, Take by mouth., Disp: , Rfl:  .  folic acid (FOLVITE) 1 MG tablet, Take 1 tablet (1 mg total) by mouth daily. (Patient not taking: Reported on 10/19/2020), Disp: , Rfl:  .  HYDROcodone-acetaminophen (NORCO) 5-325 MG tablet,  Take 1 tablet by mouth every 6 (six) hours as needed for moderate pain. (Patient not taking: No sig reported), Disp: 12 tablet, Rfl: 0 .  levofloxacin (LEVAQUIN) 500 MG tablet, Take by mouth daily., Disp: , Rfl:  .  magnesium oxide (MAG-OX) 400 MG tablet, Take 800 mg by mouth daily., Disp: , Rfl:  .  Multiple Vitamin (MULTIVITAMIN WITH MINERALS) TABS tablet, Take 1 tablet by mouth daily., Disp: , Rfl:  .  prochlorperazine (COMPAZINE) 10 MG tablet, Take by mouth. (Patient not taking: No sig reported), Disp: , Rfl:  .  thiamine 100 MG tablet, Take 1 tablet (100 mg total) by mouth daily. (Patient not taking: Reported on 10/19/2020), Disp: , Rfl:  .  valACYclovir (VALTREX) 500 MG tablet, Take 500 mg by mouth daily., Disp: , Rfl:   Physical exam:  Vitals:   10/21/20 1006  BP: 124/86  Pulse: 84  Resp: 18  Temp: 98.8 F (37.1 C)  TempSrc: Tympanic  SpO2: 100%   Physical Exam Constitutional:      General: He is not in acute distress. Musculoskeletal:        General: No swelling, tenderness, deformity or signs of injury.     Right lower leg: No edema.     Left lower leg: No edema.  Skin:    Findings: No bruising or rash.  Neurological:     Mental Status: He is alert and oriented to person, place, and time.  Psychiatric:        Mood and Affect: Mood normal.        Behavior: Behavior normal.      CMP Latest Ref Rng & Units 08/01/2020  Glucose 70 - 99 mg/dL 104(H)  BUN 6 - 20 mg/dL 14  Creatinine 0.61 - 1.24 mg/dL 0.98  Sodium 135 - 145 mmol/L 134(L)  Potassium 3.5 - 5.1 mmol/L 4.2  Chloride 98 - 111 mmol/L 100  CO2 22 - 32 mmol/L 25  Calcium 8.9 - 10.3 mg/dL 9.3  Total Protein 6.5 - 8.1 g/dL 7.5  Total Bilirubin 0.3 - 1.2 mg/dL 1.2  Alkaline Phos 38 - 126 U/L 101  AST 15 - 41 U/L 24  ALT 0 - 44 U/L 65(H)   CBC Latest Ref Rng & Units 10/20/2020  WBC 4.0 - 10.5 K/uL 0.7(LL)  Hemoglobin 13.0 - 17.0 g/dL 10.1(L)  Hematocrit 39.0 - 52.0 % 31.9(L)  Platelets 150 - 400 K/uL 22(LL)     No images are attached to the encounter.  No results found.  Assessment and plan- Patient is a 45 y.o. male diagnosed with AML who presents to [facility] for back pain. Etiology unclear but benign appearing, suspect msk etiology. Will start on flexeril for comfort.  Thrombocytopenia- plt 22 today. Asymptomatic. Discussed Dr. Kem Parkinson recommendation to have labs drawn over the weekend with possible platelet transfusion. Patient unable to travel due to transportation issues and cost. Will reach out to see if we can assist patient  with transportation in the future. If he is able to coordinate transportation, will set up for him to have cbc drawn over the weekend.  Return to clinic if symptoms odn't improve or worsen in the interim. Otherwise follow up with DR. Corcoran as scheduled.    Visit Diagnosis 1. Bilateral low back pain without sciatica, unspecified chronicity   2. Thrombocytopenia ([place])     Patient expressed understanding and was in agreement with this plan. He also understands that He can call clinic at any time with any questions, concerns, or complaints.   Thank you for allowing me to participate in the care of this very pleasant patient.   Beckey Rutter, DNP, AGNP-C [facility] [place]  CC:

## 2020-10-22 ENCOUNTER — Telehealth: Payer: Self-pay | Admitting: Hematology and Oncology

## 2020-10-22 ENCOUNTER — Other Ambulatory Visit: Payer: Self-pay

## 2020-10-22 ENCOUNTER — Other Ambulatory Visit: Payer: Self-pay | Admitting: Hematology and Oncology

## 2020-10-22 ENCOUNTER — Encounter: Payer: Self-pay | Admitting: Emergency Medicine

## 2020-10-22 ENCOUNTER — Emergency Department
Admission: EM | Admit: 2020-10-22 | Discharge: 2020-10-22 | Disposition: A | Discharge status: Other Acute Inpatient Hospital | Payer: Self-pay

## 2020-10-22 ENCOUNTER — Emergency Department
Admission: EM | Admit: 2020-10-22 | Discharge: 2020-10-22 | Disposition: A | Discharge status: Home / Self Care | Payer: No Typology Code available for payment source | Attending: Emergency Medicine | Admitting: Emergency Medicine

## 2020-10-22 DIAGNOSIS — D696 Thrombocytopenia, unspecified: Secondary | ICD-10-CM | POA: Insufficient documentation

## 2020-10-22 DIAGNOSIS — Z87891 Personal history of nicotine dependence: Secondary | ICD-10-CM | POA: Diagnosis not present

## 2020-10-22 DIAGNOSIS — C9201 Acute myeloblastic leukemia, in remission: Secondary | ICD-10-CM

## 2020-10-22 LAB — BASIC METABOLIC PANEL
Anion gap: 12 (ref 5–15)
BUN: 10 mg/dL (ref 6–20)
CO2: 24 mmol/L (ref 22–32)
Calcium: 9.5 mg/dL (ref 8.9–10.3)
Chloride: 101 mmol/L (ref 98–111)
Creatinine, Ser: 1.01 mg/dL (ref 0.61–1.24)
GFR, Estimated: >60 mL/min (ref >60)
Glucose, Bld: 129 mg/dL — ABNORMAL HIGH (ref 70–99)
Potassium: 3.8 mmol/L (ref 3.5–5.1)
Sodium: 137 mmol/L (ref 135–145)

## 2020-10-22 LAB — CBC
HCT: 32 % — ABNORMAL LOW (ref 39.0–52.0)
Hemoglobin: 10.2 g/dL — ABNORMAL LOW (ref 13.0–17.0)
MCH: 29.6 pg (ref 26.0–34.0)
MCHC: 31.9 g/dL (ref 30.0–36.0)
MCV: 92.8 fL (ref 80.0–100.0)
Platelets: 23 10*3/uL — CL (ref 150–400)
RBC: 3.45 MIL/uL — ABNORMAL LOW (ref 4.22–5.81)
RDW: 16.8 % — ABNORMAL HIGH (ref 11.5–15.5)
WBC: 8.5 10*3/uL (ref 4.0–10.5)
nRBC: 0.2 % (ref 0.0–0.2)

## 2020-10-22 LAB — BPAM PLATELET PHERESIS
Blood Product Expiration Date: 202202262359
ISSUE DATE / TIME: 202202231157
Unit Type and Rh: 5100

## 2020-10-22 LAB — PREPARE PLATELET PHERESIS: Unit division: 0

## 2020-10-22 LAB — TYPE AND SCREEN
ABO/RH(D): B POS
Antibody Screen: NEGATIVE

## 2020-10-22 NOTE — Discharge Instructions (Addendum)
Please seek medical attention for any high fevers, chest pain, shortness of breath, change in behavior, persistent vomiting, bloody stool or any other new or concerning symptoms.  

## 2020-10-22 NOTE — ED Provider Notes (Signed)
[facility] Emergency Department Provider Note   ____________________________________________   I have reviewed the triage vital signs and the nursing notes.   HISTORY  Chief Complaint Low platelet  History limited by: Not Limited   HPI Carl [name]. is a 45 y.o. male who presents to the emergency department today at the advice of his oncologist because of concerns for low platelet count.  Patient had blood work done through his oncologist yesterday.  His platelets were 17.  They have been coming down.  Because of this the oncologist wanted the patient to come to the emergency department.  He has required platelet transfusions in the past.  Patient denies any bloody or black stool.  Denies any bloody urine.  He has not fallen or hit his head.  Records reviewed. 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 Agranulocytosis secondary to cancer chemotherapy (CODE) ([place])   . Chemotherapy induced neutropenia ([place])   . Pancreatitis   . Pancytopenia ([place])   . Thrombocytopenia ([place])   . Tobacco use disorder     Patient Active Problem List   Diagnosis Date Noted  . Chemotherapy-induced neutropenia ([place]) 08/05/2020  . Thrombocytopenia ([place]) 08/03/2020  . Chest wall pain 07/20/2020  . Port-A-Cath in place 07/20/2020  . 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 Agranulocytosis secondary to cancer chemotherapy (CODE) ([place]) 05/13/2020  . G6PD deficiency 04/04/2020  . Tobacco use disorder 04/04/2020  . History of alcohol use disorder 04/01/2020  . Neutropenic fever ([place]) 03/29/2020  . Pancytopenia ([place]) 03/29/2020  . Pancreatitis 03/29/2020  . Flank pain 11/23/2019  . Low back pain 11/23/2019  . Encounter to establish care with new doctor 11/23/2019    History reviewed. No pertinent surgical history.  Prior to Admission medications    Medication Sig Start Date End Date Taking? Authorizing Provider  cyclobenzaprine (FLEXERIL) 10 MG tablet Take 1 tablet (10 mg total) by mouth 3 (three) times daily as needed for muscle spasms. 10/21/20   Verlon Au, NP  fluconazole (DIFLUCAN) 200 MG tablet Take 200 mg by mouth daily. 10/10/20 11/09/20  [provider]  levofloxacin (LEVAQUIN) 500 MG tablet Take by mouth daily. 07/28/20   [provider]  magnesium oxide (MAG-OX) 400 MG tablet Take 800 mg by mouth daily. 06/24/20 06/24/21  [provider]  Multiple Vitamin (MULTIVITAMIN WITH MINERALS) TABS tablet Take 1 tablet by mouth daily. 04/02/20   Alma Friendly, MD  prochlorperazine (COMPAZINE) 10 MG tablet Take by mouth. Patient not taking: No sig reported 07/28/20   [provider]  thiamine 100 MG tablet Take 1 tablet (100 mg total) by mouth daily. Patient not taking: No sig reported 04/02/20   Alma Friendly, MD  valACYclovir (VALTREX) 500 MG tablet Take 500 mg by mouth daily. 06/24/20   [provider]    Allergies Dapsone, Primaquine, and Rasburicase  Family History  Problem Relation Age of Onset  . Prostate cancer Neg Hx   . Kidney cancer Neg Hx   . Bladder Cancer Neg Hx     Social History Social History   Tobacco Use  . Smoking status: Former Smoker    Packs/day: 1.00    Years: 17.00    Pack years: 17.00    Types: Cigarettes    Quit date: 03/27/2020    Years since quitting: 0.5  . Smokeless tobacco:  Never Used  Vaping Use  . Vaping Use: Never used  Substance Use Topics  . Alcohol use: Not Currently    Alcohol/week: 6.0 standard drinks    Types: 6 Cans of beer per week    Comment: 6-24oz beers on weekly bases  . Drug use: Never    Types: Marijuana    Review of Systems Constitutional: No fever/chills Eyes: No visual changes. ENT: No sore throat. Cardiovascular: Denies chest pain. Respiratory: Denies shortness of breath. Gastrointestinal: No abdominal  pain.  No nausea, no vomiting.  No diarrhea.   Genitourinary: Negative for dysuria. Musculoskeletal: Negative for back pain. Skin: Negative for rash. Neurological: Negative for headaches, focal weakness or numbness.  ____________________________________________   PHYSICAL EXAM:  VITAL SIGNS: ED Triage Vitals  Enc Vitals Group     BP 10/22/20 1558 122/77     Pulse Rate 10/22/20 1558 99     Resp 10/22/20 1558 20     Temp 10/22/20 1558 98.5 F (36.9 C)     Temp Source 10/22/20 1558 Oral     SpO2 10/22/20 1558 98 %     Weight 10/22/20 1551 197 lb (89.4 kg)     Height 10/22/20 1551 5\' 9"  (1.753 m)     Head Circumference --      Peak Flow --      Pain Score 10/22/20 1551 0    Constitutional: Alert and oriented.  Eyes: Conjunctivae are normal.  ENT      Head: Normocephalic and atraumatic.      Nose: No congestion/rhinnorhea.      Mouth/Throat: Mucous membranes are moist.      Neck: No stridor. Hematological/Lymphatic/Immunilogical: No cervical lymphadenopathy. Cardiovascular: Normal rate, regular rhythm.  No murmurs, rubs, or gallops.  Respiratory: Normal respiratory effort without tachypnea nor retractions. Breath sounds are clear and equal bilaterally. No wheezes/rales/rhonchi. Gastrointestinal: Soft and non tender. No rebound. No guarding.  Genitourinary: Deferred Musculoskeletal: Normal range of motion in all extremities. No lower extremity edema. Neurologic:  Normal speech and language. No gross focal neurologic deficits are appreciated.  Skin:  Skin is warm, dry and intact. No rash noted. Psychiatric: Mood and affect are normal. Speech and behavior are normal. Patient exhibits appropriate insight and judgment.  ____________________________________________    LABS (pertinent positives/negatives)  BMP wnl except glu 129 CBC wbc 8.5, hgb 10.2, plt 23  ____________________________________________   EKG  None  ____________________________________________     RADIOLOGY  None  ____________________________________________   PROCEDURES  Procedures  ____________________________________________   INITIAL IMPRESSION / ASSESSMENT AND PLAN / ED COURSE  Pertinent labs & imaging results that were available during my care of the patient were reviewed by me and considered in my medical decision making (see chart for details).   Patient presented to the emergency department today because of concerns for low platelet count.  Blood work today does show improving platelet count.  Patient not complaining of any bleeding.  Discussed with Dr. Rogue Bussing with oncology.  At this point did not feel any platelet transfusion is necessary.  I did discuss return precautions.  ____________________________________________   FINAL CLINICAL IMPRESSION(S) / ED DIAGNOSES  Final diagnoses:  Thrombocytopenia ([place])     Note: This dictation was prepared with Dragon dictation. Any transcriptional errors that result from this process are unintentional     Nance Pear, MD 10/22/20 [id number]

## 2020-10-22 NOTE — ED Notes (Signed)
ED Provider at bedside. 

## 2020-10-22 NOTE — ED Triage Notes (Addendum)
Pt via POV from home. Pt stated that his PCP called him saying he needed to come into the ED for platelet infusion. Pt's platelet count is low 17,000, pt's blood was drawn. Pt is a chemo patient, and receives chemo for Leukemia. Denies any weakness, Denies pain. Pt is A&Ox4 and NAD.

## 2020-10-22 NOTE — Telephone Encounter (Signed)
Re:  Labs  Called patient today about labs which were to be drawn today following chemotherapy for AML.  Yesterday, platelet count was 17,000 and falling.    He has received 2 platelet transfusions with this cycle.  He stated that he did not have a ride.  Family lives out of town.  Brother is working.  He will try to get a ride.  If any excess bruising or bleeding, he is to call 911.   Lequita Asal, MD

## 2020-10-24 ENCOUNTER — Encounter: Payer: Self-pay | Admitting: Hematology and Oncology

## 2020-10-24 ENCOUNTER — Inpatient Hospital Stay: Payer: No Typology Code available for payment source

## 2020-10-24 ENCOUNTER — Inpatient Hospital Stay (HOSPITAL_BASED_OUTPATIENT_CLINIC_OR_DEPARTMENT_OTHER): Payer: No Typology Code available for payment source | Admitting: Hematology and Oncology

## 2020-10-24 ENCOUNTER — Other Ambulatory Visit: Payer: Self-pay

## 2020-10-24 VITALS — BP 118/85 | HR 95 | Temp 97.2°F | Wt 209.4 lb

## 2020-10-24 DIAGNOSIS — Z7189 Other specified counseling: Secondary | ICD-10-CM | POA: Diagnosis not present

## 2020-10-24 DIAGNOSIS — C9201 Acute myeloblastic leukemia, in remission: Secondary | ICD-10-CM | POA: Diagnosis not present

## 2020-10-24 DIAGNOSIS — C9200 Acute myeloblastic leukemia, not having achieved remission: Secondary | ICD-10-CM | POA: Diagnosis not present

## 2020-10-24 LAB — CBC WITH DIFFERENTIAL/PLATELET
Abs Immature Granulocytes: 0.6 10*3/uL — ABNORMAL HIGH (ref 0.00–0.07)
Basophils Absolute: 0.1 10*3/uL (ref 0.0–0.1)
Basophils Relative: 1 %
Eosinophils Absolute: 0 10*3/uL (ref 0.0–0.5)
Eosinophils Relative: 0 %
HCT: 31.5 % — ABNORMAL LOW (ref 39.0–52.0)
Hemoglobin: 9.9 g/dL — ABNORMAL LOW (ref 13.0–17.0)
Immature Granulocytes: 10 %
Lymphocytes Relative: 8 %
Lymphs Abs: 0.5 10*3/uL — ABNORMAL LOW (ref 0.7–4.0)
MCH: 29.3 pg (ref 26.0–34.0)
MCHC: 31.4 g/dL (ref 30.0–36.0)
MCV: 93.2 fL (ref 80.0–100.0)
Monocytes Absolute: 0.7 10*3/uL (ref 0.1–1.0)
Monocytes Relative: 11 %
Neutro Abs: 4.3 10*3/uL (ref 1.7–7.7)
Neutrophils Relative %: 70 %
Platelets: 65 10*3/uL — ABNORMAL LOW (ref 150–400)
RBC: 3.38 MIL/uL — ABNORMAL LOW (ref 4.22–5.81)
RDW: 16.8 % — ABNORMAL HIGH (ref 11.5–15.5)
Smear Review: NORMAL
WBC: 6.1 10*3/uL (ref 4.0–10.5)
nRBC: 0.8 % — ABNORMAL HIGH (ref 0.0–0.2)

## 2020-10-24 LAB — SAMPLE TO BLOOD BANK

## 2020-10-26 ENCOUNTER — Inpatient Hospital Stay: Payer: No Typology Code available for payment source

## 2020-10-26 ENCOUNTER — Ambulatory Visit: Payer: No Typology Code available for payment source

## 2020-10-27 ENCOUNTER — Inpatient Hospital Stay: Payer: No Typology Code available for payment source

## 2020-10-28 ENCOUNTER — Other Ambulatory Visit: Payer: No Typology Code available for payment source

## 2020-10-28 ENCOUNTER — Ambulatory Visit: Payer: No Typology Code available for payment source

## 2020-10-31 ENCOUNTER — Inpatient Hospital Stay: Payer: No Typology Code available for payment source

## 2020-11-01 ENCOUNTER — Telehealth: Payer: Self-pay | Admitting: *Deleted

## 2020-11-01 NOTE — Telephone Encounter (Signed)
[name] from [facility] called needing to coordinate care for this patient who is scheduled for [facility] treatment on 3/11. Starting 3/15 he is asking that we set up for Pegfilgastrim , weekly labs and transfusion support times 4 weeks. Please arrange and let him know

## 2020-11-01 NOTE — Telephone Encounter (Signed)
  Please schedule:  RTC on 11/08/2020 for MD assess, labs (CBC with diff) and Neulasta.  M

## 2020-11-02 NOTE — Telephone Encounter (Signed)
11/02/2020 Spoke w/ pt and informed him of appts for lab/MD/Neulasta on 11/08/20, along with twice weekly labs/ +/- transfusions for the next 4 weeks. Pt confirmed appt and asked that I give him a print out on Monday and make sure his other appts work with his schedule. Will have print out ready for him at [facility] appt  SRW

## 2020-11-04 ENCOUNTER — Other Ambulatory Visit: Payer: Self-pay

## 2020-11-07 NOTE — Progress Notes (Signed)
[facility]  [address], [address] [place], [place] [address] Phone: [phone]  Fax: [phone]   Clinic Day: 11/08/20  Referring physician: Lequita Asal, MD  Chief Complaint: Carl [name]. is a 45 y.o. male with AML who is seen for assessment on day 5 s/p cycle #4 HiDAC for Neulasta.  HPI: The patient was last seen in the medical oncology clinic on 10/24/2020. At that time, he felt "alright."  He denied fevers, infections, nausea, vomiting, diarrhea, bruising, and bleeding. His left flank pain had resolved.  He still had some inner thigh pain from exerting himself.  Exam was unremarkable. Hematocrit was 31.5, hemoglobin 9.9, MCV 93.2, platelets 65,000, WBC 6,100.  The patient was admitted to [facility] from 11/04/2020 - 11/07/2020 for cycle #4 HiDAC.  Repeat bone marrow is tentatively scheduled for 12/05/2020.  During the interim, he has been "pretty ok." He finished his last dose of chemotherapy yesterday morning around 11 am. Treatment went well. He states it went the same as the other cycles. He had a little bit of nausea. He denies fever, pain, shortness of breath, and palpitations.  He is taking oral magnesium.   Past Medical History:  Diagnosis Date  . 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 Agranulocytosis secondary to cancer chemotherapy (CODE) ([place])   . Chemotherapy induced neutropenia ([place])   . Pancreatitis   . Pancytopenia ([place])   . Thrombocytopenia ([place])   . Tobacco use disorder     No past surgical history on file.  Family History  Problem Relation Age of Onset  . Prostate cancer Neg Hx   . Kidney cancer Neg Hx   . Bladder Cancer Neg Hx     Social History:  reports that he quit smoking about 7 months ago. His smoking use included cigarettes. He has a 17.00 pack-year smoking history. He has never used smokeless tobacco. He reports previous alcohol use of about 6.0 standard drinks of alcohol per week. He reports that he  does not use drugs. The patient is alone today.  Allergies:  Allergies  Allergen Reactions  . Dapsone     Contraindication - G6PD deficient  . Primaquine     Contraindication - G6PD deficient  . Rasburicase     Contraindication - G6PD deficient    Current Medications: Current Outpatient Medications  Medication Sig Dispense Refill  . cyanocobalamin 1000 MCG tablet Take 1,000 mcg by mouth daily.    . cyclobenzaprine (FLEXERIL) 10 MG tablet Take 1 tablet (10 mg total) by mouth 3 (three) times daily as needed for muscle spasms. 30 tablet 0  . fluconazole (DIFLUCAN) 200 MG tablet Take 200 mg by mouth daily.    Marland Kitchen levofloxacin (LEVAQUIN) 500 MG tablet Take by mouth daily.    . magnesium oxide (MAG-OX) 400 MG tablet Take 800 mg by mouth daily.    . Multiple Vitamin (MULTIVITAMIN WITH MINERALS) TABS tablet Take 1 tablet by mouth daily.    . prednisoLONE acetate (PRED FORTE) 1 % ophthalmic suspension Administer 2 drops to both eyes every six (6) hours for 7 days.    . valACYclovir (VALTREX) 500 MG tablet Take 500 mg by mouth daily.    . prochlorperazine (COMPAZINE) 10 MG tablet Take by mouth. (Patient not taking: No sig reported)     No current facility-administered medications for this visit.    Review of Systems  Constitutional: Negative for chills, diaphoresis, fever, malaise/fatigue and weight loss (up 2 lbs).  Feels "pretty ok."  HENT: Negative for congestion, ear discharge, ear pain, hearing loss, nosebleeds, sinus pain, sore throat and tinnitus.   Eyes: Negative for blurred vision and double vision.  Respiratory: Negative for cough, hemoptysis, sputum production and shortness of breath.   Cardiovascular: Negative for chest pain, palpitations and leg swelling.  Gastrointestinal: Negative for abdominal pain, blood in stool, constipation, diarrhea, heartburn, melena, nausea (mild s/p chemo, resolved) and vomiting.  Genitourinary: Negative for dysuria, flank pain, frequency,  hematuria and urgency.  Musculoskeletal: Negative for back pain, joint pain, myalgias and neck pain.  Skin: Negative for itching and rash.  Neurological: Negative for dizziness, tingling, sensory change, weakness and headaches.  Endo/Heme/Allergies: Does not bruise/bleed easily.  Psychiatric/Behavioral: Negative for depression and memory loss. The patient is not nervous/anxious and does not have insomnia.   All other systems reviewed and are negative.  Performance status (ECOG): 0  Vitals Blood pressure 121/71, pulse 63, temperature (!) 96.2 F (35.7 C), temperature source Tympanic, resp. rate 18, weight 211 lb 10.3 oz (96 kg), SpO2 100 %.   Physical Exam Vitals and nursing note reviewed.  Constitutional:      General: He is not in acute distress.    Appearance: He is not diaphoretic.  HENT:     Head: Normocephalic and atraumatic.     Comments: Wearing a cap.    Mouth/Throat:     Mouth: Mucous membranes are moist.     Pharynx: Oropharynx is clear.  Eyes:     General: No scleral icterus.    Extraocular Movements: Extraocular movements intact.     Conjunctiva/sclera: Conjunctivae normal.     Pupils: Pupils are equal, round, and reactive to light.  Cardiovascular:     Rate and Rhythm: Normal rate and regular rhythm.     Heart sounds: Normal heart sounds. No murmur heard.   Pulmonary:     Effort: Pulmonary effort is normal. No respiratory distress.     Breath sounds: Normal breath sounds. No wheezing or rales.  Chest:     Chest wall: No tenderness.  Breasts:     Right: No axillary adenopathy or supraclavicular adenopathy.     Left: No axillary adenopathy or supraclavicular adenopathy.    Abdominal:     General: Bowel sounds are normal. There is no distension.     Palpations: Abdomen is soft. There is no mass.     Tenderness: There is no abdominal tenderness. There is no guarding or rebound.  Musculoskeletal:        General: No swelling or tenderness. Normal range of  motion.     Cervical back: Normal range of motion and neck supple.  Lymphadenopathy:     Head:     Right side of head: No preauricular, posterior auricular or occipital adenopathy.     Left side of head: No preauricular, posterior auricular or occipital adenopathy.     Cervical: No cervical adenopathy.     Upper Body:     Right upper body: No supraclavicular or axillary adenopathy.     Left upper body: No supraclavicular or axillary adenopathy.     Lower Body: No right inguinal adenopathy. No left inguinal adenopathy.  Skin:    General: Skin is warm and dry.  Neurological:     Mental Status: He is alert and oriented to person, place, and time.  Psychiatric:        Behavior: Behavior normal.        Thought Content: Thought content normal.  Judgment: Judgment normal.    Appointment on 11/08/2020  Component Date Value Ref Range Status  . WBC 11/08/2020 2.6* 4.0 - 10.5 K/uL Final  . RBC 11/08/2020 3.37* 4.22 - 5.81 MIL/uL Final  . Hemoglobin 11/08/2020 10.0* 13.0 - 17.0 g/dL Final  . HCT 11/08/2020 31.4* 39.0 - 52.0 % Final  . MCV 11/08/2020 93.2  80.0 - 100.0 fL Final  . MCH 11/08/2020 29.7  26.0 - 34.0 pg Final  . MCHC 11/08/2020 31.8  30.0 - 36.0 g/dL Final  . RDW 11/08/2020 17.5* 11.5 - 15.5 % Final  . Platelets 11/08/2020 316  150 - 400 K/uL Final  . nRBC 11/08/2020 0.0  0.0 - 0.2 % Final  . Neutrophils Relative % 11/08/2020 98  % Final  . Neutro Abs 11/08/2020 2.5  1.7 - 7.7 K/uL Final  . Lymphocytes Relative 11/08/2020 2  % Final  . Lymphs Abs 11/08/2020 0.1* 0.7 - 4.0 K/uL Final  . Monocytes Relative 11/08/2020 0  % Final  . Monocytes Absolute 11/08/2020 0.0* 0.1 - 1.0 K/uL Final  . Eosinophils Relative 11/08/2020 0  % Final  . Eosinophils Absolute 11/08/2020 0.0  0.0 - 0.5 K/uL Final  . Basophils Relative 11/08/2020 0  % Final  . Basophils Absolute 11/08/2020 0.0  0.0 - 0.1 K/uL Final  . Immature Granulocytes 11/08/2020 0  % Final  . Abs Immature Granulocytes  11/08/2020 0.01  0.00 - 0.07 K/uL Final   Performed at [facility], [address] [place], [place] [address]    Assessment:  Orell Yuvin Bussiere. 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  He has received 2 courses of induction chemotherapy with daunorubicin and cytarabine.  Bone marrow on 04/01/2020 revealed AML.  Bone marrow on 04/26/2020 was hypocellular (5-10%) with 5% blasts.  Bone marrow on 05/24/2020 revealed 30% cellularity with 10% blasts and focal clustering up to 30-40%.  Bone marrow on 07/05/2020 revealed 50% cellularity with 2% blasts.  MRD was negative by flow cytometry.  He received cytarabine and daunorubicin (7+3) beginning 04/13/2020 and 05/27/2020.  Course has been complicated by fever and neutropenia with mucositis (04/27/2020) and gluteal cleft cellulitis (05/06/2020).  He is day 5 s/p cycle #4 HiDAC consolidation with Udenyca support (began 07/25/2020 - 11/04/2020).  He has required platelet transfusion support: 2 units (08/04/2020 and 08/05/2020) with cycle #1, 1 unit (09/05/2020) with cycle #2, and 2 units with cycle #3 (10/17/2020 and 10/19/2020).  He has G6PD deficiency.  G6PD was 1.0 (5.3-10.3) on 04/02/2020.  He received the [place] COVID-19 vaccine on 07/27/2020 and 10/10/2020.  Symptomatically, he is doing well.  Exam reveals no adenopathy or hepatosplenomegaly.  Plan: 1.   Review labs since last visit and during recent admission. 2.   Acute myelogenous leukemia  He is s/p 2 courses of induction daunorubicin and cytarabine.  He is day 5 s/p cycle #4 high-dose ara-C (11/04/2020).    He required platelet transfusion support x2 with cycle #3 (02/21 and 02/23).  Current plan:    Neulasta post cycle #5 chemotherapy.   Bone marrow planned on 12/05/2020.  Transfusion requirement:    2 units PRBCs for hemoglobin < 8   1 unit platelets for platelet count < 10,000, bleeding or invasive procedure  He remains on  prophylactic antibiotics.   Levaquin, fluconazole, valacyclovir.  He remains unsure about bone marrow transplant.   Family members are still being tested.  Discuss symptom management.  He has antiemetics at  home to use on a prn bases.  Interventions are adequate.     3.   Neulasta today. 4.   RTC on 11/11/2020 for labs (CBC with diff). 5.   RTC on 11/14/2020 for MD assessment, labs (CBC with diff, hold tube) and +/- platelet transfusion. 6.   RTC Monday, Wednesday, and Fridays x 2 weeks starting next week for labs (CBC with diff, hold tube).  I discussed the assessment and treatment plan with the patient.  The patient was provided an opportunity to ask questions and all were answered.  The patient agreed with the plan and demonstrated an understanding of the instructions.  The patient was advised to call back if the symptoms worsen or if the condition fails to improve as anticipated.  I provided 12 minutes of face-to-face time during this this encounter and > 50% was spent counseling as documented under my assessment and plan. An additional 10 minutes were spent reviewing his chart (Epic and Care Everywhere) including notes, labs, and imaging studies.    Lequita Asal, MD, PhD 11/08/2020, 5:00 PM  I, Mirian Mo Tufford, am acting as Education administrator for [employer]. Mike Gip, MD, PhD.  I, Nash Bolls C. Mike Gip, MD, have reviewed the above documentation for accuracy and completeness, and I agree with the above.

## 2020-11-08 ENCOUNTER — Ambulatory Visit: Payer: No Typology Code available for payment source

## 2020-11-08 ENCOUNTER — Inpatient Hospital Stay: Payer: No Typology Code available for payment source

## 2020-11-08 ENCOUNTER — Other Ambulatory Visit: Payer: Self-pay

## 2020-11-08 ENCOUNTER — Other Ambulatory Visit: Payer: No Typology Code available for payment source

## 2020-11-08 ENCOUNTER — Inpatient Hospital Stay (HOSPITAL_BASED_OUTPATIENT_CLINIC_OR_DEPARTMENT_OTHER): Payer: No Typology Code available for payment source | Admitting: Hematology and Oncology

## 2020-11-08 VITALS — BP 121/71 | HR 63 | Temp 96.2°F | Resp 18 | Wt 211.6 lb

## 2020-11-08 DIAGNOSIS — D701 Agranulocytosis secondary to cancer chemotherapy: Secondary | ICD-10-CM | POA: Insufficient documentation

## 2020-11-08 DIAGNOSIS — C9201 Acute myeloblastic leukemia, in remission: Secondary | ICD-10-CM | POA: Insufficient documentation

## 2020-11-08 DIAGNOSIS — Z5189 Encounter for other specified aftercare: Secondary | ICD-10-CM | POA: Diagnosis not present

## 2020-11-08 DIAGNOSIS — C9200 Acute myeloblastic leukemia, not having achieved remission: Secondary | ICD-10-CM | POA: Diagnosis present

## 2020-11-08 LAB — CBC WITH DIFFERENTIAL/PLATELET
Abs Immature Granulocytes: 0.01 10*3/uL (ref 0.00–0.07)
Basophils Absolute: 0 10*3/uL (ref 0.0–0.1)
Basophils Relative: 0 %
Eosinophils Absolute: 0 10*3/uL (ref 0.0–0.5)
Eosinophils Relative: 0 %
HCT: 31.4 % — ABNORMAL LOW (ref 39.0–52.0)
Hemoglobin: 10 g/dL — ABNORMAL LOW (ref 13.0–17.0)
Immature Granulocytes: 0 %
Lymphocytes Relative: 2 %
Lymphs Abs: 0.1 10*3/uL — ABNORMAL LOW (ref 0.7–4.0)
MCH: 29.7 pg (ref 26.0–34.0)
MCHC: 31.8 g/dL (ref 30.0–36.0)
MCV: 93.2 fL (ref 80.0–100.0)
Monocytes Absolute: 0 10*3/uL — ABNORMAL LOW (ref 0.1–1.0)
Monocytes Relative: 0 %
Neutro Abs: 2.5 10*3/uL (ref 1.7–7.7)
Neutrophils Relative %: 98 %
Platelets: 316 10*3/uL (ref 150–400)
RBC: 3.37 MIL/uL — ABNORMAL LOW (ref 4.22–5.81)
RDW: 17.5 % — ABNORMAL HIGH (ref 11.5–15.5)
WBC: 2.6 10*3/uL — ABNORMAL LOW (ref 4.0–10.5)
nRBC: 0 % (ref 0.0–0.2)

## 2020-11-08 LAB — SAMPLE TO BLOOD BANK

## 2020-11-08 MED ORDER — PEGFILGRASTIM INJECTION 6 MG/0.6ML ~~LOC~~
6.0000 mg | PREFILLED_SYRINGE | Freq: Once | SUBCUTANEOUS | Status: AC
  Administered 2020-11-08: 6 mg via SUBCUTANEOUS
  Filled 2020-11-08: qty 0.6

## 2020-11-10 ENCOUNTER — Other Ambulatory Visit: Payer: No Typology Code available for payment source

## 2020-11-10 ENCOUNTER — Ambulatory Visit: Payer: No Typology Code available for payment source

## 2020-11-10 NOTE — Progress Notes (Signed)
[facility]  [address] [place], [place] [address] Phone: [phone]  Fax: [phone]   Clinic Day: 11/14/20  Referring physician: Lequita Asal, MD  Chief Complaint: Carl Youman. is a 45 y.o. male with AML who is seen for assessment on day 11 s/p cycle #4 HiDAC.  HPI: The patient was last seen in the medical oncology clinic on 11/08/2020. At that time, he felt "ok".  He described a little nausea. Hematocrit was 31.4, hemoglobin 10.0, MCV 93.2, platelets 316,000, WBC 2,600 (ANC 2,500). He received Neulasta.  Labs on 11/11/2020 revealed a hematocrit of 31.0, hemoglobin 9.9, platelets 104,000, WBC 800 (ANC 600).  During the interim, he has been "alright." He is a little bit tired. He denies nausea, vomiting, diarrhea, chest pain, palpitations, bruising, and bleeding. Weight is down 6 lbs. He stopped eating red meat and bread. His goal weight is 180 lbs.   Past Medical History:  Diagnosis Date  . Acute myeloid leukemia in remission ([place])   . Agranulocytosis secondary to cancer chemotherapy (CODE) ([place])   . Chemotherapy induced neutropenia ([place])   . Pancreatitis   . Pancytopenia ([place])   . Thrombocytopenia ([place])   . Tobacco use disorder     History reviewed. No pertinent surgical history.  Family History  Problem Relation Age of Onset  . Prostate cancer Neg Hx   . Kidney cancer Neg Hx   . Bladder Cancer Neg Hx     Social History:  reports that he quit smoking about 7 months ago. His smoking use included cigarettes. He has a 17.00 pack-year smoking history. He has never used smokeless tobacco. He reports previous alcohol use of about 6.0 standard drinks of alcohol per week. He reports that he does not use drugs. He has 2 grandkids (14 and 42 years old). The patient is alone today.  Allergies:  Allergies  Allergen Reactions  . Dapsone     Contraindication - G6PD deficient  . Primaquine     Contraindication - G6PD deficient   . Rasburicase     Contraindication - G6PD deficient    Current Medications: Current Outpatient Medications  Medication Sig Dispense Refill  . cyanocobalamin 1000 MCG tablet Take 1,000 mcg by mouth daily.    . cyclobenzaprine (FLEXERIL) 10 MG tablet Take 1 tablet (10 mg total) by mouth 3 (three) times daily as needed for muscle spasms. 30 tablet 0  . levofloxacin (LEVAQUIN) 500 MG tablet Take by mouth daily.    . magnesium oxide (MAG-OX) 400 MG tablet Take 800 mg by mouth daily.    . Multiple Vitamin (MULTIVITAMIN WITH MINERALS) TABS tablet Take 1 tablet by mouth daily.    . prednisoLONE acetate (PRED FORTE) 1 % ophthalmic suspension Administer 2 drops to both eyes every six (6) hours for 7 days.    . prochlorperazine (COMPAZINE) 10 MG tablet Take by mouth.    . valACYclovir (VALTREX) 500 MG tablet Take 500 mg by mouth daily.     No current facility-administered medications for this visit.    Review of Systems  Constitutional: Positive for weight loss (6 lbs). Negative for chills, diaphoresis, fever and malaise/fatigue.       Feels "alright."  HENT: Negative for congestion, ear discharge, ear pain, hearing loss, nosebleeds, sinus pain, sore throat and tinnitus.   Eyes: Negative for blurred vision and double vision.  Respiratory: Negative for cough, hemoptysis, sputum production and shortness of breath.   Cardiovascular: Negative for chest pain, palpitations and  leg swelling.  Gastrointestinal: Negative for abdominal pain, blood in stool, constipation, diarrhea, heartburn, melena, nausea (mild s/p chemo, resolved) and vomiting.  Genitourinary: Negative for dysuria, flank pain, frequency, hematuria and urgency.  Musculoskeletal: Negative for back pain, joint pain, myalgias and neck pain.  Skin: Negative for itching and rash.  Neurological: Negative for dizziness, tingling, sensory change, weakness and headaches.  Endo/Heme/Allergies: Does not bruise/bleed easily.   Psychiatric/Behavioral: Negative for depression and memory loss. The patient is not nervous/anxious and does not have insomnia.   All other systems reviewed and are negative.  Performance status (ECOG): 0  Vitals Blood pressure 118/73, pulse 98, temperature (!) 97.2 F (36.2 C), temperature source Tympanic, resp. rate 18, weight 205 lb 14.6 oz (93.4 kg), SpO2 100 %.   Physical Exam Vitals and nursing note reviewed.  Constitutional:      General: He is not in acute distress.    Appearance: He is not diaphoretic.  HENT:     Head: Normocephalic and atraumatic.     Comments: Black cap    Mouth/Throat:     Mouth: Mucous membranes are moist.     Pharynx: Oropharynx is clear.  Eyes:     General: No scleral icterus.    Extraocular Movements: Extraocular movements intact.     Conjunctiva/sclera: Conjunctivae normal.     Pupils: Pupils are equal, round, and reactive to light.  Cardiovascular:     Rate and Rhythm: Normal rate and regular rhythm.     Heart sounds: Normal heart sounds. No murmur heard.   Pulmonary:     Effort: Pulmonary effort is normal. No respiratory distress.     Breath sounds: Normal breath sounds. No wheezing or rales.  Chest:     Chest wall: No tenderness.  Breasts:     Right: No axillary adenopathy or supraclavicular adenopathy.     Left: No axillary adenopathy or supraclavicular adenopathy.    Abdominal:     General: Bowel sounds are normal. There is no distension.     Palpations: Abdomen is soft. There is no mass.     Tenderness: There is no abdominal tenderness. There is no guarding or rebound.  Musculoskeletal:        General: No swelling or tenderness. Normal range of motion.     Cervical back: Normal range of motion and neck supple.  Lymphadenopathy:     Head:     Right side of head: No preauricular, posterior auricular or occipital adenopathy.     Left side of head: No preauricular, posterior auricular or occipital adenopathy.     Cervical: No  cervical adenopathy.     Upper Body:     Right upper body: No supraclavicular or axillary adenopathy.     Left upper body: No supraclavicular or axillary adenopathy.     Lower Body: No right inguinal adenopathy. No left inguinal adenopathy.  Skin:    General: Skin is warm and dry.     Comments: No petechiae.  Neurological:     Mental Status: He is alert and oriented to person, place, and time.  Psychiatric:        Behavior: Behavior normal.        Thought Content: Thought content normal.        Judgment: Judgment normal.    Appointment on 11/14/2020  Component Date Value Ref Range Status  . WBC 11/14/2020 <0.1* 4.0 - 10.5 K/uL Final   Comment: WHITE COUNT CONFIRMED ON SMEAR THIS CRITICAL RESULT HAS VERIFIED AND BEEN  CALLED TO WHITE, TARA,RN BY PATRICIA CABELLERO ON 03 21 2022 AT 1147, AND HAS BEEN READ BACK.    Marland Kitchen RBC 11/14/2020 2.96* 4.22 - 5.81 MIL/uL Final  . Hemoglobin 11/14/2020 8.7* 13.0 - 17.0 g/dL Final  . HCT 11/14/2020 26.4* 39.0 - 52.0 % Final  . MCV 11/14/2020 89.2  80.0 - 100.0 fL Final  . MCH 11/14/2020 29.4  26.0 - 34.0 pg Final  . MCHC 11/14/2020 33.0  30.0 - 36.0 g/dL Final  . RDW 11/14/2020 16.5* 11.5 - 15.5 % Final  . Platelets 11/14/2020 13* 150 - 400 K/uL Final   Comment: Immature Platelet Fraction may be clinically indicated, consider ordering this additional test [id number] PLATELET COUNT CONFIRMED BY SMEAR PLATELETS APPEAR DECREASED THIS CRITICAL RESULT HAS VERIFIED AND BEEN CALLED TO WHITE, TARA,RN BY PATRICIA CABELLERO ON 03 21 2022 AT 1147, AND HAS BEEN READ BACK.    Marland Kitchen nRBC 11/14/2020 0.0  0.0 - 0.2 % Final  . Neutrophils Relative % 11/14/2020 Not Measured  % Final  . Neutro Abs 11/14/2020 Not Measured  1.7 - 7.7 K/uL Final  . Lymphocytes Relative 11/14/2020 Not Measured  % Final  . Lymphs Abs 11/14/2020 Not Measured  0.7 - 4.0 K/uL Final  . Monocytes Relative 11/14/2020 Not Measured  % Final  . Monocytes Absolute 11/14/2020 Not Measured  0.1 - 1.0  K/uL Final  . Eosinophils Relative 11/14/2020 Not Measured  % Final  . Eosinophils Absolute 11/14/2020 Not Measured  0.0 - 0.5 K/uL Final  . Basophils Relative 11/14/2020 Not Measured  % Final  . Basophils Absolute 11/14/2020 Not Measured  0.0 - 0.1 K/uL Final  . RBC Morphology 11/14/2020 See Note   Final   MIXED RBC POPULATION.  Marland Kitchen Immature Granulocytes 11/14/2020 Not Measured  % Final  . Abs Immature Granulocytes 11/14/2020 Not Measured  0.00 - 0.07 K/uL Final   Performed at [facility], [address] [place], [place] [address]    Assessment:  Carl Johnothan Bascomb. 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  He has received 2 courses of induction chemotherapy with daunorubicin and cytarabine.  Bone marrow on 04/01/2020 revealed AML.  Bone marrow on 04/26/2020 was hypocellular (5-10%) with 5% blasts.  Bone marrow on 05/24/2020 revealed 30% cellularity with 10% blasts and focal clustering up to 30-40%.  Bone marrow on 07/05/2020 revealed 50% cellularity with 2% blasts.  MRD was negative by flow cytometry.  He received cytarabine and daunorubicin (7+3) beginning 04/13/2020 and 05/27/2020.  Course has been complicated by fever and neutropenia with mucositis (04/27/2020) and gluteal cleft cellulitis (05/06/2020).  He is day 11 s/p cycle #4 HiDAC consolidation with Udenyca/Neulasta support (07/25/2020 - 11/04/2020).  He has required platelet transfusion support: 2 units (08/04/2020 and 08/05/2020) with cycle #1, 1 unit (09/05/2020) with cycle #2, and 2 units with cycle #3 (10/17/2020 and 10/19/2020).  He has G6PD deficiency.  G6PD was 1.0 (5.3-10.3) on 04/02/2020.  He received the [place] COVID-19 vaccine on 07/27/2020 and 10/10/2020.  Symptomatically,  he feels "alright." He is a little bit tired. He denies nausea, vomiting, diarrhea, chest pain, palpitations, bruising, and bleeding. Weight is down 6 lbs intentionally.  Exam reveals no adenopathy or  hepatosplenomegaly.  Hemoglobin is 8.7, platelets 13,000, WBC < 100.  Plan: 1.   Labs today: CBC with diff, hold tube. 2.   Acute myelogenous leukemia  He is s/p 4 courses of induction daunorubicin and cytarabine.  He is day 77  s/p cycle #4 high-dose ara-C (11/04/2020).    He has required platelet transfusion support with each cycle.  Symptomatically, he is fatigued.  Exam is stable.  Current plan:   Bone marrow aspirate and biopsy on 12/05/2020.  Transfusion requirement:    2 units PRBCs for hemoglobin < 8   1 unit platelets for platelet count < 10,000, bleeding or invasive procedure  He remains on prophylactic antibiotics.   Levaquin, fluconazole, valacyclovir.  He is unsure about bone marrow transplant.   Family members are still being tested.  Labs reviewed.  Anticipate platelet transfusion tomorrow.  Discuss symptom management.  He has antiemetics at home to use on a prn bases.  Interventions are adequate. 3.   Leukopenia, resolved  WBC < 100 with an ANC of 0.  Patient on prophylactic antibiotics.  Neutropenic precautions reviewed. 4.   Front desk:  Patient needs transportation daily this week to clinic. 5.   RTC at [place] tomorrow for labs (CBC with diff) and likely platelet transfusion.  Anticipate 1 hour post platelet count tomorrow. 6.   Patient has an appt for Wed and Friday this week.   7.   In addition, anticipate Thursday labs and +/- transfusion. 8.   RTC on 11/21/2020 for MD assessment, labs (CBC with diff, hold tube) and +/- transfusion.  I discussed the assessment and treatment plan with the patient.  The patient was provided an opportunity to ask questions and all were answered.  The patient agreed with the plan and demonstrated an understanding of the instructions.  The patient was advised to call back if the symptoms worsen or if the condition fails to improve as anticipated.  I provided 12 minutes of face-to-face time during this this encounter and > 50% was spent  counseling as documented under my assessment and plan.  An additional 10 minutes were spent reviewing his chart (Epic and Care Everywhere) including notes, labs, and imaging studies.    Lequita Asal, MD, PhD 11/14/2020, 5:00 PM  I, Mirian Mo Tufford, am acting as Education administrator for [employer]. Mike Gip, MD, PhD.  I, Derryck Shahan C. Mike Gip, MD, have reviewed the above documentation for accuracy and completeness, and I agree with the above.

## 2020-11-11 ENCOUNTER — Telehealth: Payer: Self-pay | Admitting: *Deleted

## 2020-11-11 ENCOUNTER — Inpatient Hospital Stay: Payer: No Typology Code available for payment source

## 2020-11-11 ENCOUNTER — Other Ambulatory Visit: Payer: No Typology Code available for payment source

## 2020-11-11 DIAGNOSIS — C9200 Acute myeloblastic leukemia, not having achieved remission: Secondary | ICD-10-CM | POA: Diagnosis not present

## 2020-11-11 DIAGNOSIS — C9201 Acute myeloblastic leukemia, in remission: Secondary | ICD-10-CM

## 2020-11-11 LAB — CBC WITH DIFFERENTIAL/PLATELET
Abs Immature Granulocytes: 0.09 10*3/uL — ABNORMAL HIGH (ref 0.00–0.07)
Basophils Absolute: 0 10*3/uL (ref 0.0–0.1)
Basophils Relative: 0 %
Eosinophils Absolute: 0 10*3/uL (ref 0.0–0.5)
Eosinophils Relative: 0 %
HCT: 31 % — ABNORMAL LOW (ref 39.0–52.0)
Hemoglobin: 9.9 g/dL — ABNORMAL LOW (ref 13.0–17.0)
Immature Granulocytes: 12 %
Lymphocytes Relative: 8 %
Lymphs Abs: 0.1 10*3/uL — ABNORMAL LOW (ref 0.7–4.0)
MCH: 29.6 pg (ref 26.0–34.0)
MCHC: 31.9 g/dL (ref 30.0–36.0)
MCV: 92.8 fL (ref 80.0–100.0)
Monocytes Absolute: 0 10*3/uL — ABNORMAL LOW (ref 0.1–1.0)
Monocytes Relative: 1 %
Neutro Abs: 0.6 10*3/uL — ABNORMAL LOW (ref 1.7–7.7)
Neutrophils Relative %: 79 %
Platelets: 104 10*3/uL — ABNORMAL LOW (ref 150–400)
RBC: 3.34 MIL/uL — ABNORMAL LOW (ref 4.22–5.81)
RDW: 16.8 % — ABNORMAL HIGH (ref 11.5–15.5)
Smear Review: NORMAL
WBC: 0.8 10*3/uL — CL (ref 4.0–10.5)
nRBC: 0 % (ref 0.0–0.2)

## 2020-11-11 LAB — SAMPLE TO BLOOD BANK

## 2020-11-11 NOTE — Telephone Encounter (Signed)
Call placed to patient to review lab results from today. Results reviewed and patient denies any questions or concerns. Patient to keep f/u as scheduled on 3/21.

## 2020-11-14 ENCOUNTER — Other Ambulatory Visit: Payer: Self-pay

## 2020-11-14 ENCOUNTER — Inpatient Hospital Stay: Payer: No Typology Code available for payment source

## 2020-11-14 ENCOUNTER — Ambulatory Visit: Payer: No Typology Code available for payment source

## 2020-11-14 ENCOUNTER — Inpatient Hospital Stay (HOSPITAL_BASED_OUTPATIENT_CLINIC_OR_DEPARTMENT_OTHER): Payer: No Typology Code available for payment source | Admitting: Hematology and Oncology

## 2020-11-14 ENCOUNTER — Other Ambulatory Visit: Payer: No Typology Code available for payment source

## 2020-11-14 ENCOUNTER — Encounter: Payer: Self-pay | Admitting: Hematology and Oncology

## 2020-11-14 VITALS — BP 118/73 | HR 98 | Temp 97.2°F | Resp 18 | Wt 205.9 lb

## 2020-11-14 DIAGNOSIS — C9201 Acute myeloblastic leukemia, in remission: Secondary | ICD-10-CM

## 2020-11-14 DIAGNOSIS — D61818 Other pancytopenia: Secondary | ICD-10-CM

## 2020-11-14 DIAGNOSIS — C9200 Acute myeloblastic leukemia, not having achieved remission: Secondary | ICD-10-CM | POA: Diagnosis not present

## 2020-11-14 LAB — CBC WITH DIFFERENTIAL/PLATELET
HCT: 26.4 % — ABNORMAL LOW (ref 39.0–52.0)
Hemoglobin: 8.7 g/dL — ABNORMAL LOW (ref 13.0–17.0)
MCH: 29.4 pg (ref 26.0–34.0)
MCHC: 33 g/dL (ref 30.0–36.0)
MCV: 89.2 fL (ref 80.0–100.0)
Platelets: 13 10*3/uL — CL (ref 150–400)
RBC: 2.96 MIL/uL — ABNORMAL LOW (ref 4.22–5.81)
RDW: 16.5 % — ABNORMAL HIGH (ref 11.5–15.5)
WBC: <0.1 10*3/uL — CL (ref 4.0–10.5)
nRBC: 0 % (ref 0.0–0.2)

## 2020-11-14 LAB — SAMPLE TO BLOOD BANK

## 2020-11-14 NOTE — Progress Notes (Signed)
Dr Mike Gip notified of critical lab value WBC <0.1 and plt level of 13

## 2020-11-14 NOTE — Progress Notes (Signed)
Patient here for oncology follow-up appointment, expresses no complaints or concerns at this time.    

## 2020-11-15 ENCOUNTER — Other Ambulatory Visit: Payer: Self-pay | Admitting: *Deleted

## 2020-11-15 ENCOUNTER — Ambulatory Visit: Payer: No Typology Code available for payment source

## 2020-11-15 ENCOUNTER — Other Ambulatory Visit: Payer: No Typology Code available for payment source

## 2020-11-15 ENCOUNTER — Inpatient Hospital Stay: Payer: No Typology Code available for payment source | Attending: Hematology and Oncology

## 2020-11-15 ENCOUNTER — Inpatient Hospital Stay: Payer: No Typology Code available for payment source

## 2020-11-15 ENCOUNTER — Other Ambulatory Visit: Payer: Self-pay | Admitting: Hematology and Oncology

## 2020-11-15 DIAGNOSIS — C9201 Acute myeloblastic leukemia, in remission: Secondary | ICD-10-CM

## 2020-11-15 DIAGNOSIS — Z5189 Encounter for other specified aftercare: Secondary | ICD-10-CM | POA: Insufficient documentation

## 2020-11-15 DIAGNOSIS — D696 Thrombocytopenia, unspecified: Secondary | ICD-10-CM

## 2020-11-15 DIAGNOSIS — C9200 Acute myeloblastic leukemia, not having achieved remission: Secondary | ICD-10-CM | POA: Diagnosis not present

## 2020-11-15 LAB — CBC WITH DIFFERENTIAL/PLATELET
Abs Immature Granulocytes: 0 10*3/uL (ref 0.00–0.07)
Basophils Absolute: 0 10*3/uL (ref 0.0–0.1)
Basophils Relative: 0 %
Eosinophils Absolute: 0 10*3/uL (ref 0.0–0.5)
Eosinophils Relative: 0 %
HCT: 25.7 % — ABNORMAL LOW (ref 39.0–52.0)
Hemoglobin: 8.5 g/dL — ABNORMAL LOW (ref 13.0–17.0)
Immature Granulocytes: 0 %
Lymphocytes Relative: 50 %
Lymphs Abs: 0 10*3/uL — ABNORMAL LOW (ref 0.7–4.0)
MCH: 29.6 pg (ref 26.0–34.0)
MCHC: 33.1 g/dL (ref 30.0–36.0)
MCV: 89.5 fL (ref 80.0–100.0)
Monocytes Absolute: 0 10*3/uL — ABNORMAL LOW (ref 0.1–1.0)
Monocytes Relative: 25 %
Neutro Abs: 0 10*3/uL — CL (ref 1.7–7.7)
Neutrophils Relative %: 25 %
Platelets: 6 10*3/uL — CL (ref 150–400)
RBC: 2.87 MIL/uL — ABNORMAL LOW (ref 4.22–5.81)
RDW: 16.2 % — ABNORMAL HIGH (ref 11.5–15.5)
WBC: <0.1 10*3/uL — CL (ref 4.0–10.5)
nRBC: 0 % (ref 0.0–0.2)

## 2020-11-15 LAB — PLATELET COUNT: Platelets: 37 10*3/uL — ABNORMAL LOW (ref 150–400)

## 2020-11-15 LAB — SAMPLE TO BLOOD BANK

## 2020-11-15 MED ORDER — DIPHENHYDRAMINE HCL 25 MG PO CAPS
25.0000 mg | ORAL_CAPSULE | Freq: Once | ORAL | Status: AC
  Administered 2020-11-15: 25 mg via ORAL
  Filled 2020-11-15: qty 1

## 2020-11-15 MED ORDER — SODIUM CHLORIDE 0.9% IV SOLUTION
250.0000 mL | Freq: Once | INTRAVENOUS | Status: AC
  Administered 2020-11-15: 250 mL via INTRAVENOUS
  Filled 2020-11-15: qty 250

## 2020-11-15 MED ORDER — HEPARIN SOD (PORK) LOCK FLUSH 100 UNIT/ML IV SOLN
500.0000 [IU] | Freq: Once | INTRAVENOUS | Status: AC
  Administered 2020-11-15: 500 [IU] via INTRAVENOUS
  Filled 2020-11-15: qty 5

## 2020-11-15 MED ORDER — ACETAMINOPHEN 325 MG PO TABS
650.0000 mg | ORAL_TABLET | Freq: Once | ORAL | Status: AC
  Administered 2020-11-15: 650 mg via ORAL
  Filled 2020-11-15: qty 2

## 2020-11-15 NOTE — Progress Notes (Signed)
Pt status post one unit of platelets, had one hour post platelet drawn and sent to lab. For f/u tomorrow

## 2020-11-16 ENCOUNTER — Inpatient Hospital Stay: Payer: No Typology Code available for payment source

## 2020-11-16 ENCOUNTER — Other Ambulatory Visit: Payer: No Typology Code available for payment source

## 2020-11-16 ENCOUNTER — Other Ambulatory Visit: Payer: Self-pay

## 2020-11-16 DIAGNOSIS — C9200 Acute myeloblastic leukemia, not having achieved remission: Secondary | ICD-10-CM | POA: Diagnosis not present

## 2020-11-16 DIAGNOSIS — C9201 Acute myeloblastic leukemia, in remission: Secondary | ICD-10-CM

## 2020-11-16 LAB — CBC WITH DIFFERENTIAL/PLATELET
Abs Immature Granulocytes: 0 10*3/uL (ref 0.00–0.07)
Basophils Absolute: 0 10*3/uL (ref 0.0–0.1)
Basophils Relative: 0 %
Eosinophils Absolute: 0 10*3/uL (ref 0.0–0.5)
Eosinophils Relative: 0 %
HCT: 26.1 % — ABNORMAL LOW (ref 39.0–52.0)
Hemoglobin: 8.4 g/dL — ABNORMAL LOW (ref 13.0–17.0)
Lymphocytes Relative: 0 %
Lymphs Abs: 0 10*3/uL — ABNORMAL LOW (ref 0.7–4.0)
MCH: 29 pg (ref 26.0–34.0)
MCHC: 32.2 g/dL (ref 30.0–36.0)
MCV: 90 fL (ref 80.0–100.0)
Monocytes Absolute: 0 10*3/uL — ABNORMAL LOW (ref 0.1–1.0)
Monocytes Relative: 0 %
Neutro Abs: 0 10*3/uL — CL (ref 1.7–7.7)
Neutrophils Relative %: 0 %
Platelets: 23 10*3/uL — CL (ref 150–400)
RBC: 2.9 MIL/uL — ABNORMAL LOW (ref 4.22–5.81)
RDW: 16.4 % — ABNORMAL HIGH (ref 11.5–15.5)
Smear Review: DECREASED
WBC: 0.6 10*3/uL — CL (ref 4.0–10.5)
nRBC: 0 % (ref 0.0–0.2)

## 2020-11-16 LAB — BPAM PLATELET PHERESIS
Blood Product Expiration Date: 202203242359
ISSUE DATE / TIME: 202203221021
Unit Type and Rh: 7300

## 2020-11-16 LAB — SAMPLE TO BLOOD BANK

## 2020-11-16 LAB — PREPARE PLATELET PHERESIS: Unit division: 0

## 2020-11-16 NOTE — Progress Notes (Signed)
Dr Mike Gip notified of critical lab value wbc 0.6 and plt count 23

## 2020-11-17 ENCOUNTER — Inpatient Hospital Stay: Payer: No Typology Code available for payment source

## 2020-11-17 ENCOUNTER — Ambulatory Visit: Payer: No Typology Code available for payment source

## 2020-11-17 ENCOUNTER — Other Ambulatory Visit: Payer: No Typology Code available for payment source

## 2020-11-17 DIAGNOSIS — C9200 Acute myeloblastic leukemia, not having achieved remission: Secondary | ICD-10-CM | POA: Diagnosis not present

## 2020-11-17 DIAGNOSIS — C9201 Acute myeloblastic leukemia, in remission: Secondary | ICD-10-CM

## 2020-11-17 LAB — CBC WITH DIFFERENTIAL/PLATELET
Abs Immature Granulocytes: 1.2 10*3/uL — ABNORMAL HIGH (ref 0.00–0.07)
Band Neutrophils: 27 %
Basophils Absolute: 0 10*3/uL (ref 0.0–0.1)
Basophils Relative: 0 %
Eosinophils Absolute: 0 10*3/uL (ref 0.0–0.5)
Eosinophils Relative: 0 %
HCT: 27.3 % — ABNORMAL LOW (ref 39.0–52.0)
Hemoglobin: 9.1 g/dL — ABNORMAL LOW (ref 13.0–17.0)
Lymphocytes Relative: 6 %
Lymphs Abs: 0.4 10*3/uL — ABNORMAL LOW (ref 0.7–4.0)
MCH: 29.4 pg (ref 26.0–34.0)
MCHC: 33.3 g/dL (ref 30.0–36.0)
MCV: 88.3 fL (ref 80.0–100.0)
Metamyelocytes Relative: 10 %
Monocytes Absolute: 1 10*3/uL (ref 0.1–1.0)
Monocytes Relative: 14 %
Myelocytes: 6 %
Neutro Abs: 4.3 10*3/uL (ref 1.7–7.7)
Neutrophils Relative %: 36 %
Platelets: 18 10*3/uL — CL (ref 150–400)
Promyelocytes Relative: 1 %
RBC: 3.09 MIL/uL — ABNORMAL LOW (ref 4.22–5.81)
RDW: 16.6 % — ABNORMAL HIGH (ref 11.5–15.5)
WBC Morphology: INCREASED
WBC: 6.9 10*3/uL (ref 4.0–10.5)
nRBC: 0 % (ref 0.0–0.2)

## 2020-11-17 NOTE — Progress Notes (Signed)
[facility]  [address], [address] [place], [place] [address] Phone: [phone]  Fax: [phone]   Clinic Day: 11/21/20  Referring physician: Lequita Asal, MD  Chief Complaint: Carl [name]. is a 45 y.o. male with AML who is seen for assessment on day 18 s/p cycle #4 HiDAC.  HPI: The patient was last seen in the medical oncology clinic on 11/14/2020. At that time, he was day 11 s/p cycle #4 HiDAC.  He felt a little tired.  Weight was down 6 pounds.  Hematocrit was 26.4, hemoglobin 8.7, MCV 89.2, platelets 13,000, WBC <100.  Labs followed: 11/15/2020: Hematocrit 25.7, hemoglobin 8.5, MCV 89.5, platelets   6,000, WBC    <100 (ANC 0). 11/16/2020: Hematocrit 26.1, hemoglobin 8.4, MCV 90.0, platelets 23,000, WBC      600 (ANC 0). 11/17/2020: Hematocrit 27.3, hemoglobin 9.1, MCV 88.3, platelets 18,000, WBC   6,900 (ANC   4,300). 11/18/2020: Hematocrit 28.1, hemoglobin 9.2, MCV 90.6, platelets 15,000, WBC 18,100 (ANC 14,700). 11/19/2020: Hematocrit 27.6, hemoglobin 9.1, MCV 91.1, platelets 18,000, WBC 16,000 (ANC 10,700).  He received one unit of platelets on 11/15/2020. One hour post platelet count was 37,000.  During the interim, he has been "same old same old." His vision is blurry, mainly in his left eye. He stopped using steroid drops a week ago. His energy level is good. When it rained last week, his body ached. He cut his left forearm on the stove but it did not bleed a lot. He put hydrogen peroxide on it and it is almost healed. He denies chest pain, shortness of breath, nausea, vomiting, diarrhea, and bruising.   Past Medical History:  Diagnosis Date  . Acute myeloid leukemia in remission ([place])   . Agranulocytosis secondary to cancer chemotherapy (CODE) ([place])   . Chemotherapy induced neutropenia ([place])   . Pancreatitis   . Pancytopenia ([place])   . Thrombocytopenia ([place])   . Tobacco use disorder     History reviewed. No pertinent surgical  history.  Family History  Problem Relation Age of Onset  . Prostate cancer Neg Hx   . Kidney cancer Neg Hx   . Bladder Cancer Neg Hx     Social History:  reports that he quit smoking about 7 months ago. His smoking use included cigarettes. He has a 17.00 pack-year smoking history. He has never used smokeless tobacco. He reports previous alcohol use of about 6.0 standard drinks of alcohol per week. He reports that he does not use drugs. He has 2 grandkids (16 and 29 years old). The patient is alone today.  Allergies:  Allergies  Allergen Reactions  . Dapsone     Contraindication - G6PD deficient  . Primaquine     Contraindication - G6PD deficient  . Rasburicase     Contraindication - G6PD deficient    Current Medications: Current Outpatient Medications  Medication Sig Dispense Refill  . cyanocobalamin 1000 MCG tablet Take 1,000 mcg by mouth daily.    . cyclobenzaprine (FLEXERIL) 10 MG tablet Take 1 tablet (10 mg total) by mouth 3 (three) times daily as needed for muscle spasms. 30 tablet 0  . levofloxacin (LEVAQUIN) 500 MG tablet Take by mouth daily.    . magnesium oxide (MAG-OX) 400 MG tablet Take 800 mg by mouth daily.    . Multiple Vitamin (MULTIVITAMIN WITH MINERALS) TABS tablet Take 1 tablet by mouth daily.    . prochlorperazine (COMPAZINE) 10 MG tablet Take by mouth.    . valACYclovir (  VALTREX) 500 MG tablet Take 500 mg by mouth daily.     No current facility-administered medications for this visit.    Review of Systems  Constitutional: Negative for chills, diaphoresis, fever, malaise/fatigue and weight loss (up 1 lb).       Feels the same. Energy level is good.  HENT: Negative for congestion, ear discharge, ear pain, hearing loss, nosebleeds, sinus pain, sore throat and tinnitus.   Eyes: Positive for blurred vision. Negative for double vision.  Respiratory: Negative for cough, hemoptysis, sputum production and shortness of breath.   Cardiovascular: Negative for chest  pain, palpitations and leg swelling.  Gastrointestinal: Negative for abdominal pain, blood in stool, constipation, diarrhea, heartburn, melena, nausea and vomiting.  Genitourinary: Negative for dysuria, flank pain, frequency, hematuria and urgency.  Musculoskeletal: Negative for back pain, joint pain, myalgias and neck pain.  Skin: Negative for itching and rash.  Neurological: Negative for dizziness, tingling, sensory change, weakness and headaches.  Endo/Heme/Allergies: Does not bruise/bleed easily.  Psychiatric/Behavioral: Negative for depression and memory loss. The patient is not nervous/anxious and does not have insomnia.   All other systems reviewed and are negative.  Performance status (ECOG): 0  Vitals Blood pressure 120/70, pulse 99, temperature 97.8 F (36.6 C), resp. rate 20, weight 206 lb 0.3 oz (93.5 kg), SpO2 100 %.   Physical Exam Vitals and nursing note reviewed.  Constitutional:      General: He is not in acute distress.    Appearance: He is not diaphoretic.  HENT:     Head: Normocephalic and atraumatic.     Comments: Black cap    Mouth/Throat:     Mouth: Mucous membranes are moist.     Pharynx: Oropharynx is clear.  Eyes:     General: No scleral icterus.    Extraocular Movements: Extraocular movements intact.     Conjunctiva/sclera: Conjunctivae normal.     Pupils: Pupils are equal, round, and reactive to light.  Cardiovascular:     Rate and Rhythm: Normal rate and regular rhythm.     Heart sounds: Normal heart sounds. No murmur heard.   Pulmonary:     Effort: Pulmonary effort is normal. No respiratory distress.     Breath sounds: Normal breath sounds. No wheezing or rales.  Chest:     Chest wall: No tenderness.  Breasts:     Right: No axillary adenopathy or supraclavicular adenopathy.     Left: No axillary adenopathy or supraclavicular adenopathy.    Abdominal:     General: Bowel sounds are normal. There is no distension.     Palpations: Abdomen is  soft. There is no mass.     Tenderness: There is no abdominal tenderness. There is no guarding or rebound.  Musculoskeletal:        General: No swelling or tenderness. Normal range of motion.     Cervical back: Normal range of motion and neck supple.  Lymphadenopathy:     Head:     Right side of head: No preauricular, posterior auricular or occipital adenopathy.     Left side of head: No preauricular, posterior auricular or occipital adenopathy.     Cervical: No cervical adenopathy.     Upper Body:     Right upper body: No supraclavicular or axillary adenopathy.     Left upper body: No supraclavicular or axillary adenopathy.     Lower Body: No right inguinal adenopathy. No left inguinal adenopathy.  Skin:    General: Skin is warm and dry.  Comments: No petechiae. Scratches on legs.  Neurological:     Mental Status: He is alert and oriented to person, place, and time.     Cranial Nerves: No facial asymmetry.     Sensory: Sensation is intact.     Motor: Motor function is intact.     Coordination: Coordination is intact.  Psychiatric:        Behavior: Behavior normal.        Thought Content: Thought content normal.        Judgment: Judgment normal.    Appointment on 11/21/2020  Component Date Value Ref Range Status  . WBC 11/21/2020 9.8  4.0 - 10.5 K/uL Final  . RBC 11/21/2020 2.89* 4.22 - 5.81 MIL/uL Final  . Hemoglobin 11/21/2020 8.4* 13.0 - 17.0 g/dL Final  . HCT 11/21/2020 26.2* 39.0 - 52.0 % Final  . MCV 11/21/2020 90.7  80.0 - 100.0 fL Final  . MCH 11/21/2020 29.1  26.0 - 34.0 pg Final  . MCHC 11/21/2020 32.1  30.0 - 36.0 g/dL Final  . RDW 11/21/2020 17.4* 11.5 - 15.5 % Final  . Platelets 11/21/2020 50* 150 - 400 K/uL Final   Comment: Immature Platelet Fraction may be clinically indicated, consider ordering this additional test [id number] CONSISTENT WITH PREVIOUS RESULT PLATELET COUNT CONFIRMED BY SMEAR   . nRBC 11/21/2020 0.9* 0.0 - 0.2 % Final  . Neutrophils  Relative % 11/21/2020 68  % Final  . Neutro Abs 11/21/2020 6.6  1.7 - 7.7 K/uL Final  . Lymphocytes Relative 11/21/2020 8  % Final  . Lymphs Abs 11/21/2020 0.8  0.7 - 4.0 K/uL Final  . Monocytes Relative 11/21/2020 14  % Final  . Monocytes Absolute 11/21/2020 1.4* 0.1 - 1.0 K/uL Final  . Eosinophils Relative 11/21/2020 0  % Final  . Eosinophils Absolute 11/21/2020 0.0  0.0 - 0.5 K/uL Final  . Basophils Relative 11/21/2020 0  % Final  . Basophils Absolute 11/21/2020 0.0  0.0 - 0.1 K/uL Final  . WBC Morphology 11/21/2020 MILD LEFT SHIFT (1-5% METAS, OCC MYELO, OCC BANDS)   Final  . Smear Review 11/21/2020 Normal platelet morphology   Final   Reviewed  . Immature Granulocytes 11/21/2020 10  % Final  . Abs Immature Granulocytes 11/21/2020 1.01* 0.00 - 0.07 K/uL Final  . Polychromasia 11/21/2020 PRESENT   Final   Performed at [facility], [address], [place], [place] [address]  Hospital Outpatient Visit on 11/19/2020  Component Date Value Ref Range Status  . WBC 11/19/2020 16.0* 4.0 - 10.5 K/uL Final  . RBC 11/19/2020 3.03* 4.22 - 5.81 MIL/uL Final  . Hemoglobin 11/19/2020 9.1* 13.0 - 17.0 g/dL Final  . HCT 11/19/2020 27.6* 39.0 - 52.0 % Final  . MCV 11/19/2020 91.1  80.0 - 100.0 fL Final  . MCH 11/19/2020 30.0  26.0 - 34.0 pg Final  . MCHC 11/19/2020 33.0  30.0 - 36.0 g/dL Final  . RDW 11/19/2020 17.1* 11.5 - 15.5 % Final  . Platelets 11/19/2020 18* 150 - 400 K/uL Final   Comment: Immature Platelet Fraction may be clinically indicated, consider ordering this additional test [id number] THIS CRITICAL RESULT HAS VERIFIED AND BEEN CALLED TO DR YU BY AMY RILEY ON 03 26 2022 AT 1431, AND HAS BEEN READ BACK.    Marland Kitchen nRBC 11/19/2020 0.3* 0.0 - 0.2 % Final  . Neutrophils Relative % 11/19/2020 67  % Final  . Neutro Abs 11/19/2020 10.7* 1.7 - 7.7 K/uL Final  . Lymphocytes Relative  11/19/2020 3  % Final  . Lymphs Abs 11/19/2020 0.5* 0.7 - 4.0 K/uL Final  . Monocytes Relative  11/19/2020 17  % Final  . Monocytes Absolute 11/19/2020 2.8* 0.1 - 1.0 K/uL Final  . Eosinophils Relative 11/19/2020 0  % Final  . Eosinophils Absolute 11/19/2020 0.0  0.0 - 0.5 K/uL Final  . Basophils Relative 11/19/2020 1  % Final  . Basophils Absolute 11/19/2020 0.1  0.0 - 0.1 K/uL Final  . WBC Morphology 11/19/2020 MILD LEFT SHIFT (1-5% METAS, OCC MYELO, OCC BANDS)   Final   TOXIC GRANULATION  . RBC Morphology 11/19/2020 MIXED RBC POPULATION   Final  . Smear Review 11/19/2020 PLATELETS APPEAR DECREASED   Final  . Immature Granulocytes 11/19/2020 12  % Final  . Abs Immature Granulocytes 11/19/2020 1.89* 0.00 - 0.07 K/uL Final  . Ovalocytes 11/19/2020 PRESENT   Final   Performed at [facility], [address] [place], [place] [address]    Assessment:  Zaccheus Draeden Kellman. 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  He has received 2 courses of induction chemotherapy with daunorubicin and cytarabine.  Bone marrow on 04/01/2020 revealed AML.  Bone marrow on 04/26/2020 was hypocellular (5-10%) with 5% blasts.  Bone marrow on 05/24/2020 revealed 30% cellularity with 10% blasts and focal clustering up to 30-40%.  Bone marrow on 07/05/2020 revealed 50% cellularity with 2% blasts.  MRD was negative by flow cytometry.  He received cytarabine and daunorubicin (7+3) beginning 04/13/2020 and 05/27/2020.  Course has been complicated by fever and neutropenia with mucositis (04/27/2020) and gluteal cleft cellulitis (05/06/2020).  He is day 18 s/p cycle #4 HiDAC consolidation with Udenyca/Neulasta support (07/25/2020 - 11/04/2020).  He has required platelet transfusion support: 2 units (08/04/2020 and 08/05/2020) with cycle #1, 1 unit (09/05/2020) with cycle #2, 2 units with cycle #3 (10/17/2020 and 10/19/2020), and 1 unit with cycle #4 (11/15/2020).  He has G6PD deficiency.  G6PD was 1.0 (5.3-10.3) on 04/02/2020.  He received the [place] COVID-19 vaccine on  07/27/2020 and 10/10/2020.  Symptomatically, he feels good.  He notes a little blurry vision when watching TV.  Exam is unremarkable.  Hemoglobin is 8.4, platelets 50,000, and WBC 9800 with an ANC of 6600.  Plan: 1.   Labs today: CBC with diff, hold tube. 2.   Acute myelogenous leukemia  He is s/p 4 courses of induction daunorubicin and cytarabine.  He is day 18 s/p cycle #4 high-dose ara-C (11/04/2020).    Counts are recovering.  Discuss plan for follow-up in the hematology clinic and bone marrow transplant clinic at [facility].  Transfusion requirement:    2 units PRBCs for hemoglobin < 8   1 unit platelets for platelet count < 10,000, bleeding or invasive procedure  He remains on prophylactic antibiotics.   Levaquin, fluconazole, valacyclovir.  Discuss symptom management.  He has antiemetics at home to use on a prn bases.  Interventions are adequate.   .     3.   Leukopenia, resolved  WBC 9800 with an ANC of 6600. 4.   Cancel all appt this week. 5.   RTC on Thursday for CBC with diff. 6.   Anticipate follow-up in clinic based on bone marrow planned for 12/05/2020  I discussed the assessment and treatment plan with the patient.  The patient was provided an opportunity to ask questions and all were answered.  The patient agreed with the plan and demonstrated an understanding of the instructions.  The patient was advised to call back if the symptoms worsen or if the condition fails to improve as anticipated.   Lequita Asal, MD ,PhD 11/21/2020, 11:56 AM  I, Mirian Mo Tufford, am acting as Education administrator for [employer]. Mike Gip, MD, PhD.  I, Akyra Bouchie C. Mike Gip, MD, have reviewed the above documentation for accuracy and completeness, and I agree with the above.

## 2020-11-17 NOTE — Progress Notes (Signed)
Per Dr Mike Gip, pt does not need blood product today. Pt to return to clinic in [place] tomorrow for lab draw. Pt verbalizes understanding.

## 2020-11-18 ENCOUNTER — Other Ambulatory Visit: Payer: Self-pay

## 2020-11-18 ENCOUNTER — Inpatient Hospital Stay: Payer: No Typology Code available for payment source

## 2020-11-18 ENCOUNTER — Telehealth: Payer: Self-pay

## 2020-11-18 DIAGNOSIS — C9201 Acute myeloblastic leukemia, in remission: Secondary | ICD-10-CM

## 2020-11-18 DIAGNOSIS — C9200 Acute myeloblastic leukemia, not having achieved remission: Secondary | ICD-10-CM | POA: Diagnosis not present

## 2020-11-18 LAB — CBC WITH DIFFERENTIAL/PLATELET
Abs Immature Granulocytes: 1.3 10*3/uL — ABNORMAL HIGH (ref 0.00–0.07)
Band Neutrophils: 11 %
Basophils Absolute: 0 10*3/uL (ref 0.0–0.1)
Basophils Relative: 0 %
Eosinophils Absolute: 0 10*3/uL (ref 0.0–0.5)
Eosinophils Relative: 0 %
HCT: 28.1 % — ABNORMAL LOW (ref 39.0–52.0)
Hemoglobin: 9.2 g/dL — ABNORMAL LOW (ref 13.0–17.0)
Lymphocytes Relative: 3 %
Lymphs Abs: 0.5 10*3/uL — ABNORMAL LOW (ref 0.7–4.0)
MCH: 29.7 pg (ref 26.0–34.0)
MCHC: 32.7 g/dL (ref 30.0–36.0)
MCV: 90.6 fL (ref 80.0–100.0)
Metamyelocytes Relative: 3 %
Monocytes Absolute: 1.6 10*3/uL — ABNORMAL HIGH (ref 0.1–1.0)
Monocytes Relative: 9 %
Myelocytes: 4 %
Neutro Abs: 14.7 10*3/uL — ABNORMAL HIGH (ref 1.7–7.7)
Neutrophils Relative %: 70 %
Platelets: 15 10*3/uL — CL (ref 150–400)
RBC: 3.1 MIL/uL — ABNORMAL LOW (ref 4.22–5.81)
RDW: 17.1 % — ABNORMAL HIGH (ref 11.5–15.5)
Smear Review: NORMAL
WBC: 18.1 10*3/uL — ABNORMAL HIGH (ref 4.0–10.5)
nRBC: 0 % (ref 0.0–0.2)

## 2020-11-18 LAB — SAMPLE TO BLOOD BANK

## 2020-11-19 ENCOUNTER — Other Ambulatory Visit
Admission: RE | Admit: 2020-11-19 | Discharge: 2020-11-19 | Disposition: A | Discharge status: Home / Self Care | Payer: No Typology Code available for payment source | Source: Ambulatory Visit | Attending: Family Medicine | Admitting: Family Medicine

## 2020-11-19 DIAGNOSIS — C9201 Acute myeloblastic leukemia, in remission: Secondary | ICD-10-CM | POA: Insufficient documentation

## 2020-11-19 LAB — CBC WITH DIFFERENTIAL/PLATELET
Abs Immature Granulocytes: 1.89 10*3/uL — ABNORMAL HIGH (ref 0.00–0.07)
Basophils Absolute: 0.1 10*3/uL (ref 0.0–0.1)
Basophils Relative: 1 %
Eosinophils Absolute: 0 10*3/uL (ref 0.0–0.5)
Eosinophils Relative: 0 %
HCT: 27.6 % — ABNORMAL LOW (ref 39.0–52.0)
Hemoglobin: 9.1 g/dL — ABNORMAL LOW (ref 13.0–17.0)
Immature Granulocytes: 12 %
Lymphocytes Relative: 3 %
Lymphs Abs: 0.5 10*3/uL — ABNORMAL LOW (ref 0.7–4.0)
MCH: 30 pg (ref 26.0–34.0)
MCHC: 33 g/dL (ref 30.0–36.0)
MCV: 91.1 fL (ref 80.0–100.0)
Monocytes Absolute: 2.8 10*3/uL — ABNORMAL HIGH (ref 0.1–1.0)
Monocytes Relative: 17 %
Neutro Abs: 10.7 10*3/uL — ABNORMAL HIGH (ref 1.7–7.7)
Neutrophils Relative %: 67 %
Platelets: 18 10*3/uL — CL (ref 150–400)
RBC: 3.03 MIL/uL — ABNORMAL LOW (ref 4.22–5.81)
RDW: 17.1 % — ABNORMAL HIGH (ref 11.5–15.5)
Smear Review: DECREASED
WBC: 16 10*3/uL — ABNORMAL HIGH (ref 4.0–10.5)
nRBC: 0.3 % — ABNORMAL HIGH (ref 0.0–0.2)

## 2020-11-21 ENCOUNTER — Inpatient Hospital Stay (HOSPITAL_BASED_OUTPATIENT_CLINIC_OR_DEPARTMENT_OTHER): Payer: No Typology Code available for payment source | Admitting: Hematology and Oncology

## 2020-11-21 ENCOUNTER — Other Ambulatory Visit: Payer: No Typology Code available for payment source

## 2020-11-21 ENCOUNTER — Inpatient Hospital Stay: Payer: No Typology Code available for payment source

## 2020-11-21 ENCOUNTER — Other Ambulatory Visit: Payer: Self-pay

## 2020-11-21 ENCOUNTER — Encounter: Payer: Self-pay | Admitting: Hematology and Oncology

## 2020-11-21 VITALS — BP 120/70 | HR 99 | Temp 97.8°F | Resp 20 | Wt 206.0 lb

## 2020-11-21 DIAGNOSIS — C9201 Acute myeloblastic leukemia, in remission: Secondary | ICD-10-CM

## 2020-11-21 DIAGNOSIS — C9200 Acute myeloblastic leukemia, not having achieved remission: Secondary | ICD-10-CM | POA: Diagnosis not present

## 2020-11-21 DIAGNOSIS — D696 Thrombocytopenia, unspecified: Secondary | ICD-10-CM | POA: Diagnosis not present

## 2020-11-21 LAB — CBC WITH DIFFERENTIAL/PLATELET
Abs Immature Granulocytes: 1.01 10*3/uL — ABNORMAL HIGH (ref 0.00–0.07)
Basophils Absolute: 0 10*3/uL (ref 0.0–0.1)
Basophils Relative: 0 %
Eosinophils Absolute: 0 10*3/uL (ref 0.0–0.5)
Eosinophils Relative: 0 %
HCT: 26.2 % — ABNORMAL LOW (ref 39.0–52.0)
Hemoglobin: 8.4 g/dL — ABNORMAL LOW (ref 13.0–17.0)
Immature Granulocytes: 10 %
Lymphocytes Relative: 8 %
Lymphs Abs: 0.8 10*3/uL (ref 0.7–4.0)
MCH: 29.1 pg (ref 26.0–34.0)
MCHC: 32.1 g/dL (ref 30.0–36.0)
MCV: 90.7 fL (ref 80.0–100.0)
Monocytes Absolute: 1.4 10*3/uL — ABNORMAL HIGH (ref 0.1–1.0)
Monocytes Relative: 14 %
Neutro Abs: 6.6 10*3/uL (ref 1.7–7.7)
Neutrophils Relative %: 68 %
Platelets: 50 10*3/uL — ABNORMAL LOW (ref 150–400)
RBC: 2.89 MIL/uL — ABNORMAL LOW (ref 4.22–5.81)
RDW: 17.4 % — ABNORMAL HIGH (ref 11.5–15.5)
Smear Review: NORMAL
WBC: 9.8 10*3/uL (ref 4.0–10.5)
nRBC: 0.9 % — ABNORMAL HIGH (ref 0.0–0.2)

## 2020-11-21 LAB — SAMPLE TO BLOOD BANK

## 2020-11-22 ENCOUNTER — Ambulatory Visit: Payer: No Typology Code available for payment source

## 2020-11-22 ENCOUNTER — Other Ambulatory Visit: Payer: No Typology Code available for payment source

## 2020-11-23 ENCOUNTER — Inpatient Hospital Stay: Payer: No Typology Code available for payment source

## 2020-11-23 ENCOUNTER — Ambulatory Visit: Payer: No Typology Code available for payment source

## 2020-11-23 ENCOUNTER — Other Ambulatory Visit: Payer: No Typology Code available for payment source

## 2020-11-24 ENCOUNTER — Other Ambulatory Visit: Payer: Self-pay

## 2020-11-24 ENCOUNTER — Other Ambulatory Visit: Payer: No Typology Code available for payment source

## 2020-11-24 ENCOUNTER — Ambulatory Visit: Payer: No Typology Code available for payment source

## 2020-11-24 ENCOUNTER — Inpatient Hospital Stay: Payer: No Typology Code available for payment source

## 2020-11-24 DIAGNOSIS — D696 Thrombocytopenia, unspecified: Secondary | ICD-10-CM

## 2020-11-24 DIAGNOSIS — C9201 Acute myeloblastic leukemia, in remission: Secondary | ICD-10-CM

## 2020-11-24 DIAGNOSIS — C9200 Acute myeloblastic leukemia, not having achieved remission: Secondary | ICD-10-CM | POA: Diagnosis not present

## 2020-11-24 LAB — CBC WITH DIFFERENTIAL/PLATELET
Abs Immature Granulocytes: 0.37 10*3/uL — ABNORMAL HIGH (ref 0.00–0.07)
Basophils Absolute: 0 10*3/uL (ref 0.0–0.1)
Basophils Relative: 0 %
Eosinophils Absolute: 0 10*3/uL (ref 0.0–0.5)
Eosinophils Relative: 0 %
HCT: 27 % — ABNORMAL LOW (ref 39.0–52.0)
Hemoglobin: 8.7 g/dL — ABNORMAL LOW (ref 13.0–17.0)
Immature Granulocytes: 6 %
Lymphocytes Relative: 12 %
Lymphs Abs: 0.8 10*3/uL (ref 0.7–4.0)
MCH: 29.6 pg (ref 26.0–34.0)
MCHC: 32.2 g/dL (ref 30.0–36.0)
MCV: 91.8 fL (ref 80.0–100.0)
Monocytes Absolute: 1 10*3/uL (ref 0.1–1.0)
Monocytes Relative: 15 %
Neutro Abs: 4.5 10*3/uL (ref 1.7–7.7)
Neutrophils Relative %: 67 %
Platelets: 197 10*3/uL (ref 150–400)
RBC: 2.94 MIL/uL — ABNORMAL LOW (ref 4.22–5.81)
RDW: 17.8 % — ABNORMAL HIGH (ref 11.5–15.5)
WBC: 6.8 10*3/uL (ref 4.0–10.5)
nRBC: 2.2 % — ABNORMAL HIGH (ref 0.0–0.2)

## 2020-11-24 LAB — SAMPLE TO BLOOD BANK

## 2020-11-25 ENCOUNTER — Inpatient Hospital Stay: Payer: No Typology Code available for payment source

## 2020-11-28 ENCOUNTER — Other Ambulatory Visit: Payer: No Typology Code available for payment source

## 2020-11-29 ENCOUNTER — Ambulatory Visit: Payer: No Typology Code available for payment source

## 2020-11-29 ENCOUNTER — Other Ambulatory Visit: Payer: No Typology Code available for payment source

## 2021-01-04 ENCOUNTER — Other Ambulatory Visit: Payer: Self-pay

## 2021-01-04 DIAGNOSIS — C9201 Acute myeloblastic leukemia, in remission: Secondary | ICD-10-CM

## 2021-01-06 ENCOUNTER — Inpatient Hospital Stay: Payer: No Typology Code available for payment source | Admitting: Oncology

## 2021-01-06 ENCOUNTER — Other Ambulatory Visit: Payer: Self-pay

## 2021-01-06 ENCOUNTER — Inpatient Hospital Stay: Payer: No Typology Code available for payment source | Attending: Hematology and Oncology

## 2021-01-06 ENCOUNTER — Other Ambulatory Visit: Payer: Self-pay | Admitting: *Deleted

## 2021-01-06 DIAGNOSIS — C9201 Acute myeloblastic leukemia, in remission: Secondary | ICD-10-CM | POA: Insufficient documentation

## 2021-01-06 DIAGNOSIS — Z79899 Other long term (current) drug therapy: Secondary | ICD-10-CM | POA: Diagnosis not present

## 2021-01-06 DIAGNOSIS — R7989 Other specified abnormal findings of blood chemistry: Secondary | ICD-10-CM | POA: Insufficient documentation

## 2021-01-06 LAB — CBC WITH DIFFERENTIAL/PLATELET
Abs Immature Granulocytes: 0.01 10*3/uL (ref 0.00–0.07)
Basophils Absolute: 0 10*3/uL (ref 0.0–0.1)
Basophils Relative: 1 %
Eosinophils Absolute: 0 10*3/uL (ref 0.0–0.5)
Eosinophils Relative: 0 %
HCT: 38.7 % — ABNORMAL LOW (ref 39.0–52.0)
Hemoglobin: 12.3 g/dL — ABNORMAL LOW (ref 13.0–17.0)
Immature Granulocytes: 0 %
Lymphocytes Relative: 50 %
Lymphs Abs: 1.4 10*3/uL (ref 0.7–4.0)
MCH: 29.1 pg (ref 26.0–34.0)
MCHC: 31.8 g/dL (ref 30.0–36.0)
MCV: 91.7 fL (ref 80.0–100.0)
Monocytes Absolute: 0.2 10*3/uL (ref 0.1–1.0)
Monocytes Relative: 8 %
Neutro Abs: 1.2 10*3/uL — ABNORMAL LOW (ref 1.7–7.7)
Neutrophils Relative %: 41 %
Platelets: 113 10*3/uL — ABNORMAL LOW (ref 150–400)
RBC: 4.22 MIL/uL (ref 4.22–5.81)
RDW: 19.1 % — ABNORMAL HIGH (ref 11.5–15.5)
WBC: 2.9 10*3/uL — ABNORMAL LOW (ref 4.0–10.5)
nRBC: 0 % (ref 0.0–0.2)

## 2021-01-06 LAB — COMPREHENSIVE METABOLIC PANEL
ALT: 126 U/L — ABNORMAL HIGH (ref 0–44)
AST: 58 U/L — ABNORMAL HIGH (ref 15–41)
Albumin: 4 g/dL (ref 3.5–5.0)
Alkaline Phosphatase: 95 U/L (ref 38–126)
Anion gap: 8 (ref 5–15)
BUN: 12 mg/dL (ref 6–20)
CO2: 25 mmol/L (ref 22–32)
Calcium: 9.1 mg/dL (ref 8.9–10.3)
Chloride: 102 mmol/L (ref 98–111)
Creatinine, Ser: 1.12 mg/dL (ref 0.61–1.24)
GFR, Estimated: >60 mL/min (ref >60)
Glucose, Bld: 196 mg/dL — ABNORMAL HIGH (ref 70–99)
Potassium: 4 mmol/L (ref 3.5–5.1)
Sodium: 135 mmol/L (ref 135–145)
Total Bilirubin: 0.6 mg/dL (ref 0.3–1.2)
Total Protein: 7.1 g/dL (ref 6.5–8.1)

## 2021-01-06 LAB — MAGNESIUM: Magnesium: 1.7 mg/dL (ref 1.7–2.4)

## 2021-01-09 NOTE — Progress Notes (Signed)
This guy needs follow-up with an oncologist at some point. I would say in the next couple weeks. He doesn't have anything scheduled.   Faythe Casa, NP 01/09/2021 9:17 AM

## 2021-01-17 ENCOUNTER — Other Ambulatory Visit: Payer: Self-pay

## 2021-01-17 ENCOUNTER — Telehealth: Payer: Self-pay | Admitting: Oncology

## 2021-01-17 DIAGNOSIS — C9201 Acute myeloblastic leukemia, in remission: Secondary | ICD-10-CM

## 2021-01-17 NOTE — Telephone Encounter (Signed)
I called the patient and let him know that we need to add labs on for tom. Appt and if e could arrive at 9:30. He had first forgot he had appt but then he remembered and he will be there at 9:30

## 2021-01-17 NOTE — Telephone Encounter (Signed)
Jonathon from [facility] left a VM stating they are requesting him come for labs at the [facility] location. He would like a call back to discuss that at [phone]

## 2021-01-18 ENCOUNTER — Inpatient Hospital Stay: Payer: No Typology Code available for payment source

## 2021-01-18 ENCOUNTER — Inpatient Hospital Stay (HOSPITAL_BASED_OUTPATIENT_CLINIC_OR_DEPARTMENT_OTHER): Payer: No Typology Code available for payment source | Admitting: Oncology

## 2021-01-18 ENCOUNTER — Encounter: Payer: Self-pay | Admitting: Oncology

## 2021-01-18 ENCOUNTER — Other Ambulatory Visit: Payer: Self-pay

## 2021-01-18 VITALS — BP 118/79 | HR 84 | Temp 96.5°F | Resp 16 | Wt 208.6 lb

## 2021-01-18 DIAGNOSIS — R945 Abnormal results of liver function studies: Secondary | ICD-10-CM

## 2021-01-18 DIAGNOSIS — C9201 Acute myeloblastic leukemia, in remission: Secondary | ICD-10-CM

## 2021-01-18 DIAGNOSIS — Z79899 Other long term (current) drug therapy: Secondary | ICD-10-CM

## 2021-01-18 DIAGNOSIS — R7989 Other specified abnormal findings of blood chemistry: Secondary | ICD-10-CM

## 2021-01-18 LAB — CBC WITH DIFFERENTIAL/PLATELET
Abs Immature Granulocytes: 0 10*3/uL (ref 0.00–0.07)
Basophils Absolute: 0 10*3/uL (ref 0.0–0.1)
Basophils Relative: 0 %
Eosinophils Absolute: 0 10*3/uL (ref 0.0–0.5)
Eosinophils Relative: 1 %
HCT: 37.4 % — ABNORMAL LOW (ref 39.0–52.0)
Hemoglobin: 11.8 g/dL — ABNORMAL LOW (ref 13.0–17.0)
Immature Granulocytes: 0 %
Lymphocytes Relative: 42 %
Lymphs Abs: 1 10*3/uL (ref 0.7–4.0)
MCH: 28.6 pg (ref 26.0–34.0)
MCHC: 31.6 g/dL (ref 30.0–36.0)
MCV: 90.8 fL (ref 80.0–100.0)
Monocytes Absolute: 0.1 10*3/uL (ref 0.1–1.0)
Monocytes Relative: 6 %
Neutro Abs: 1.2 10*3/uL — ABNORMAL LOW (ref 1.7–7.7)
Neutrophils Relative %: 51 %
Platelets: 153 10*3/uL (ref 150–400)
RBC: 4.12 MIL/uL — ABNORMAL LOW (ref 4.22–5.81)
RDW: 19.5 % — ABNORMAL HIGH (ref 11.5–15.5)
WBC: 2.4 10*3/uL — ABNORMAL LOW (ref 4.0–10.5)
nRBC: 0 % (ref 0.0–0.2)

## 2021-01-18 LAB — COMPREHENSIVE METABOLIC PANEL
ALT: 97 U/L — ABNORMAL HIGH (ref 0–44)
AST: 46 U/L — ABNORMAL HIGH (ref 15–41)
Albumin: 4 g/dL (ref 3.5–5.0)
Alkaline Phosphatase: 93 U/L (ref 38–126)
Anion gap: 6 (ref 5–15)
BUN: 17 mg/dL (ref 6–20)
CO2: 26 mmol/L (ref 22–32)
Calcium: 9.1 mg/dL (ref 8.9–10.3)
Chloride: 103 mmol/L (ref 98–111)
Creatinine, Ser: 1.17 mg/dL (ref 0.61–1.24)
GFR, Estimated: >60 mL/min (ref >60)
Glucose, Bld: 131 mg/dL — ABNORMAL HIGH (ref 70–99)
Potassium: 3.8 mmol/L (ref 3.5–5.1)
Sodium: 135 mmol/L (ref 135–145)
Total Bilirubin: 0.8 mg/dL (ref 0.3–1.2)
Total Protein: 7.1 g/dL (ref 6.5–8.1)

## 2021-01-18 NOTE — Progress Notes (Signed)
Pt was told his liver count was very high and pt is somewhat concerned because not sure where the high count can be coming from. As well as, pelvic area pain at times rates it 3/10.

## 2021-01-18 NOTE — Progress Notes (Signed)
Hematology/Oncology Consult note [facility]  [phone] [phone]  Patient Care Team: Lequita Asal, MD as PCP - General (Hematology and Oncology)   Name of the patient: Carl [name]  [id number]  10-Jul-1976   Date of visit: 01/18/21  Diagnosis-history of AML in remission  Chief complaint/ Reason for visit-follow-up of blood counts  Heme/Onc history: Carl [name]. 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 Gene Coding Predicted Protein Variant allele fraction  U2AF1 c.101C>T p.(Ser34Phe) 12.9 %  RUNX1 c.620_621insATCCCCCG p.(Gln208Serfs*6) 6.7 %  NRAS c.38G>A p.(Gly13Asp) 9.2 %   Variants of Unknown Clinical Significance:  Gene Coding Predicted Protein Variant allele fraction  TET2 c.4493G>A p.(Arg1498His) 48.2 %  ETV6 c.776G>A p.(Arg259Gln) 49.1 %   Pertinent Phenotypic data: blasts express CD7, CD13, CD34, CD38, CD71, CD117, and HLA-DR.  He received cytarabine and daunorubicin (7+3) beginning 04/13/2020 and 05/27/2020.  Course has been complicated by fever and neutropenia with mucositis (04/27/2020) and gluteal cleft cellulitis (05/06/2020).Received induction with 7+3+HD with delayed count recovery. D42 BMBx revealed persistent disease and patient re-induced with 7+3.  Carl [name] then went on to receive consolidation therapy with Hydrea and cycle 4 received on 11/04/2020.  Carl [name] follows up with Dr. Royce Macadamia at [facility]. Last bone marrow biopsy on 12/05/2020 which showed hypercellular bone marrow with trilineage hematopoiesis and 3% blasts by manual differential.  Carl [name] is presently on azacitidine p.o. 300 mg daily 2 weeks on 2 weeks off.  Carl [name] is also on Levaquin acyclovir prophylaxis.  Interval history-Carl [name] is presently doing well and tolerating azacitidine well.  Appetite and weight have remained stable.  Denies any fever.  ECOG PS- 1 Pain scale- 0  Review of systems- Review of Systems  Constitutional:  Negative for chills, fever, malaise/fatigue and weight loss.  HENT: Negative for congestion, ear discharge and nosebleeds.   Eyes: Negative for blurred vision.  Respiratory: Negative for cough, hemoptysis, sputum production, shortness of breath and wheezing.   Cardiovascular: Negative for chest pain, palpitations, orthopnea and claudication.  Gastrointestinal: Negative for abdominal pain, blood in stool, constipation, diarrhea, heartburn, melena, nausea and vomiting.  Genitourinary: Negative for dysuria, flank pain, frequency, hematuria and urgency.  Musculoskeletal: Negative for back pain, joint pain and myalgias.  Skin: Negative for rash.  Neurological: Negative for dizziness, tingling, focal weakness, seizures, weakness and headaches.  Endo/Heme/Allergies: Does not bruise/bleed easily.  Psychiatric/Behavioral: Negative for depression and suicidal ideas. The patient does not have insomnia.     Allergies  Allergen Reactions  . Dapsone     Contraindication - G6PD deficient  . Primaquine     Contraindication - G6PD deficient  . Rasburicase     Contraindication - G6PD deficient     Past Medical History:  Diagnosis Date  . Acute myeloid leukemia in remission ([place])   . Agranulocytosis secondary to cancer chemotherapy (CODE) ([place])   . Chemotherapy induced neutropenia ([place])   . Pancreatitis   . Pancytopenia ([place])   . Thrombocytopenia ([place])   . Tobacco use disorder      History reviewed. No pertinent surgical history.  Social History   Socioeconomic History  . Marital status: Single    Spouse name: Not on file  . Number of children: Not on file  . Years of education: Not on file  . Highest education level: Not on file  Occupational History  . Not on file  Tobacco Use  . Smoking status: Former Smoker    Packs/day: 1.00  Years: 17.00    Pack years: 17.00    Types: Cigarettes    Quit date: 03/27/2020    Years since quitting: 0.8  . Smokeless tobacco: Never Used  Vaping  Use  . Vaping Use: Never used  Substance and Sexual Activity  . Alcohol use: Not Currently    Alcohol/week: 6.0 standard drinks    Types: 6 Cans of beer per week    Comment: 6-24oz beers on weekly bases  . Drug use: Never    Types: Marijuana  . Sexual activity: Not on file  Other Topics Concern  . Not on file  Social History Narrative  . Not on file   Social Determinants of Health   Financial Resource Strain: Not on file  Food Insecurity: Not on file  Transportation Needs: Not on file  Physical Activity: Not on file  Stress: Not on file  Social Connections: Not on file  Intimate Partner Violence: Not on file    Family History  Problem Relation Age of Onset  . Prostate cancer Neg Hx   . Kidney cancer Neg Hx   . Bladder Cancer Neg Hx      Current Outpatient Medications:  .  azaCITIDine 300 MG TABS, Take by mouth., Disp: , Rfl:  .  cyanocobalamin 1000 MCG tablet, Take 1,000 mcg by mouth daily., Disp: , Rfl:  .  cyclobenzaprine (FLEXERIL) 10 MG tablet, Take 1 tablet (10 mg total) by mouth 3 (three) times daily as needed for muscle spasms., Disp: 30 tablet, Rfl: 0 .  levofloxacin (LEVAQUIN) 500 MG tablet, Take by mouth daily., Disp: , Rfl:  .  magnesium oxide (MAG-OX) 400 MG tablet, Take 800 mg by mouth daily., Disp: , Rfl:  .  Multiple Vitamin (MULTIVITAMIN WITH MINERALS) TABS tablet, Take 1 tablet by mouth daily., Disp: , Rfl:  .  ondansetron (ZOFRAN) 8 MG tablet, Take 1 tablet 30 minutes before each dose of Onureg. Can take additional doses every 8 hours as needed if having nausea., Disp: , Rfl:  .  prochlorperazine (COMPAZINE) 10 MG tablet, Take by mouth., Disp: , Rfl:  .  valACYclovir (VALTREX) 500 MG tablet, Take 500 mg by mouth daily., Disp: , Rfl:   Physical exam:  Vitals:   01/18/21 1031  BP: 118/79  Pulse: 84  Resp: 16  Temp: (!) 96.5 F (35.8 C)  SpO2: 100%  Weight: 208 lb 8.9 oz (94.6 kg)   Physical Exam Constitutional:      General: Carl [name] is not in acute  distress. Cardiovascular:     Rate and Rhythm: Normal rate and regular rhythm.     Heart sounds: Normal heart sounds.  Pulmonary:     Effort: Pulmonary effort is normal.     Breath sounds: Normal breath sounds.  Abdominal:     General: Bowel sounds are normal.     Palpations: Abdomen is soft.  Skin:    General: Skin is warm and dry.  Neurological:     Mental Status: Carl [name] is alert and oriented to person, place, and time.      CMP Latest Ref Rng & Units 01/18/2021  Glucose 70 - 99 mg/dL 131(H)  BUN 6 - 20 mg/dL 17  Creatinine 0.61 - 1.24 mg/dL 1.17  Sodium 135 - 145 mmol/L 135  Potassium 3.5 - 5.1 mmol/L 3.8  Chloride 98 - 111 mmol/L 103  CO2 22 - 32 mmol/L 26  Calcium 8.9 - 10.3 mg/dL 9.1  Total Protein 6.5 - 8.1 g/dL 7.1  Total  Bilirubin 0.3 - 1.2 mg/dL 0.8  Alkaline Phos 38 - 126 U/L 93  AST 15 - 41 U/L 46(H)  ALT 0 - 44 U/L 97(H)   CBC Latest Ref Rng & Units 01/18/2021  WBC 4.0 - 10.5 K/uL 2.4(L)  Hemoglobin 13.0 - 17.0 g/dL 11.8(L)  Hematocrit 39.0 - 52.0 % 37.4(L)  Platelets 150 - 400 K/uL 153      Assessment and plan- Patient is a 45 y.o. male with history of AML s/p induction chemo and 4 cycles of Hi DAC currently on maintenance azacitidine 2 weeks on 2 weeks off here for routine follow-up  AML: Patient also follows up with Dr. Royce Macadamia from [facility].  Carl [name] is presently on Vidaza 2 weeks on 2 weeks off.  This is currently his off week but Carl [name] is not exactly sure when Carl [name] supposed to start his Vidaza back on.  We will call the patient to confirm his dates and plan to get a CBC with differential a day prior to his starting Vidaza.  As long as ANC is greater than 0.5 and platelets greater than 50 Carl [name] can continue with Vidaza.  His white count is mildly lower as compared to before possibly secondary to [place].  Hemoglobin is overall stable and platelet counts are normal.  Continue to monitor.  Carl [name] will continue valacyclovir and levofloxacin prophylaxis  Repeat CBC with differential in 2  weeks 4 weeks in 6 weeks and I will see him back in 6 weeks   Visit Diagnosis 1. Acute myeloid leukemia in remission ([place])   2. High risk medication use   3. Abnormal LFTs      Dr. Randa Evens, MD, MPH [facility] at [facility] [phone] 01/18/2021 4:03 PM

## 2021-01-20 ENCOUNTER — Encounter: Payer: Self-pay | Admitting: Hematology and Oncology

## 2021-02-01 ENCOUNTER — Other Ambulatory Visit: Payer: Self-pay | Admitting: *Deleted

## 2021-02-01 ENCOUNTER — Inpatient Hospital Stay: Payer: No Typology Code available for payment source

## 2021-02-01 ENCOUNTER — Telehealth: Payer: Self-pay | Admitting: Internal Medicine

## 2021-02-01 ENCOUNTER — Inpatient Hospital Stay: Payer: No Typology Code available for payment source | Attending: Oncology

## 2021-02-01 ENCOUNTER — Other Ambulatory Visit: Payer: Self-pay

## 2021-02-01 DIAGNOSIS — C9211 Chronic myeloid leukemia, BCR/ABL-positive, in remission: Secondary | ICD-10-CM | POA: Insufficient documentation

## 2021-02-01 DIAGNOSIS — C9201 Acute myeloblastic leukemia, in remission: Secondary | ICD-10-CM

## 2021-02-01 DIAGNOSIS — D701 Agranulocytosis secondary to cancer chemotherapy: Secondary | ICD-10-CM | POA: Insufficient documentation

## 2021-02-01 LAB — COMPREHENSIVE METABOLIC PANEL
ALT: 170 U/L — ABNORMAL HIGH (ref 0–44)
AST: 67 U/L — ABNORMAL HIGH (ref 15–41)
Albumin: 4.4 g/dL (ref 3.5–5.0)
Alkaline Phosphatase: 104 U/L (ref 38–126)
Anion gap: 10 (ref 5–15)
BUN: 17 mg/dL (ref 6–20)
CO2: 27 mmol/L (ref 22–32)
Calcium: 9.5 mg/dL (ref 8.9–10.3)
Chloride: 98 mmol/L (ref 98–111)
Creatinine, Ser: 1.43 mg/dL — ABNORMAL HIGH (ref 0.61–1.24)
GFR, Estimated: >60 mL/min (ref >60)
Glucose, Bld: 126 mg/dL — ABNORMAL HIGH (ref 70–99)
Potassium: 4.1 mmol/L (ref 3.5–5.1)
Sodium: 135 mmol/L (ref 135–145)
Total Bilirubin: 0.7 mg/dL (ref 0.3–1.2)
Total Protein: 7.6 g/dL (ref 6.5–8.1)

## 2021-02-01 LAB — CBC WITH DIFFERENTIAL/PLATELET
Abs Immature Granulocytes: 0 10*3/uL (ref 0.00–0.07)
Basophils Absolute: 0 10*3/uL (ref 0.0–0.1)
Basophils Relative: 1 %
Eosinophils Absolute: 0 10*3/uL (ref 0.0–0.5)
Eosinophils Relative: 2 %
HCT: 38.4 % — ABNORMAL LOW (ref 39.0–52.0)
Hemoglobin: 12.4 g/dL — ABNORMAL LOW (ref 13.0–17.0)
Immature Granulocytes: 0 %
Lymphocytes Relative: 36 %
Lymphs Abs: 0.7 10*3/uL (ref 0.7–4.0)
MCH: 29.4 pg (ref 26.0–34.0)
MCHC: 32.3 g/dL (ref 30.0–36.0)
MCV: 91 fL (ref 80.0–100.0)
Monocytes Absolute: 0.1 10*3/uL (ref 0.1–1.0)
Monocytes Relative: 4 %
Neutro Abs: 1.1 10*3/uL — ABNORMAL LOW (ref 1.7–7.7)
Neutrophils Relative %: 57 %
Platelets: 188 10*3/uL (ref 150–400)
RBC: 4.22 MIL/uL (ref 4.22–5.81)
RDW: 20.6 % — ABNORMAL HIGH (ref 11.5–15.5)
WBC: 1.9 10*3/uL — ABNORMAL LOW (ref 4.0–10.5)
nRBC: 0 % (ref 0.0–0.2)

## 2021-02-01 NOTE — Telephone Encounter (Signed)
On 6/08-reviewed the labs including LFTs intermittently elevated.  Currently on oral azacitidine started on 6/06-ANC 1.1; hemoglobin platelets unremarkable.  ALT ~ x4; AST x2; normal bilirubin.   Recommend continuing oral azacitidine at this time.   Recommend repeating cbc/CMP; mag and Phos in 1 week.  With regards to palpitations-recommend checking heart rate/rhythm monitor; if worse recommend evaluation with [name] in the management clinic-would need further work-up including EKG.  GB

## 2021-02-02 ENCOUNTER — Telehealth: Payer: Self-pay | Admitting: *Deleted

## 2021-02-02 NOTE — Telephone Encounter (Signed)
I called Carl [name] at [facility] returning his call. I told him that I just got off the phone with pt today and spoke to him yest. Also. I told Carl [name] that the only issue he had yest. Was feeling like sometimes his heart would feel like it was fast and then it would go away. Tried to see if there was anyway to have some one check his pulse and he said no one knows how, asked about if he has apple watch and he said no, asked if pt can get pulse oximeter and that will check his pulse rate and he can get it at any pharmacy or [facility]. He will try to get one next week. I told him that if it continues or gets worse we will need to see him in the office. Today the pt feels good and having no sx. Carl [name] said that he would like pt to be seen next week and have labs to make sure he is going well. He feels that he needs to be checked on closely while on vidaza  orally and it is hard for him to get to [facility] due to transportation. We have added him on for next wed.

## 2021-02-02 NOTE — Telephone Encounter (Signed)
Called him twice today and it goes to voicemail and the voicemail is full and can't leave amessage. I will call later today and see if I can get in touch with him

## 2021-02-02 NOTE — Telephone Encounter (Signed)
Incoming call from [place] / Dr Rubye Beach office at [facility] asking if we could see this patient next week due to travel issues patient has for nausea and dizziness and just not feeling well in general. Jonathon requests a return call to let him know if we can accommodate this request [phone]

## 2021-02-03 ENCOUNTER — Encounter: Payer: Self-pay | Admitting: Hematology and Oncology

## 2021-02-08 ENCOUNTER — Telehealth: Payer: Self-pay | Admitting: *Deleted

## 2021-02-08 ENCOUNTER — Encounter: Payer: Self-pay | Admitting: Oncology

## 2021-02-08 ENCOUNTER — Inpatient Hospital Stay (HOSPITAL_BASED_OUTPATIENT_CLINIC_OR_DEPARTMENT_OTHER): Payer: No Typology Code available for payment source | Admitting: Oncology

## 2021-02-08 ENCOUNTER — Other Ambulatory Visit: Payer: Self-pay | Admitting: *Deleted

## 2021-02-08 ENCOUNTER — Other Ambulatory Visit: Payer: Self-pay

## 2021-02-08 VITALS — BP 120/81 | HR 79 | Temp 96.4°F | Resp 20 | Wt 210.1 lb

## 2021-02-08 DIAGNOSIS — D701 Agranulocytosis secondary to cancer chemotherapy: Secondary | ICD-10-CM

## 2021-02-08 DIAGNOSIS — T451X5A Adverse effect of antineoplastic and immunosuppressive drugs, initial encounter: Secondary | ICD-10-CM | POA: Diagnosis not present

## 2021-02-08 DIAGNOSIS — C9201 Acute myeloblastic leukemia, in remission: Secondary | ICD-10-CM | POA: Diagnosis not present

## 2021-02-08 DIAGNOSIS — C9211 Chronic myeloid leukemia, BCR/ABL-positive, in remission: Secondary | ICD-10-CM | POA: Diagnosis not present

## 2021-02-08 DIAGNOSIS — Z79899 Other long term (current) drug therapy: Secondary | ICD-10-CM

## 2021-02-08 LAB — COMPREHENSIVE METABOLIC PANEL
ALT: 97 U/L — ABNORMAL HIGH (ref 0–44)
AST: 43 U/L — ABNORMAL HIGH (ref 15–41)
Albumin: 4.2 g/dL (ref 3.5–5.0)
Alkaline Phosphatase: 92 U/L (ref 38–126)
Anion gap: 6 (ref 5–15)
BUN: 11 mg/dL (ref 6–20)
CO2: 27 mmol/L (ref 22–32)
Calcium: 9.2 mg/dL (ref 8.9–10.3)
Chloride: 99 mmol/L (ref 98–111)
Creatinine, Ser: 1.22 mg/dL (ref 0.61–1.24)
GFR, Estimated: >60 mL/min (ref >60)
Glucose, Bld: 145 mg/dL — ABNORMAL HIGH (ref 70–99)
Potassium: 3.7 mmol/L (ref 3.5–5.1)
Sodium: 132 mmol/L — ABNORMAL LOW (ref 135–145)
Total Bilirubin: 0.9 mg/dL (ref 0.3–1.2)
Total Protein: 7.3 g/dL (ref 6.5–8.1)

## 2021-02-08 LAB — CBC WITH DIFFERENTIAL/PLATELET
Abs Immature Granulocytes: 0 10*3/uL (ref 0.00–0.07)
Basophils Absolute: 0 10*3/uL (ref 0.0–0.1)
Basophils Relative: 1 %
Eosinophils Absolute: 0 10*3/uL (ref 0.0–0.5)
Eosinophils Relative: 1 %
HCT: 38.4 % — ABNORMAL LOW (ref 39.0–52.0)
Hemoglobin: 12.7 g/dL — ABNORMAL LOW (ref 13.0–17.0)
Immature Granulocytes: 0 %
Lymphocytes Relative: 47 %
Lymphs Abs: 0.7 10*3/uL (ref 0.7–4.0)
MCH: 29.4 pg (ref 26.0–34.0)
MCHC: 33.1 g/dL (ref 30.0–36.0)
MCV: 88.9 fL (ref 80.0–100.0)
Monocytes Absolute: 0.2 10*3/uL (ref 0.1–1.0)
Monocytes Relative: 12 %
Neutro Abs: 0.6 10*3/uL — ABNORMAL LOW (ref 1.7–7.7)
Neutrophils Relative %: 39 %
Platelets: 203 10*3/uL (ref 150–400)
RBC: 4.32 MIL/uL (ref 4.22–5.81)
RDW: 20.3 % — ABNORMAL HIGH (ref 11.5–15.5)
WBC: 1.5 10*3/uL — ABNORMAL LOW (ref 4.0–10.5)
nRBC: 1.4 % — ABNORMAL HIGH (ref 0.0–0.2)

## 2021-02-08 NOTE — Progress Notes (Signed)
Hematology/Oncology Consult note [facility]  Telephone:([phone] [phone]  Patient Care Team: [name], MD as Consulting Physician (Oncology)   Name of the patient: Carl [name]  [id number]  [birth year]   Date of visit: 02/08/21  Diagnosis- history of AML in remission  Chief complaint/ Reason for visit-routine follow-up of AML on Vidaza  Heme/Onc history: Elimelech Jamicheal Heard. 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 Gene Coding Predicted Protein Variant allele fraction  U2AF1 c.101C>T p.(Ser34Phe) 12.9 %  RUNX1 c.620_621insATCCCCCG p.(Gln208Serfs*6) 6.7 %  NRAS c.38G>A p.(Gly13Asp) 9.2 %   Variants of Unknown Clinical Significance:  Gene Coding Predicted Protein Variant allele fraction  TET2 c.4493G>A p.(Arg1498His) 48.2 %  ETV6 c.776G>A p.(Arg259Gln) 49.1 %   Pertinent Phenotypic data: blasts express CD7, CD13, CD34, CD38, CD71, CD117, and HLA-DR.   He received cytarabine and daunorubicin (7+3) beginning 04/13/2020 and 05/27/2020.  Course has been complicated by fever and neutropenia with mucositis (04/27/2020) and gluteal cleft cellulitis (05/06/2020).Received induction with 7+3+HD with delayed count recovery. D42 BMBx revealed persistent disease and patient re-induced with 7+3.  He then went on to receive consolidation therapy with HiDAC and cycle 4 received on 11/04/2020.  He follows up with Dr. Royce Macadamia at [facility]. Last bone marrow biopsy on 12/05/2020 which showed hypercellular bone marrow with trilineage hematopoiesis and 3% blasts by manual differential.   He is presently on azacitidine p.o. 300 mg daily 2 weeks on 2 weeks off.  He is also on Levaquin acyclovir prophylaxis.    Interval history-patient states that he had an episode of palpitation about 2 days ago when he felt as if his heart was racing.  States that he does not have any other symptoms presently.  Denies any chest pain or exertional  shortness of breath.  Denies any fever.  He has received his COVID booster.  He plans to go to [facility] next week.  ECOG PS- 1 Pain scale- 0   Review of systems- Review of Systems  Constitutional:  Negative for chills, fever, malaise/fatigue and weight loss.  HENT:  Negative for congestion, ear discharge and nosebleeds.   Eyes:  Negative for blurred vision.  Respiratory:  Negative for cough, hemoptysis, sputum production, shortness of breath and wheezing.   Cardiovascular:  Negative for chest pain, palpitations, orthopnea and claudication.  Gastrointestinal:  Negative for abdominal pain, blood in stool, constipation, diarrhea, heartburn, melena, nausea and vomiting.  Genitourinary:  Negative for dysuria, flank pain, frequency, hematuria and urgency.  Musculoskeletal:  Negative for back pain, joint pain and myalgias.  Skin:  Negative for rash.  Neurological:  Negative for dizziness, tingling, focal weakness, seizures, weakness and headaches.  Endo/Heme/Allergies:  Does not bruise/bleed easily.  Psychiatric/Behavioral:  Negative for depression and suicidal ideas. The patient does not have insomnia.      Allergies  Allergen Reactions   Dapsone     Contraindication - G6PD deficient   Primaquine     Contraindication - G6PD deficient   Rasburicase     Contraindication - G6PD deficient     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[place])    Agranulocytosis secondary to cancer chemotherapy (CODE) ([place])    Chemotherapy induced neutropenia (HCC)    Pancreatitis    Pancytopenia (HCC)    Thrombocytopenia (HCC)    Tobacco use disorder      History reviewed. No pertinent surgical history.  Social History  Socioeconomic History   Marital status: Single    Spouse name: Not on file   Number of children: Not on file   Years of education: Not on file   Highest education level: Not on file  Occupational History   Not on file  Tobacco Use    Smoking status: Former    Packs/day: 1.00    Years: 17.00    Pack years: 17.00    Types: Cigarettes    Quit date: 03/27/2020    Years since quitting: 0.8   Smokeless tobacco: Never  Vaping Use   Vaping Use: Never used  Substance and Sexual Activity   Alcohol use: Not Currently    Alcohol/week: 6.0 standard drinks    Types: 6 Cans of beer per week    Comment: 6-24oz beers on weekly bases   Drug use: Never    Types: Marijuana   Sexual activity: Not on file  Other Topics Concern   Not on file  Social History Narrative   Not on file   Social Determinants of Health   Financial Resource Strain: Not on file  Food Insecurity: Not on file  Transportation Needs: Not on file  Physical Activity: Not on file  Stress: Not on file  Social Connections: Not on file  Intimate Partner Violence: Not on file    Family History  Problem Relation Age of Onset   Prostate cancer Neg Hx    Kidney cancer Neg Hx    Bladder Cancer Neg Hx      Current Outpatient Medications:    azaCITIDine 300 MG TABS, Take by mouth., Disp: , Rfl:    cyanocobalamin 1000 MCG tablet, Take 1,000 mcg by mouth daily., Disp: , Rfl:    Multiple Vitamin (MULTIVITAMIN WITH MINERALS) TABS tablet, Take 1 tablet by mouth daily., Disp: , Rfl:    ondansetron (ZOFRAN) 8 MG tablet, Take 1 tablet 30 minutes before each dose of Onureg. Can take additional doses every 8 hours as needed if having nausea., Disp: , Rfl:    valACYclovir (VALTREX) 500 MG tablet, Take 500 mg by mouth daily., Disp: , Rfl:    cyclobenzaprine (FLEXERIL) 10 MG tablet, Take 1 tablet (10 mg total) by mouth 3 (three) times daily as needed for muscle spasms. (Patient not taking: Reported on 02/08/2021), Disp: 30 tablet, Rfl: 0   levofloxacin (LEVAQUIN) 500 MG tablet, Take by mouth daily. (Patient not taking: Reported on 02/08/2021), Disp: , Rfl:    magnesium oxide (MAG-OX) 400 MG tablet, Take 800 mg by mouth daily. (Patient not taking: Reported on 02/08/2021), Disp: ,  Rfl:    prochlorperazine (COMPAZINE) 10 MG tablet, Take by mouth. (Patient not taking: Reported on 02/08/2021), Disp: , Rfl:   Physical exam:  Vitals:   02/08/21 0952  BP: 120/81  Pulse: 79  Resp: 20  Temp: (!) 96.4 F (35.8 C)  SpO2: 100%  Weight: 210 lb 1.6 oz (95.3 kg)   Physical Exam HENT:     Mouth/Throat:     Mouth: Mucous membranes are moist.     Pharynx: Oropharynx is clear.  Cardiovascular:     Rate and Rhythm: Normal rate and regular rhythm.     Heart sounds: Normal heart sounds.  Pulmonary:     Effort: Pulmonary effort is normal.     Breath sounds: Normal breath sounds.  Abdominal:     General: Bowel sounds are normal.     Palpations: Abdomen is soft.  Skin:    General: Skin is warm and  dry.  Neurological:     Mental Status: He is alert and oriented to person, place, and time.     CMP Latest Ref Rng & Units 02/08/2021  Glucose 70 - 99 mg/dL 145(H)  BUN 6 - 20 mg/dL 11  Creatinine 0.61 - 1.24 mg/dL 1.22  Sodium 135 - 145 mmol/L 132(L)  Potassium 3.5 - 5.1 mmol/L 3.7  Chloride 98 - 111 mmol/L 99  CO2 22 - 32 mmol/L 27  Calcium 8.9 - 10.3 mg/dL 9.2  Total Protein 6.5 - 8.1 g/dL 7.3  Total Bilirubin 0.3 - 1.2 mg/dL 0.9  Alkaline Phos 38 - 126 U/L 92  AST 15 - 41 U/L 43(H)  ALT 0 - 44 U/L 97(H)   CBC Latest Ref Rng & Units 02/08/2021  WBC 4.0 - 10.5 K/uL 1.5(L)  Hemoglobin 13.0 - 17.0 g/dL 12.7(L)  Hematocrit 39.0 - 52.0 % 38.4(L)  Platelets 150 - 400 K/uL 203     Assessment and plan- Patient is a 45 y.o. male with history of AML s/p induction chemo and 4 cycles of Hi DAC currently on maintenance azacitidine 2 weeks on 2 weeks off.  He is here for routine follow-up  AML currently on oral Vidaza: White cell count continues to trend down and is down to 1.5 today with an ANC of 0.6. Patient started cycle 2 of Vidaza on 01/30/2021 and was supposed to finish it this week on 02/10/2021.  I would like him to hold his Vidaza for the next 2 days given that his [place]  is trending down.  We will continue to remain on levofloxacin and valacyclovir prophylaxis.  He has an upcoming telemedicine appointment with [facility] on 02/22/2021 and I will plan to bring him for labs on 02/21/2021.  Based on those labs we will decide if patient can resume cycle 3 of Vidaza starting 02/27/2021.  Neutropenic precautions reviewed.  We will also touch base with you and see if there would be okay with the patient getting Evushield.  He has had COVID in January 2022  Abnormal LFTs: This is chronic since he received his consolidation chemotherapy HiDAC at [facility].  Overall numbers look stable without clear rising trend.  Continue to monitor  I will see him back in 3 weeks when he starts cycle 3 of Vidaza  Today patient does not complain of any chest pain exertional shortness of breath or palpitations.  His resting heart rate was79 and regular.  I have encouraged him to have a pulse oximeter at home to monitor his heart rate if he were to experience any recurrent episodes of palpitations in the future.  Patient states that he cannot afford to buy a pulse ox at this time.  He will call us if his symptoms recur   Visit Diagnosis 1. Acute myeloid leukemia in remission ([place])   2. High risk medication use   3. Chemotherapy induced neutropenia (HCC)      Dr. Randa Evens, MD, MPH [facility] at [facility] [phone] 02/08/2021 12:18 PM

## 2021-02-08 NOTE — Progress Notes (Signed)
Pt states the medication azacitdine makes him "funny and weird feels like his adrenaline is rushing."

## 2021-02-08 NOTE — Telephone Encounter (Signed)
Dr. Janese Banks wanted me to call and check if pt should continue the azacitidine since anc 0.6. called and spoke to [place] with dr. Royce Macadamia office and he said that he checked with Dr. Royce Macadamia and he would let him finish the cycle. I called pt and let him  know that Dr. Royce Macadamia and Dr. Janese Banks agree on continuing his med and will end on Sunday 6/19. Pt agreeable to continue the med and call if he has an issues

## 2021-02-15 ENCOUNTER — Inpatient Hospital Stay: Payer: No Typology Code available for payment source

## 2021-02-20 ENCOUNTER — Telehealth: Payer: Self-pay

## 2021-02-21 ENCOUNTER — Other Ambulatory Visit: Payer: Self-pay

## 2021-02-21 ENCOUNTER — Inpatient Hospital Stay: Payer: No Typology Code available for payment source | Attending: Oncology

## 2021-02-21 ENCOUNTER — Inpatient Hospital Stay: Payer: No Typology Code available for payment source

## 2021-02-21 DIAGNOSIS — C9211 Chronic myeloid leukemia, BCR/ABL-positive, in remission: Secondary | ICD-10-CM | POA: Diagnosis not present

## 2021-02-21 DIAGNOSIS — C9201 Acute myeloblastic leukemia, in remission: Secondary | ICD-10-CM

## 2021-02-21 LAB — COMPREHENSIVE METABOLIC PANEL
ALT: 80 U/L — ABNORMAL HIGH (ref 0–44)
AST: 40 U/L (ref 15–41)
Albumin: 4.2 g/dL (ref 3.5–5.0)
Alkaline Phosphatase: 102 U/L (ref 38–126)
Anion gap: 7 (ref 5–15)
BUN: 15 mg/dL (ref 6–20)
CO2: 27 mmol/L (ref 22–32)
Calcium: 9.3 mg/dL (ref 8.9–10.3)
Chloride: 102 mmol/L (ref 98–111)
Creatinine, Ser: 1.29 mg/dL — ABNORMAL HIGH (ref 0.61–1.24)
GFR, Estimated: >60 mL/min (ref >60)
Glucose, Bld: 112 mg/dL — ABNORMAL HIGH (ref 70–99)
Potassium: 4.1 mmol/L (ref 3.5–5.1)
Sodium: 136 mmol/L (ref 135–145)
Total Bilirubin: 0.9 mg/dL (ref 0.3–1.2)
Total Protein: 7.2 g/dL (ref 6.5–8.1)

## 2021-02-21 LAB — CBC WITH DIFFERENTIAL/PLATELET
Abs Immature Granulocytes: 0.01 10*3/uL (ref 0.00–0.07)
Basophils Absolute: 0 10*3/uL (ref 0.0–0.1)
Basophils Relative: 1 %
Eosinophils Absolute: 0 10*3/uL (ref 0.0–0.5)
Eosinophils Relative: 1 %
HCT: 36.8 % — ABNORMAL LOW (ref 39.0–52.0)
Hemoglobin: 12.2 g/dL — ABNORMAL LOW (ref 13.0–17.0)
Immature Granulocytes: 1 %
Lymphocytes Relative: 33 %
Lymphs Abs: 0.7 10*3/uL (ref 0.7–4.0)
MCH: 29.5 pg (ref 26.0–34.0)
MCHC: 33.2 g/dL (ref 30.0–36.0)
MCV: 89.1 fL (ref 80.0–100.0)
Monocytes Absolute: 0.1 10*3/uL (ref 0.1–1.0)
Monocytes Relative: 7 %
Neutro Abs: 1.2 10*3/uL — ABNORMAL LOW (ref 1.7–7.7)
Neutrophils Relative %: 57 %
Platelets: 173 10*3/uL (ref 150–400)
RBC: 4.13 MIL/uL — ABNORMAL LOW (ref 4.22–5.81)
RDW: 20.6 % — ABNORMAL HIGH (ref 11.5–15.5)
WBC: 2 10*3/uL — ABNORMAL LOW (ref 4.0–10.5)
nRBC: 1 % — ABNORMAL HIGH (ref 0.0–0.2)

## 2021-03-01 ENCOUNTER — Inpatient Hospital Stay: Payer: No Typology Code available for payment source

## 2021-03-01 ENCOUNTER — Other Ambulatory Visit: Payer: Self-pay

## 2021-03-01 ENCOUNTER — Encounter: Payer: Self-pay | Admitting: Nurse Practitioner

## 2021-03-01 ENCOUNTER — Ambulatory Visit (HOSPITAL_BASED_OUTPATIENT_CLINIC_OR_DEPARTMENT_OTHER): Payer: No Typology Code available for payment source | Admitting: Nurse Practitioner

## 2021-03-01 ENCOUNTER — Inpatient Hospital Stay: Payer: No Typology Code available for payment source | Attending: Oncology

## 2021-03-01 VITALS — BP 119/73 | HR 80 | Temp 98.5°F | Resp 18 | Wt 210.0 lb

## 2021-03-01 DIAGNOSIS — Z79899 Other long term (current) drug therapy: Secondary | ICD-10-CM | POA: Insufficient documentation

## 2021-03-01 DIAGNOSIS — C9201 Acute myeloblastic leukemia, in remission: Secondary | ICD-10-CM | POA: Diagnosis not present

## 2021-03-01 DIAGNOSIS — R609 Edema, unspecified: Secondary | ICD-10-CM | POA: Diagnosis not present

## 2021-03-01 LAB — CBC WITH DIFFERENTIAL/PLATELET
Abs Immature Granulocytes: 0 10*3/uL (ref 0.00–0.07)
Basophils Absolute: 0.1 10*3/uL (ref 0.0–0.1)
Basophils Relative: 3 %
Eosinophils Absolute: 0 10*3/uL (ref 0.0–0.5)
Eosinophils Relative: 1 %
HCT: 39.4 % (ref 39.0–52.0)
Hemoglobin: 12.9 g/dL — ABNORMAL LOW (ref 13.0–17.0)
Immature Granulocytes: 0 %
Lymphocytes Relative: 26 %
Lymphs Abs: 0.5 10*3/uL — ABNORMAL LOW (ref 0.7–4.0)
MCH: 29.9 pg (ref 26.0–34.0)
MCHC: 32.7 g/dL (ref 30.0–36.0)
MCV: 91.4 fL (ref 80.0–100.0)
Monocytes Absolute: 0.1 10*3/uL (ref 0.1–1.0)
Monocytes Relative: 5 %
Neutro Abs: 1.3 10*3/uL — ABNORMAL LOW (ref 1.7–7.7)
Neutrophils Relative %: 65 %
Platelets: 258 10*3/uL (ref 150–400)
RBC: 4.31 MIL/uL (ref 4.22–5.81)
RDW: 20.6 % — ABNORMAL HIGH (ref 11.5–15.5)
WBC: 2 10*3/uL — ABNORMAL LOW (ref 4.0–10.5)
nRBC: 0 % (ref 0.0–0.2)

## 2021-03-01 LAB — COMPREHENSIVE METABOLIC PANEL
ALT: 110 U/L — ABNORMAL HIGH (ref 0–44)
AST: 51 U/L — ABNORMAL HIGH (ref 15–41)
Albumin: 4.3 g/dL (ref 3.5–5.0)
Alkaline Phosphatase: 93 U/L (ref 38–126)
Anion gap: 8 (ref 5–15)
BUN: 12 mg/dL (ref 6–20)
CO2: 25 mmol/L (ref 22–32)
Calcium: 9 mg/dL (ref 8.9–10.3)
Chloride: 102 mmol/L (ref 98–111)
Creatinine, Ser: 1.06 mg/dL (ref 0.61–1.24)
GFR, Estimated: >60 mL/min (ref >60)
Glucose, Bld: 109 mg/dL — ABNORMAL HIGH (ref 70–99)
Potassium: 3.8 mmol/L (ref 3.5–5.1)
Sodium: 135 mmol/L (ref 135–145)
Total Bilirubin: 1 mg/dL (ref 0.3–1.2)
Total Protein: 7.3 g/dL (ref 6.5–8.1)

## 2021-03-01 NOTE — Progress Notes (Signed)
Hematology/Oncology Consult Note [facility]  Telephone:([phone] [phone]  Patient Care Team: [name], MD as Consulting Physician (Oncology)   Name of the patient: Carl [name]  [id number]  02-07-1976   Date of visit: 03/01/21  Diagnosis- history of AML in remission  Chief complaint/ Reason for visit-routine follow-up of AML on Vidaza  Heme/Onc history: Carl [name]. 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 Gene Coding Predicted Protein Variant allele fraction  U2AF1 c.101C>T p.(Ser34Phe) 12.9 %  RUNX1 c.620_621insATCCCCCG p.(Gln208Serfs*6) 6.7 %  NRAS c.38G>A p.(Gly13Asp) 9.2 %   Variants of Unknown Clinical Significance:  Gene Coding Predicted Protein Variant allele fraction  TET2 c.4493G>A p.(Arg1498His) 48.2 %  ETV6 c.776G>A p.(Arg259Gln) 49.1 %   Pertinent Phenotypic data: blasts express CD7, CD13, CD34, CD38, CD71, CD117, and HLA-DR.   He received cytarabine and daunorubicin (7+3) beginning 04/13/2020 and 05/27/2020.  Course has been complicated by fever and neutropenia with mucositis (04/27/2020) and gluteal cleft cellulitis (05/06/2020).Received induction with 7+3+HD with delayed count recovery. D42 BMBx revealed persistent disease and patient re-induced with 7+3.  He then went on to receive consolidation therapy with HiDAC and cycle 4 received on 11/04/2020.  He follows up with Dr. Royce Macadamia at [facility]. Last bone marrow biopsy on 12/05/2020 which showed hypercellular bone marrow with trilineage hematopoiesis and 3% blasts by manual differential.   He is presently on azacitidine p.o. 300 mg daily 2 weeks on 2 weeks off.  He is also on Levaquin acyclovir prophylaxis.    Interval history-patient is 45 year old male with above history of AML, in remission, currently on Vidaza, who returns to clinic for routine follow-up.  Denies chest pain or shortness of breath.  No dizziness or falls.  No abdominal  pain, nausea, vomiting, diarrhea, or constipation.  No urinary complaints.  No abnormal bleeding or bruising.  No fevers or chills or interval infections.  He started next cycle of Vidaza yesterday on 02/28/2021.  Has some swelling of the low feet, not painful. No additional episodes of palpitations.  He is traveling to [place] later today.  ECOG PS- 1 Pain scale- 0  Review of systems- Review of Systems  Constitutional:  Negative for chills, fever, malaise/fatigue and weight loss.  HENT:  Negative for congestion, ear discharge and nosebleeds.   Eyes:  Negative for blurred vision.  Respiratory:  Negative for cough, hemoptysis, sputum production, shortness of breath and wheezing.   Cardiovascular:  Positive for leg swelling (pedal edema). Negative for chest pain, palpitations, orthopnea and claudication.  Gastrointestinal:  Negative for abdominal pain, blood in stool, constipation, diarrhea, heartburn, melena, nausea and vomiting.  Genitourinary:  Negative for dysuria, flank pain, frequency, hematuria and urgency.  Musculoskeletal:  Negative for back pain, joint pain and myalgias.  Skin:  Negative for rash.  Neurological:  Negative for dizziness, tingling, focal weakness, seizures, weakness and headaches.  Endo/Heme/Allergies:  Does not bruise/bleed easily.  Psychiatric/Behavioral:  Negative for depression and suicidal ideas. The patient is nervous/anxious. The patient does not have insomnia.      Allergies  Allergen Reactions   Dapsone     Contraindication - G6PD deficient   Primaquine     Contraindication - G6PD deficient   Rasburicase     Contraindication - G6PD deficient   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[place])    Agranulocytosis secondary to cancer chemotherapy (CODE) ([place])    Chemotherapy induced neutropenia ([place])  Pancreatitis    Pancytopenia (HCC)    Thrombocytopenia (HCC)    Tobacco use disorder    No past surgical history on  file.  Social History   Socioeconomic History   Marital status: Single    Spouse name: Not on file   Number of children: Not on file   Years of education: Not on file   Highest education level: Not on file  Occupational History   Not on file  Tobacco Use   Smoking status: Former    Packs/day: 1.00    Years: 17.00    Pack years: 17.00    Types: Cigarettes    Quit date: 03/27/2020    Years since quitting: 0.9   Smokeless tobacco: Never  Vaping Use   Vaping Use: Never used  Substance and Sexual Activity   Alcohol use: Not Currently    Alcohol/week: 6.0 standard drinks    Types: 6 Cans of beer per week    Comment: 6-24oz beers on weekly bases   Drug use: Never    Types: Marijuana   Sexual activity: Not on file  Other Topics Concern   Not on file  Social History Narrative   Not on file   Social Determinants of Health   Financial Resource Strain: Not on file  Food Insecurity: Not on file  Transportation Needs: Not on file  Physical Activity: Not on file  Stress: Not on file  Social Connections: Not on file  Intimate Partner Violence: Not on file    Family History  Problem Relation Age of Onset   Prostate cancer Neg Hx    Kidney cancer Neg Hx    Bladder Cancer Neg Hx     Current Outpatient Medications:    azaCITIDine 300 MG TABS, Take by mouth., Disp: , Rfl:    cyanocobalamin 1000 MCG tablet, Take 1,000 mcg by mouth daily., Disp: , Rfl:    cyclobenzaprine (FLEXERIL) 10 MG tablet, Take 1 tablet (10 mg total) by mouth 3 (three) times daily as needed for muscle spasms. (Patient not taking: Reported on 02/08/2021), Disp: 30 tablet, Rfl: 0   levofloxacin (LEVAQUIN) 500 MG tablet, Take by mouth daily. (Patient not taking: Reported on 02/08/2021), Disp: , Rfl:    magnesium oxide (MAG-OX) 400 MG tablet, Take 800 mg by mouth daily. (Patient not taking: Reported on 02/08/2021), Disp: , Rfl:    Multiple Vitamin (MULTIVITAMIN WITH MINERALS) TABS tablet, Take 1 tablet by mouth  daily., Disp: , Rfl:    ondansetron (ZOFRAN) 8 MG tablet, Take 1 tablet 30 minutes before each dose of Onureg. Can take additional doses every 8 hours as needed if having nausea., Disp: , Rfl:    prochlorperazine (COMPAZINE) 10 MG tablet, Take by mouth. (Patient not taking: Reported on 02/08/2021), Disp: , Rfl:    valACYclovir (VALTREX) 500 MG tablet, Take 500 mg by mouth daily., Disp: , Rfl:   Physical exam:  Vitals:   03/01/21 1448  BP: 119/73  Pulse: 80  Resp: 18  Temp: 98.5 F (36.9 C)  TempSrc: Tympanic  SpO2: 100%  Weight: 210 lb (95.3 kg)    Physical Exam Constitutional:      General: He is not in acute distress.    Appearance: He is well-developed.  HENT:     Head: Normocephalic and atraumatic.     Nose: Nose normal.     Mouth/Throat:     Mouth: Mucous membranes are moist.     Pharynx: Oropharynx is clear. No oropharyngeal exudate.  Eyes:  General: No scleral icterus.    Conjunctiva/sclera: Conjunctivae normal.  Cardiovascular:     Rate and Rhythm: Normal rate and regular rhythm.     Heart sounds: Normal heart sounds.  Pulmonary:     Effort: Pulmonary effort is normal.     Breath sounds: Normal breath sounds. No wheezing.  Abdominal:     General: Bowel sounds are normal. There is no distension.     Palpations: Abdomen is soft.     Tenderness: There is no abdominal tenderness.  Musculoskeletal:        General: No signs of injury. Normal range of motion.     Cervical back: Normal range of motion and neck supple.     Right lower leg: No edema.     Left lower leg: Edema (mild pedal edema. no warmth, redness, or pain.) present.  Skin:    General: Skin is warm and dry.  Neurological:     Mental Status: He is alert and oriented to person, place, and time.  Psychiatric:        Behavior: Behavior normal.     CMP Latest Ref Rng & Units 03/01/2021  Glucose 70 - 99 mg/dL 109(H)  BUN 6 - 20 mg/dL 12  Creatinine 0.61 - 1.24 mg/dL 1.06  Sodium 135 - 145 mmol/L 135   Potassium 3.5 - 5.1 mmol/L 3.8  Chloride 98 - 111 mmol/L 102  CO2 22 - 32 mmol/L 25  Calcium 8.9 - 10.3 mg/dL 9.0  Total Protein 6.5 - 8.1 g/dL 7.3  Total Bilirubin 0.3 - 1.2 mg/dL 1.0  Alkaline Phos 38 - 126 U/L 93  AST 15 - 41 U/L 51(H)  ALT 0 - 44 U/L 110(H)   CBC Latest Ref Rng & Units 03/01/2021  WBC 4.0 - 10.5 K/uL 2.0(L)  Hemoglobin 13.0 - 17.0 g/dL 12.9(L)  Hematocrit 39.0 - 52.0 % 39.4  Platelets 150 - 400 K/uL 258    Assessment and plan- Patient is a 45 y.o. 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  He returns to clinic for evaluation and starting cycle 3 of maintenance azacitidine.  He started cycle 3 on 02/28/2021.  Plan for 14 days on, 14 days off.  ANC is stable today at 1.3.  He will continue levofloxacin and acyclovir prophylaxis. Continue neutropenic precautions.   Palpitations-2 transient episodes of palpitations previously.  Asymptomatic more recently.  If symptoms recur, consider work-up with cardiology.  Transaminitis-chronic since he received his consolidation chemotherapy/HiDAC at [facility]. Stable. Monitor.   Anemia & Thrombocytopenia-would recommend transfusion 2 units for hemoglobin < 8. Platelet transfusion 1 unit of platelets for platelet count < 10 or bleeding, or invasive procedure. Patient needs leukoreduced blood products, irradiated preferred.   Covid prophylaxis-patient has received his COVID vaccines.  Also recommend Evusheld. Will send referral.   Pedal edema- unilateral. Question dependent edema. Monitor.   Follow up with Dr. Janese Banks as scheduled for mid cycle count check.   Visit Diagnosis 1. Acute myeloid leukemia in remission ([place])    Beckey Rutter, [place], AGNP-C [place] at [facility] [phone] (clinic)

## 2021-03-01 NOTE — Progress Notes (Signed)
Patient here for oncology follow-up appointment, expresses concerns of palpations and leg swelling

## 2021-03-05 ENCOUNTER — Encounter: Payer: Self-pay | Admitting: Nurse Practitioner

## 2021-03-06 NOTE — Telephone Encounter (Signed)
Will assess on 7/13 when I see him

## 2021-03-07 ENCOUNTER — Telehealth: Payer: Self-pay | Admitting: Adult Health

## 2021-03-07 NOTE — Telephone Encounter (Signed)
I called patient to discuss Evusheld, a long acting monoclonal antibody injection administered to patients with decreased immune systems or intolerance/allergy to the COVID 19 vaccine as COVID19 prevention.    I reviewed the injection with him in detail.  He says that he has an appointment to meet with his clinic tomorrow and wants some more work up about his swelling and liver before he agrees to receive the injection.  Once he is cleared by the clinicians tomorrow, he is agreeable to proceed with the Evusheld.    Wilber Bihari, NP

## 2021-03-08 ENCOUNTER — Other Ambulatory Visit: Payer: Self-pay

## 2021-03-08 ENCOUNTER — Encounter: Payer: Self-pay | Admitting: Oncology

## 2021-03-08 ENCOUNTER — Inpatient Hospital Stay: Payer: No Typology Code available for payment source

## 2021-03-08 ENCOUNTER — Inpatient Hospital Stay (HOSPITAL_BASED_OUTPATIENT_CLINIC_OR_DEPARTMENT_OTHER): Payer: No Typology Code available for payment source | Admitting: Oncology

## 2021-03-08 VITALS — BP 114/66 | HR 65 | Temp 97.6°F | Resp 20 | Wt 211.0 lb

## 2021-03-08 DIAGNOSIS — C9201 Acute myeloblastic leukemia, in remission: Secondary | ICD-10-CM

## 2021-03-08 DIAGNOSIS — Z79899 Other long term (current) drug therapy: Secondary | ICD-10-CM | POA: Diagnosis not present

## 2021-03-08 DIAGNOSIS — R600 Localized edema: Secondary | ICD-10-CM | POA: Diagnosis not present

## 2021-03-08 LAB — CBC WITH DIFFERENTIAL/PLATELET
Abs Immature Granulocytes: 0 10*3/uL (ref 0.00–0.07)
Basophils Absolute: 0.1 10*3/uL (ref 0.0–0.1)
Basophils Relative: 3 %
Eosinophils Absolute: 0 10*3/uL (ref 0.0–0.5)
Eosinophils Relative: 2 %
HCT: 37.3 % — ABNORMAL LOW (ref 39.0–52.0)
Hemoglobin: 12.2 g/dL — ABNORMAL LOW (ref 13.0–17.0)
Immature Granulocytes: 0 %
Lymphocytes Relative: 35 %
Lymphs Abs: 0.7 10*3/uL (ref 0.7–4.0)
MCH: 29.8 pg (ref 26.0–34.0)
MCHC: 32.7 g/dL (ref 30.0–36.0)
MCV: 91 fL (ref 80.0–100.0)
Monocytes Absolute: 0.1 10*3/uL (ref 0.1–1.0)
Monocytes Relative: 4 %
Neutro Abs: 1.2 10*3/uL — ABNORMAL LOW (ref 1.7–7.7)
Neutrophils Relative %: 56 %
Platelets: 325 10*3/uL (ref 150–400)
RBC: 4.1 MIL/uL — ABNORMAL LOW (ref 4.22–5.81)
RDW: 20.3 % — ABNORMAL HIGH (ref 11.5–15.5)
WBC: 2.1 10*3/uL — ABNORMAL LOW (ref 4.0–10.5)
nRBC: 0 % (ref 0.0–0.2)

## 2021-03-08 NOTE — Progress Notes (Signed)
pts feet have been swollen for about a week as well as sore behind his calf. States he went to [place] for vacation and noticed it getting somewhat worse then.

## 2021-03-08 NOTE — Progress Notes (Signed)
Hematology/Oncology Consult note [facility]  Telephone:([phone] Fax:([phone]  Patient Care Team: Pcp, No as PCP - General [name], MD as Consulting Physician (Oncology)   Name of the patient: Carl [name]  [id number]  03-16-1976   Date of visit: 03/08/21  Diagnosis- history of AML in remission  Chief complaint/ Reason for visit-routine follow-up of AML on oral Vidaza  Heme/Onc history: Carl [name]. 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 Gene Coding Predicted Protein Variant allele fraction  U2AF1 c.101C>T p.(Ser34Phe) 12.9 %  RUNX1 c.620_621insATCCCCCG p.(Gln208Serfs*6) 6.7 %  NRAS c.38G>A p.(Gly13Asp) 9.2 %   Variants of Unknown Clinical Significance:  Gene Coding Predicted Protein Variant allele fraction  TET2 c.4493G>A p.(Arg1498His) 48.2 %  ETV6 c.776G>A p.(Arg259Gln) 49.1 %   Pertinent Phenotypic data: blasts express CD7, CD13, CD34, CD38, CD71, CD117, and HLA-DR.   He received cytarabine and daunorubicin (7+3) beginning 04/13/2020 and 05/27/2020.  Course has been complicated by fever and neutropenia with mucositis (04/27/2020) and gluteal cleft cellulitis (05/06/2020).Received induction with 7+3+HD with delayed count recovery. D42 BMBx revealed persistent disease and patient re-induced with 7+3.  He then went on to receive consolidation therapy with HiDAC and cycle 4 received on 11/04/2020.  He follows up with Dr. Royce Macadamia at [facility]. Last bone marrow biopsy on 12/05/2020 which showed hypercellular bone marrow with trilineage hematopoiesis and 3% blasts by manual differential.   He is presently on azacitidine p.o. 300 mg daily 2 weeks on 2 weeks off.  He is also on Levaquin acyclovir prophylaxis.    Interval history-patient reports tolerating Vidaza well.  He was recently at [facility] for vacation and after he came back he started noticing swelling in his bilateral legs.  No new medications.  Denies  any shortness of breath palpitations or chest pain  ECOG PS- 1 Pain scale- 0   Review of systems- Review of Systems  Constitutional:  Negative for chills, fever, malaise/fatigue and weight loss.  HENT:  Negative for congestion, ear discharge and nosebleeds.   Eyes:  Negative for blurred vision.  Respiratory:  Negative for cough, hemoptysis, sputum production, shortness of breath and wheezing.   Cardiovascular:  Positive for leg swelling. Negative for chest pain, palpitations, orthopnea and claudication.  Gastrointestinal:  Negative for abdominal pain, blood in stool, constipation, diarrhea, heartburn, melena, nausea and vomiting.  Genitourinary:  Negative for dysuria, flank pain, frequency, hematuria and urgency.  Musculoskeletal:  Negative for back pain, joint pain and myalgias.  Skin:  Negative for rash.  Neurological:  Negative for dizziness, tingling, focal weakness, seizures, weakness and headaches.  Endo/Heme/Allergies:  Does not bruise/bleed easily.  Psychiatric/Behavioral:  Negative for depression and suicidal ideas. The patient does not have insomnia.      Allergies  Allergen Reactions   Dapsone     Contraindication - G6PD deficient   Primaquine     Contraindication - G6PD deficient   Rasburicase     Contraindication - G6PD deficient     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[place])    Agranulocytosis secondary to cancer chemotherapy (CODE) ([place])    Chemotherapy induced neutropenia (HCC)    Pancreatitis    Pancytopenia (HCC)    Thrombocytopenia (HCC)    Tobacco use disorder      History reviewed. No pertinent surgical history.  Social History   Socioeconomic History   Marital status: Single    Spouse name: Not on  file   Number of children: Not on file   Years of education: Not on file   Highest education level: Not on file  Occupational History   Not on file  Tobacco Use   Smoking status: Former    Packs/day: 1.00     Years: 17.00    Pack years: 17.00    Types: Cigarettes    Quit date: 03/27/2020    Years since quitting: 0.9   Smokeless tobacco: Never  Vaping Use   Vaping Use: Never used  Substance and Sexual Activity   Alcohol use: Not Currently    Alcohol/week: 6.0 standard drinks    Types: 6 Cans of beer per week    Comment: 6-24oz beers on weekly bases   Drug use: Never    Types: Marijuana   Sexual activity: Not on file  Other Topics Concern   Not on file  Social History Narrative   Not on file   Social Determinants of Health   Financial Resource Strain: Not on file  Food Insecurity: Not on file  Transportation Needs: Not on file  Physical Activity: Not on file  Stress: Not on file  Social Connections: Not on file  Intimate Partner Violence: Not on file    Family History  Problem Relation Age of Onset   Prostate cancer Neg Hx    Kidney cancer Neg Hx    Bladder Cancer Neg Hx      Current Outpatient Medications:    cyanocobalamin 1000 MCG tablet, Take 1,000 mcg by mouth daily., Disp: , Rfl:    Multiple Vitamin (MULTIVITAMIN WITH MINERALS) TABS tablet, Take 1 tablet by mouth daily., Disp: , Rfl:    valACYclovir (VALTREX) 500 MG tablet, Take 500 mg by mouth daily., Disp: , Rfl:    azaCITIDine 300 MG TABS, Take by mouth. (Patient not taking: No sig reported), Disp: , Rfl:    cyclobenzaprine (FLEXERIL) 10 MG tablet, Take 1 tablet (10 mg total) by mouth 3 (three) times daily as needed for muscle spasms. (Patient not taking: No sig reported), Disp: 30 tablet, Rfl: 0   levofloxacin (LEVAQUIN) 500 MG tablet, Take by mouth daily. (Patient not taking: No sig reported), Disp: , Rfl:    magnesium oxide (MAG-OX) 400 MG tablet, Take 800 mg by mouth daily. (Patient not taking: No sig reported), Disp: , Rfl:    ondansetron (ZOFRAN) 8 MG tablet, Take 1 tablet 30 minutes before each dose of Onureg. Can take additional doses every 8 hours as needed if having nausea. (Patient not taking: Reported  on 03/08/2021), Disp: , Rfl:    prochlorperazine (COMPAZINE) 10 MG tablet, Take by mouth. (Patient not taking: No sig reported), Disp: , Rfl:   Physical exam:  Vitals:   03/08/21 0916  BP: 114/66  Pulse: 65  Resp: 20  Temp: 97.6 F (36.4 C)  SpO2: 100%  Weight: 210 lb 15.7 oz (95.7 kg)   Physical Exam Constitutional:      General: He is not in acute distress. Cardiovascular:     Rate and Rhythm: Normal rate and regular rhythm.     Heart sounds: Normal heart sounds.  Pulmonary:     Effort: Pulmonary effort is normal.     Breath sounds: Normal breath sounds.  Abdominal:     General: Bowel sounds are normal.     Palpations: Abdomen is soft.  Musculoskeletal:     Comments: Bilateral ankle edema and trace bilateral leg edema bilaterally symmetrical  Skin:    General: Skin  is warm and dry.  Neurological:     Mental Status: He is alert and oriented to person, place, and time.     CMP Latest Ref Rng & Units 03/01/2021  Glucose 70 - 99 mg/dL 109(H)  BUN 6 - 20 mg/dL 12  Creatinine 0.61 - 1.24 mg/dL 1.06  Sodium 135 - 145 mmol/L 135  Potassium 3.5 - 5.1 mmol/L 3.8  Chloride 98 - 111 mmol/L 102  CO2 22 - 32 mmol/L 25  Calcium 8.9 - 10.3 mg/dL 9.0  Total Protein 6.5 - 8.1 g/dL 7.3  Total Bilirubin 0.3 - 1.2 mg/dL 1.0  Alkaline Phos 38 - 126 U/L 93  AST 15 - 41 U/L 51(H)  ALT 0 - 44 U/L 110(H)   CBC Latest Ref Rng & Units 03/08/2021  WBC 4.0 - 10.5 K/uL 2.1(L)  Hemoglobin 13.0 - 17.0 g/dL 12.2(L)  Hematocrit 39.0 - 52.0 % 37.3(L)  Platelets 150 - 400 K/uL 325    Assessment and plan- Patient is a 45 y.o. male with history of AML s/p induction chemo and 4 cycles of Hi DAC currently on maintenance azacitidine 2 weeks on 2 weeks off.  He is here for routine follow-up  Bilateral leg edema: Suspect dependent edema given that it is bilateral and symmetrical and appears trace mainly around the ankles.  No new medications.  Suspicion of DVT is low but I will proceed with a bilateral  lower extremity ultrasound.  Recommend conservative measures including leg elevation.  No need for diuretics  With regards to his AML patient will continue oral Vidaza 2 weeks on and 2 weeks off.  He completesPresents cycle on 03/14/2021 and will begin his next cycle on 03/28/2021.  He does have some baseline neutropenia but hemoglobin and platelets are presently normal given the stability of his counts I will plan to check his counts monthly basis prior to starting his next cycle of Vidaza.  He will come for CBC checked on 03/24/2021 and I will see him in 4 weeks from now   Visit Diagnosis 1. Acute myeloid leukemia in remission ([place])   2. Bilateral edema of lower extremity   3. High risk medication use      Dr. Randa Evens, MD, MPH [facility] [phone] 03/08/2021 12:56 PM

## 2021-03-09 ENCOUNTER — Ambulatory Visit
Admission: RE | Admit: 2021-03-09 | Discharge: 2021-03-09 | Disposition: A | Discharge status: Home / Self Care | Payer: No Typology Code available for payment source | Source: Ambulatory Visit | Attending: Oncology | Admitting: Oncology

## 2021-03-09 DIAGNOSIS — C9201 Acute myeloblastic leukemia, in remission: Secondary | ICD-10-CM | POA: Insufficient documentation

## 2021-03-09 DIAGNOSIS — R600 Localized edema: Secondary | ICD-10-CM

## 2021-03-24 ENCOUNTER — Inpatient Hospital Stay: Payer: No Typology Code available for payment source

## 2021-03-24 ENCOUNTER — Other Ambulatory Visit: Payer: Self-pay

## 2021-03-24 DIAGNOSIS — C9201 Acute myeloblastic leukemia, in remission: Secondary | ICD-10-CM

## 2021-03-24 LAB — CBC WITH DIFFERENTIAL/PLATELET
Abs Immature Granulocytes: 0.01 10*3/uL (ref 0.00–0.07)
Basophils Absolute: 0 10*3/uL (ref 0.0–0.1)
Basophils Relative: 2 %
Eosinophils Absolute: 0 10*3/uL (ref 0.0–0.5)
Eosinophils Relative: 2 %
HCT: 39.1 % (ref 39.0–52.0)
Hemoglobin: 12.9 g/dL — ABNORMAL LOW (ref 13.0–17.0)
Immature Granulocytes: 1 %
Lymphocytes Relative: 37 %
Lymphs Abs: 0.8 10*3/uL (ref 0.7–4.0)
MCH: 29.9 pg (ref 26.0–34.0)
MCHC: 33 g/dL (ref 30.0–36.0)
MCV: 90.7 fL (ref 80.0–100.0)
Monocytes Absolute: 0.1 10*3/uL (ref 0.1–1.0)
Monocytes Relative: 5 %
Neutro Abs: 1.1 10*3/uL — ABNORMAL LOW (ref 1.7–7.7)
Neutrophils Relative %: 53 %
Platelets: 152 10*3/uL (ref 150–400)
RBC: 4.31 MIL/uL (ref 4.22–5.81)
RDW: 19.2 % — ABNORMAL HIGH (ref 11.5–15.5)
WBC: 2.1 10*3/uL — ABNORMAL LOW (ref 4.0–10.5)
nRBC: 0 % (ref 0.0–0.2)

## 2021-03-24 LAB — COMPREHENSIVE METABOLIC PANEL
ALT: 106 U/L — ABNORMAL HIGH (ref 0–44)
AST: 55 U/L — ABNORMAL HIGH (ref 15–41)
Albumin: 4.1 g/dL (ref 3.5–5.0)
Alkaline Phosphatase: 115 U/L (ref 38–126)
Anion gap: 6 (ref 5–15)
BUN: 13 mg/dL (ref 6–20)
CO2: 26 mmol/L (ref 22–32)
Calcium: 9.1 mg/dL (ref 8.9–10.3)
Chloride: 104 mmol/L (ref 98–111)
Creatinine, Ser: 1.14 mg/dL (ref 0.61–1.24)
GFR, Estimated: >60 mL/min (ref >60)
Glucose, Bld: 124 mg/dL — ABNORMAL HIGH (ref 70–99)
Potassium: 4.2 mmol/L (ref 3.5–5.1)
Sodium: 136 mmol/L (ref 135–145)
Total Bilirubin: 0.7 mg/dL (ref 0.3–1.2)
Total Protein: 7.2 g/dL (ref 6.5–8.1)

## 2021-04-03 ENCOUNTER — Encounter: Payer: Self-pay | Admitting: Hematology and Oncology

## 2021-04-05 ENCOUNTER — Encounter: Payer: Self-pay | Admitting: Oncology

## 2021-04-05 ENCOUNTER — Inpatient Hospital Stay (HOSPITAL_BASED_OUTPATIENT_CLINIC_OR_DEPARTMENT_OTHER): Payer: No Typology Code available for payment source | Admitting: Oncology

## 2021-04-05 ENCOUNTER — Inpatient Hospital Stay: Payer: No Typology Code available for payment source

## 2021-04-05 ENCOUNTER — Other Ambulatory Visit: Payer: Self-pay

## 2021-04-05 ENCOUNTER — Inpatient Hospital Stay: Payer: No Typology Code available for payment source | Attending: Oncology

## 2021-04-05 VITALS — BP 127/86 | HR 83 | Temp 96.9°F | Resp 18 | Wt 211.4 lb

## 2021-04-05 DIAGNOSIS — Z79899 Other long term (current) drug therapy: Secondary | ICD-10-CM | POA: Diagnosis not present

## 2021-04-05 DIAGNOSIS — T451X5A Adverse effect of antineoplastic and immunosuppressive drugs, initial encounter: Secondary | ICD-10-CM | POA: Diagnosis not present

## 2021-04-05 DIAGNOSIS — C9201 Acute myeloblastic leukemia, in remission: Secondary | ICD-10-CM

## 2021-04-05 DIAGNOSIS — D701 Agranulocytosis secondary to cancer chemotherapy: Secondary | ICD-10-CM | POA: Diagnosis not present

## 2021-04-05 DIAGNOSIS — C9211 Chronic myeloid leukemia, BCR/ABL-positive, in remission: Secondary | ICD-10-CM | POA: Diagnosis not present

## 2021-04-05 LAB — CBC WITH DIFFERENTIAL/PLATELET
Abs Immature Granulocytes: 0.01 10*3/uL (ref 0.00–0.07)
Basophils Absolute: 0 10*3/uL (ref 0.0–0.1)
Basophils Relative: 2 %
Eosinophils Absolute: 0 10*3/uL (ref 0.0–0.5)
Eosinophils Relative: 2 %
HCT: 41.8 % (ref 39.0–52.0)
Hemoglobin: 13.9 g/dL (ref 13.0–17.0)
Immature Granulocytes: 1 %
Lymphocytes Relative: 41 %
Lymphs Abs: 0.7 10*3/uL (ref 0.7–4.0)
MCH: 29.6 pg (ref 26.0–34.0)
MCHC: 33.3 g/dL (ref 30.0–36.0)
MCV: 89.1 fL (ref 80.0–100.0)
Monocytes Absolute: 0.1 10*3/uL (ref 0.1–1.0)
Monocytes Relative: 5 %
Neutro Abs: 0.8 10*3/uL — ABNORMAL LOW (ref 1.7–7.7)
Neutrophils Relative %: 49 %
Platelets: 290 10*3/uL (ref 150–400)
RBC: 4.69 MIL/uL (ref 4.22–5.81)
RDW: 18.1 % — ABNORMAL HIGH (ref 11.5–15.5)
WBC: 1.7 10*3/uL — ABNORMAL LOW (ref 4.0–10.5)
nRBC: 1.2 % — ABNORMAL HIGH (ref 0.0–0.2)

## 2021-04-05 NOTE — Progress Notes (Signed)
Hematology/Oncology Consult note [facility]  [phone] [phone]  Patient Care Team: Pcp, No as PCP - General [name], MD as Consulting Physician (Oncology)   Name of the patient: Carl [name]  [id number]  [birth year]   Date of visit: 04/05/21  Diagnosis- history of AML in remission  Chief complaint/ Reason for visit-routine follow-up of AML on oral Vidaza  Heme/Onc history:  Carl Jaysiah Marchetta. 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 Gene Coding Predicted Protein Variant allele fraction  U2AF1 c.101C>T p.(Ser34Phe) 12.9 %  RUNX1 c.620_621insATCCCCCG p.(Gln208Serfs*6) 6.7 %  NRAS c.38G>A p.(Gly13Asp) 9.2 %   Variants of Unknown Clinical Significance:  Gene Coding Predicted Protein Variant allele fraction  TET2 c.4493G>A p.(Arg1498His) 48.2 %  ETV6 c.776G>A p.(Arg259Gln) 49.1 %   Pertinent Phenotypic data: blasts express CD7, CD13, CD34, CD38, CD71, CD117, and HLA-DR.   He received cytarabine and daunorubicin (7+3) beginning 04/13/2020 and 05/27/2020.  Course has been complicated by fever and neutropenia with mucositis (04/27/2020) and gluteal cleft cellulitis (05/06/2020).Received induction with 7+3+HD with delayed count recovery. D42 BMBx revealed persistent disease and patient re-induced with 7+3.  He then went on to receive consolidation therapy with HiDAC and cycle 4 received on 11/04/2020.  He follows up with Dr. Royce Macadamia at [facility]. Last bone marrow biopsy on 12/05/2020 which showed hypercellular bone marrow with trilineage hematopoiesis and 3% blasts by manual differential.   He is presently on azacitidine p.o. 300 mg daily 2 weeks on 2 weeks off.  He is also on Levaquin acyclovir prophylaxis.   Interval history-reports that certain foods make him nauseous but symptoms are self-limited.  Leg swelling is sometimes up and down.  Reports having occasional bilateral testicular pain.  States  that he had that kind of a pain many years ago and was given medications for that.  Denies any palpitations  ECOG PS- 1 Pain scale- 0   Review of systems- Review of Systems  Constitutional:  Negative for chills, fever, malaise/fatigue and weight loss.  HENT:  Negative for congestion, ear discharge and nosebleeds.   Eyes:  Negative for blurred vision.  Respiratory:  Negative for cough, hemoptysis, sputum production, shortness of breath and wheezing.   Cardiovascular:  Negative for chest pain, palpitations, orthopnea and claudication.  Gastrointestinal:  Positive for nausea. Negative for abdominal pain, blood in stool, constipation, diarrhea, heartburn, melena and vomiting.  Genitourinary:  Negative for dysuria, flank pain, frequency, hematuria and urgency.  Musculoskeletal:  Negative for back pain, joint pain and myalgias.  Skin:  Negative for rash.  Neurological:  Negative for dizziness, tingling, focal weakness, seizures, weakness and headaches.  Endo/Heme/Allergies:  Does not bruise/bleed easily.  Psychiatric/Behavioral:  Negative for depression and suicidal ideas. The patient does not have insomnia.       Allergies  Allergen Reactions   Dapsone     Contraindication - G6PD deficient   Primaquine     Contraindication - G6PD deficient   Rasburicase     Contraindication - G6PD deficient     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[place])    Agranulocytosis secondary to cancer chemotherapy (CODE) ([place])    Chemotherapy induced neutropenia (HCC)    Pancreatitis    Pancytopenia (HCC)    Thrombocytopenia (HCC)    Tobacco use disorder      History reviewed. No pertinent surgical history.  Social History   Socioeconomic History   Marital status:  Single    Spouse name: Not on file   Number of children: Not on file   Years of education: Not on file   Highest education level: Not on file  Occupational History   Not on file  Tobacco Use    Smoking status: Former    Packs/day: 1.00    Years: 17.00    Pack years: 17.00    Types: Cigarettes    Quit date: 03/27/2020    Years since quitting: 1.0   Smokeless tobacco: Never  Vaping Use   Vaping Use: Never used  Substance and Sexual Activity   Alcohol use: Not Currently    Alcohol/week: 6.0 standard drinks    Types: 6 Cans of beer per week    Comment: 6-24oz beers on weekly bases   Drug use: Never    Types: Marijuana   Sexual activity: Not on file  Other Topics Concern   Not on file  Social History Narrative   Not on file   Social Determinants of Health   Financial Resource Strain: Not on file  Food Insecurity: Not on file  Transportation Needs: Not on file  Physical Activity: Not on file  Stress: Not on file  Social Connections: Not on file  Intimate Partner Violence: Not on file    Family History  Problem Relation Age of Onset   Prostate cancer Neg Hx    Kidney cancer Neg Hx    Bladder Cancer Neg Hx      Current Outpatient Medications:    cyanocobalamin 1000 MCG tablet, Take 1,000 mcg by mouth daily., Disp: , Rfl:    Multiple Vitamin (MULTIVITAMIN WITH MINERALS) TABS tablet, Take 1 tablet by mouth daily., Disp: , Rfl:    valACYclovir (VALTREX) 500 MG tablet, Take 500 mg by mouth daily., Disp: , Rfl:    azaCITIDine 300 MG TABS, Take by mouth. (Patient not taking: No sig reported), Disp: , Rfl:    cyclobenzaprine (FLEXERIL) 10 MG tablet, Take 1 tablet (10 mg total) by mouth 3 (three) times daily as needed for muscle spasms. (Patient not taking: No sig reported), Disp: 30 tablet, Rfl: 0   levofloxacin (LEVAQUIN) 500 MG tablet, Take by mouth daily. (Patient not taking: No sig reported), Disp: , Rfl:    magnesium oxide (MAG-OX) 400 MG tablet, Take 800 mg by mouth daily. (Patient not taking: No sig reported), Disp: , Rfl:    ondansetron (ZOFRAN) 8 MG tablet, Take 1 tablet 30 minutes before each dose of Onureg. Can take additional doses every 8 hours as needed if  having nausea. (Patient not taking: No sig reported), Disp: , Rfl:    prochlorperazine (COMPAZINE) 10 MG tablet, Take by mouth. (Patient not taking: No sig reported), Disp: , Rfl:   Physical exam:  Vitals:   04/05/21 0926  BP: 127/86  Pulse: 83  Resp: 18  Temp: (!) 96.9 F (36.1 C)  SpO2: 100%  Weight: 211 lb 6.7 oz (95.9 kg)   Physical Exam Constitutional:      General: He is not in acute distress. Cardiovascular:     Rate and Rhythm: Normal rate and regular rhythm.     Heart sounds: Normal heart sounds.  Pulmonary:     Effort: Pulmonary effort is normal.     Breath sounds: Normal breath sounds.  Abdominal:     General: Bowel sounds are normal. There is no distension.     Palpations: Abdomen is soft.     Tenderness: There is no abdominal tenderness.  Musculoskeletal:     Comments: Trace bilateral edema  Skin:    General: Skin is warm and dry.  Neurological:     Mental Status: He is alert and oriented to person, place, and time.     CMP Latest Ref Rng & Units 03/24/2021  Glucose 70 - 99 mg/dL 124(H)  BUN 6 - 20 mg/dL 13  Creatinine 0.61 - 1.24 mg/dL 1.14  Sodium 135 - 145 mmol/L 136  Potassium 3.5 - 5.1 mmol/L 4.2  Chloride 98 - 111 mmol/L 104  CO2 22 - 32 mmol/L 26  Calcium 8.9 - 10.3 mg/dL 9.1  Total Protein 6.5 - 8.1 g/dL 7.2  Total Bilirubin 0.3 - 1.2 mg/dL 0.7  Alkaline Phos 38 - 126 U/L 115  AST 15 - 41 U/L 55(H)  ALT 0 - 44 U/L 106(H)   CBC Latest Ref Rng & Units 04/05/2021  WBC 4.0 - 10.5 K/uL 1.7(L)  Hemoglobin 13.0 - 17.0 g/dL 13.9  Hematocrit 39.0 - 52.0 % 41.8  Platelets 150 - 400 K/uL 290    No images are attached to the encounter.  US Venous Img Lower Bilateral  Result Date: 03/09/2021 CLINICAL DATA:  Bilateral ankle edema for 1 month EXAM: BILATERAL LOWER EXTREMITY VENOUS DOPPLER ULTRASOUND TECHNIQUE: Gray-scale sonography with compression, as well as color and duplex ultrasound, were performed to evaluate the deep venous system(s) from the  level of the common femoral vein through the popliteal and proximal calf veins. COMPARISON:  None. FINDINGS: VENOUS Normal compressibility of the common femoral, superficial femoral, and popliteal veins, as well as the visualized calf veins. Visualized portions of profunda femoral vein and great saphenous vein unremarkable. No filling defects to suggest DVT on grayscale or color Doppler imaging. Doppler waveforms show normal direction of venous flow, normal respiratory plasticity and response to augmentation. OTHER None. Limitations: Peroneal veins were not visualized. IMPRESSION: No lower extremity DVT. Electronically Signed   By: Miachel Roux M.D.   On: 03/09/2021 14:05     Assessment and plan- Patient is a 45 y.o. male with history of AML s/p induction chemo and 4 cycles of Hi DAC currently on maintenance azacitidine 2 weeks on 2 weeks off.   Patient started cycle 5 of oral Vidaza on 03/28/2021 and counts prior to that showed normal platelet count and an ANC that was more than 1.  He does have some chemo induced neutropenia possibly secondary to [place] which we will continue to monitor.Asked as long as platelets are more than 100 hemoglobin more than 10 and ANC more than 1 he will continue to take oral Vidaza 2 weeks on and 2 weeks off.  [name] port has not been accessed for over 6 months now.  We will plan for port labs and September 2022 when plan to get port flushes/labs every 8 weeks.  Check CBC with differential, CMP on 04/24/2021.  CBC with differential, CMP and see Dr. Janese Banks on 05/17/2021   Visit Diagnosis 1. High risk medication use   2. Chemotherapy induced neutropenia (HCC)   3. Acute myeloid leukemia in remission [facility])      Dr. Randa Evens, MD, MPH [facility] at [facility] [phone] 04/05/2021 1:02 PM

## 2021-04-05 NOTE — Progress Notes (Signed)
Pt c/o abdomen pain when laying down, n has been happening more frequently. As well as, certain smells are making him nauseous, has recently noticed that his left foot is somewhat swollen, not painful.

## 2021-04-20 ENCOUNTER — Other Ambulatory Visit: Payer: Self-pay | Admitting: *Deleted

## 2021-04-20 DIAGNOSIS — C9201 Acute myeloblastic leukemia, in remission: Secondary | ICD-10-CM

## 2021-04-24 ENCOUNTER — Telehealth: Payer: Self-pay | Admitting: *Deleted

## 2021-04-24 ENCOUNTER — Inpatient Hospital Stay: Payer: No Typology Code available for payment source

## 2021-04-24 DIAGNOSIS — C9211 Chronic myeloid leukemia, BCR/ABL-positive, in remission: Secondary | ICD-10-CM | POA: Diagnosis not present

## 2021-04-24 DIAGNOSIS — C9201 Acute myeloblastic leukemia, in remission: Secondary | ICD-10-CM

## 2021-04-24 LAB — COMPREHENSIVE METABOLIC PANEL
ALT: 117 U/L — ABNORMAL HIGH (ref 0–44)
AST: 58 U/L — ABNORMAL HIGH (ref 15–41)
Albumin: 4 g/dL (ref 3.5–5.0)
Alkaline Phosphatase: 117 U/L (ref 38–126)
Anion gap: 9 (ref 5–15)
BUN: 9 mg/dL (ref 6–20)
CO2: 26 mmol/L (ref 22–32)
Calcium: 8.8 mg/dL — ABNORMAL LOW (ref 8.9–10.3)
Chloride: 100 mmol/L (ref 98–111)
Creatinine, Ser: 1.25 mg/dL — ABNORMAL HIGH (ref 0.61–1.24)
GFR, Estimated: >60 mL/min (ref >60)
Glucose, Bld: 168 mg/dL — ABNORMAL HIGH (ref 70–99)
Potassium: 3.9 mmol/L (ref 3.5–5.1)
Sodium: 135 mmol/L (ref 135–145)
Total Bilirubin: 1 mg/dL (ref 0.3–1.2)
Total Protein: 7 g/dL (ref 6.5–8.1)

## 2021-04-24 LAB — CBC WITH DIFFERENTIAL/PLATELET
Abs Immature Granulocytes: 0 10*3/uL (ref 0.00–0.07)
Basophils Absolute: 0.1 10*3/uL (ref 0.0–0.1)
Basophils Relative: 4 %
Eosinophils Absolute: 0 10*3/uL (ref 0.0–0.5)
Eosinophils Relative: 3 %
HCT: 40.1 % (ref 39.0–52.0)
Hemoglobin: 13.5 g/dL (ref 13.0–17.0)
Immature Granulocytes: 0 %
Lymphocytes Relative: 37 %
Lymphs Abs: 0.6 10*3/uL — ABNORMAL LOW (ref 0.7–4.0)
MCH: 30.6 pg (ref 26.0–34.0)
MCHC: 33.7 g/dL (ref 30.0–36.0)
MCV: 90.9 fL (ref 80.0–100.0)
Monocytes Absolute: 0.1 10*3/uL (ref 0.1–1.0)
Monocytes Relative: 5 %
Neutro Abs: 0.8 10*3/uL — ABNORMAL LOW (ref 1.7–7.7)
Neutrophils Relative %: 51 %
Platelets: 158 10*3/uL (ref 150–400)
RBC: 4.41 MIL/uL (ref 4.22–5.81)
RDW: 17 % — ABNORMAL HIGH (ref 11.5–15.5)
Smear Review: NORMAL
WBC: 1.6 10*3/uL — ABNORMAL LOW (ref 4.0–10.5)
nRBC: 0 % (ref 0.0–0.2)

## 2021-04-24 NOTE — Telephone Encounter (Signed)
Called pt and left a message to pt to let me know that has he started the [place]. Also, if pt is taking the drug then stop. His wbc is low and we need to check with [employer] to see if she should take the cycle or not of vidaza. Pt did not rtn my call and I called him again. The pt said his last pill was 8/17. He has a video or telephone on this wed.  I told him to hold off until we know waht [facility] wants to do. The pt also wants rx for nausea pill or else he can't afford it. He is not sure if he gets compazine or zofran. I will let him knoe tom. About what to do. Pt is agreeable

## 2021-04-25 ENCOUNTER — Telehealth: Payer: Self-pay | Admitting: *Deleted

## 2021-04-25 NOTE — Telephone Encounter (Signed)
At 3 PM I given a call to Carl [name] who is a Marine scientist that works with Carl [name] at [facility].  Got Carl [name]'s voicemail and let him know that Carl [name] wanted to see if [name] should start on his next cycle based on his white blood count being 1.6.  Neutrophils 0.8.  [name] is also wanting some nausea pills sent in or else his insurance does not pay for.  I did talk with Carl [name] Carl day before and he said his last pill he took was on August 17.  Told Carl [name] that I would be calling Carl [name] to see if I can get some information and then Carl [name] told me also that he will be doing a telephone or video visit with Carl [name] and Carl [name] on Wednesday, August 31.  My message to Carl [name] I did ask him to call me back on my cell phone and was given to direct number.

## 2021-04-26 ENCOUNTER — Telehealth: Payer: Self-pay

## 2021-04-26 NOTE — Telephone Encounter (Signed)
Spoke to [employer], Therapist, sports at [employer]. Called to give an update on pt. [facility] will like to know if we can repeat lab work on 9/7 (CBC, Arthur). If ANC has recovered to a value of 1.0 or greater, they will like for Dr. Janese Banks to give permission in order to begin tx once again. If permission is granted, they will like a call to update them on tx status. Will also like to know if he can be scheduled for a port flush on 9/7 as well. If not, we need to let them know so they can schedule pt for a flush.

## 2021-05-16 ENCOUNTER — Other Ambulatory Visit: Payer: Self-pay | Admitting: *Deleted

## 2021-05-16 DIAGNOSIS — C9201 Acute myeloblastic leukemia, in remission: Secondary | ICD-10-CM

## 2021-05-17 ENCOUNTER — Inpatient Hospital Stay (HOSPITAL_BASED_OUTPATIENT_CLINIC_OR_DEPARTMENT_OTHER): Payer: No Typology Code available for payment source | Admitting: Oncology

## 2021-05-17 ENCOUNTER — Telehealth: Payer: Self-pay | Admitting: *Deleted

## 2021-05-17 ENCOUNTER — Inpatient Hospital Stay: Payer: No Typology Code available for payment source | Attending: Oncology

## 2021-05-17 ENCOUNTER — Encounter: Payer: Self-pay | Admitting: Oncology

## 2021-05-17 ENCOUNTER — Other Ambulatory Visit: Payer: Self-pay

## 2021-05-17 VITALS — BP 116/73 | HR 78 | Temp 97.5°F | Resp 20 | Wt 217.6 lb

## 2021-05-17 DIAGNOSIS — R945 Abnormal results of liver function studies: Secondary | ICD-10-CM

## 2021-05-17 DIAGNOSIS — Z79899 Other long term (current) drug therapy: Secondary | ICD-10-CM | POA: Diagnosis not present

## 2021-05-17 DIAGNOSIS — C9201 Acute myeloblastic leukemia, in remission: Secondary | ICD-10-CM

## 2021-05-17 DIAGNOSIS — R7989 Other specified abnormal findings of blood chemistry: Secondary | ICD-10-CM | POA: Diagnosis not present

## 2021-05-17 DIAGNOSIS — Z87891 Personal history of nicotine dependence: Secondary | ICD-10-CM | POA: Diagnosis not present

## 2021-05-17 LAB — CBC WITH DIFFERENTIAL/PLATELET
Abs Immature Granulocytes: 0.01 10*3/uL (ref 0.00–0.07)
Basophils Absolute: 0 10*3/uL (ref 0.0–0.1)
Basophils Relative: 0 %
Eosinophils Absolute: 0 10*3/uL (ref 0.0–0.5)
Eosinophils Relative: 2 %
HCT: 42.1 % (ref 39.0–52.0)
Hemoglobin: 14.3 g/dL (ref 13.0–17.0)
Immature Granulocytes: 0 %
Lymphocytes Relative: 29 %
Lymphs Abs: 0.7 10*3/uL (ref 0.7–4.0)
MCH: 30.8 pg (ref 26.0–34.0)
MCHC: 34 g/dL (ref 30.0–36.0)
MCV: 90.7 fL (ref 80.0–100.0)
Monocytes Absolute: 0.3 10*3/uL (ref 0.1–1.0)
Monocytes Relative: 12 %
Neutro Abs: 1.4 10*3/uL — ABNORMAL LOW (ref 1.7–7.7)
Neutrophils Relative %: 57 %
Platelets: 155 10*3/uL (ref 150–400)
RBC: 4.64 MIL/uL (ref 4.22–5.81)
RDW: 15.1 % (ref 11.5–15.5)
WBC: 2.4 10*3/uL — ABNORMAL LOW (ref 4.0–10.5)
nRBC: 0 % (ref 0.0–0.2)

## 2021-05-17 LAB — COMPREHENSIVE METABOLIC PANEL
ALT: 75 U/L — ABNORMAL HIGH (ref 0–44)
AST: 45 U/L — ABNORMAL HIGH (ref 15–41)
Albumin: 4 g/dL (ref 3.5–5.0)
Alkaline Phosphatase: 106 U/L (ref 38–126)
Anion gap: 5 (ref 5–15)
BUN: 13 mg/dL (ref 6–20)
CO2: 28 mmol/L (ref 22–32)
Calcium: 9 mg/dL (ref 8.9–10.3)
Chloride: 101 mmol/L (ref 98–111)
Creatinine, Ser: 1.19 mg/dL (ref 0.61–1.24)
GFR, Estimated: >60 mL/min (ref >60)
Glucose, Bld: 166 mg/dL — ABNORMAL HIGH (ref 70–99)
Potassium: 3.7 mmol/L (ref 3.5–5.1)
Sodium: 134 mmol/L — ABNORMAL LOW (ref 135–145)
Total Bilirubin: 1 mg/dL (ref 0.3–1.2)
Total Protein: 7.5 g/dL (ref 6.5–8.1)

## 2021-05-17 MED ORDER — LIDOCAINE-PRILOCAINE 2.5-2.5 % EX CREA: 1.0000 "application " | TOPICAL_CREAM | CUTANEOUS | 0 refills | Status: DC | PRN

## 2021-05-17 NOTE — Telephone Encounter (Signed)
Called the pt and let him know that he needs to have port flush, it has been a while and he is ok with that. I asked him if he uses emla cream to numb the area over the port. He had used it before but not got any cream now. I will send in RX to his pharmacy [facility] and he is good and he has his schedule with the appts. I told him to put cream over the port site 1 to 1 1/2 hours before his appt that day. He states he will do this

## 2021-05-17 NOTE — Progress Notes (Signed)
Hematology/Oncology Consult note [facility]  [phone] [phone]  Patient Care Team: Pcp, No as PCP - General [name], MD as Consulting Physician (Oncology)   Name of the patient: Carl [name]  [id number]  07/10/1976   Date of visit: 05/17/21  Diagnosis- history of AML in remission  Chief complaint/ Reason for visit-routine follow-up for AML on oral Vidaza  Heme/Onc history: Carl [name]. 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 Gene Coding Predicted Protein Variant allele fraction  U2AF1 c.101C>T p.(Ser34Phe) 12.9 %  RUNX1 c.620_621insATCCCCCG p.(Gln208Serfs*6) 6.7 %  NRAS c.38G>A p.(Gly13Asp) 9.2 %   Variants of Unknown Clinical Significance:  Gene Coding Predicted Protein Variant allele fraction  TET2 c.4493G>A p.(Arg1498His) 48.2 %  ETV6 c.776G>A p.(Arg259Gln) 49.1 %   Pertinent Phenotypic data: blasts express CD7, CD13, CD34, CD38, CD71, CD117, and HLA-DR.   He received cytarabine and daunorubicin (7+3) beginning 04/13/2020 and 05/27/2020.  Course has been complicated by fever and neutropenia with mucositis (04/27/2020) and gluteal cleft cellulitis (05/06/2020).Received induction with 7+3+HD with delayed count recovery. D42 BMBx revealed persistent disease and patient re-induced with 7+3.  He then went on to receive consolidation therapy with HiDAC and cycle 4 received on 11/04/2020.  He follows up with Dr. Royce Macadamia at [facility]. Last bone marrow biopsy on 12/05/2020 which showed hypercellular bone marrow with trilineage hematopoiesis and 3% blasts by manual differential.   He is presently on azacitidine p.o. 300 mg daily 2 weeks on 2 weeks off.      Interval history-patient reports on and off headaches which started about 4 days ago.  He has taken as needed Tylenol but does not feel that it is helping him as much.  Denies any focal numbness or weakness.  Denies any nausea vomiting or  changes in his vision.  Also reports dry nonproductive cough.  Denies any fever or mucoid expectoration.  ECOG PS- 0 Pain scale- 0  Review of systems- Review of Systems  Constitutional:  Negative for chills, fever, malaise/fatigue and weight loss.  HENT:  Negative for congestion, ear discharge and nosebleeds.   Eyes:  Negative for blurred vision.  Respiratory:  Negative for cough, hemoptysis, sputum production, shortness of breath and wheezing.   Cardiovascular:  Negative for chest pain, palpitations, orthopnea and claudication.  Gastrointestinal:  Negative for abdominal pain, blood in stool, constipation, diarrhea, heartburn, melena, nausea and vomiting.  Genitourinary:  Negative for dysuria, flank pain, frequency, hematuria and urgency.  Musculoskeletal:  Negative for back pain, joint pain and myalgias.  Skin:  Negative for rash.  Neurological:  Positive for headaches. Negative for dizziness, tingling, focal weakness, seizures and weakness.  Endo/Heme/Allergies:  Does not bruise/bleed easily.  Psychiatric/Behavioral:  Negative for depression and suicidal ideas. The patient does not have insomnia.       Allergies  Allergen Reactions   Dapsone     Contraindication - G6PD deficient   Primaquine     Contraindication - G6PD deficient   Rasburicase     Contraindication - G6PD deficient     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[place])    Agranulocytosis secondary to cancer chemotherapy (CODE) ([place])    Chemotherapy induced neutropenia (HCC)    Pancreatitis    Pancytopenia (HCC)    Thrombocytopenia (HCC)    Tobacco use disorder      History reviewed. No pertinent surgical history.  Social History   Socioeconomic History  Marital status: Single    Spouse name: Not on file   Number of children: Not on file   Years of education: Not on file   Highest education level: Not on file  Occupational History   Not on file  Tobacco Use    Smoking status: Former    Packs/day: 1.00    Years: 17.00    Pack years: 17.00    Types: Cigarettes    Quit date: 03/27/2020    Years since quitting: 1.1   Smokeless tobacco: Never  Vaping Use   Vaping Use: Never used  Substance and Sexual Activity   Alcohol use: Not Currently    Alcohol/week: 6.0 standard drinks    Types: 6 Cans of beer per week    Comment: 6-24oz beers on weekly bases   Drug use: Never    Types: Marijuana   Sexual activity: Not on file  Other Topics Concern   Not on file  Social History Narrative   Not on file   Social Determinants of Health   Financial Resource Strain: Not on file  Food Insecurity: Not on file  Transportation Needs: Not on file  Physical Activity: Not on file  Stress: Not on file  Social Connections: Not on file  Intimate Partner Violence: Not on file    Family History  Problem Relation Age of Onset   Prostate cancer Neg Hx    Kidney cancer Neg Hx    Bladder Cancer Neg Hx      Current Outpatient Medications:    cyanocobalamin 1000 MCG tablet, Take 1,000 mcg by mouth daily., Disp: , Rfl:    Multiple Vitamin (MULTIVITAMIN WITH MINERALS) TABS tablet, Take 1 tablet by mouth daily., Disp: , Rfl:    valACYclovir (VALTREX) 500 MG tablet, Take 500 mg by mouth daily., Disp: , Rfl:    azaCITIDine 300 MG TABS, Take by mouth. (Patient not taking: Reported on 05/17/2021), Disp: , Rfl:    cyclobenzaprine (FLEXERIL) 10 MG tablet, Take 1 tablet (10 mg total) by mouth 3 (three) times daily as needed for muscle spasms. (Patient not taking: No sig reported), Disp: 30 tablet, Rfl: 0   levofloxacin (LEVAQUIN) 500 MG tablet, Take by mouth daily. (Patient not taking: No sig reported), Disp: , Rfl:    magnesium oxide (MAG-OX) 400 MG tablet, Take 800 mg by mouth daily. (Patient not taking: No sig reported), Disp: , Rfl:    ondansetron (ZOFRAN) 8 MG tablet, Take 1 tablet 30 minutes before each dose of Onureg. Can take additional doses every 8 hours as needed  if having nausea. (Patient not taking: No sig reported), Disp: , Rfl:    prochlorperazine (COMPAZINE) 10 MG tablet, Take by mouth. (Patient not taking: No sig reported), Disp: , Rfl:   Physical exam:  Vitals:   05/17/21 0932  BP: 116/73  Pulse: 78  Resp: 20  Temp: (!) 97.5 F (36.4 C)  SpO2: 100%  Weight: 217 lb 9.5 oz (98.7 kg)   Physical Exam Constitutional:      General: He is not in acute distress. HENT:     Mouth/Throat:     Mouth: Mucous membranes are moist.     Pharynx: Oropharynx is clear.  Cardiovascular:     Rate and Rhythm: Normal rate and regular rhythm.     Heart sounds: Normal heart sounds.  Pulmonary:     Effort: Pulmonary effort is normal.     Breath sounds: Normal breath sounds.  Abdominal:     General: Bowel  sounds are normal.     Palpations: Abdomen is soft.  Skin:    General: Skin is warm and dry.  Neurological:     General: No focal deficit present.     Mental Status: He is alert and oriented to person, place, and time.     CMP Latest Ref Rng & Units 05/17/2021  Glucose 70 - 99 mg/dL 166(H)  BUN 6 - 20 mg/dL 13  Creatinine 0.61 - 1.24 mg/dL 1.19  Sodium 135 - 145 mmol/L 134(L)  Potassium 3.5 - 5.1 mmol/L 3.7  Chloride 98 - 111 mmol/L 101  CO2 22 - 32 mmol/L 28  Calcium 8.9 - 10.3 mg/dL 9.0  Total Protein 6.5 - 8.1 g/dL 7.5  Total Bilirubin 0.3 - 1.2 mg/dL 1.0  Alkaline Phos 38 - 126 U/L 106  AST 15 - 41 U/L 45(H)  ALT 0 - 44 U/L 75(H)   CBC Latest Ref Rng & Units 05/17/2021  WBC 4.0 - 10.5 K/uL 2.4(L)  Hemoglobin 13.0 - 17.0 g/dL 14.3  Hematocrit 39.0 - 52.0 % 42.1  Platelets 150 - 400 K/uL 155    Assessment and plan- Patient is a 45 y.o. male  with history of AML s/p induction chemo and 4 cycles of Hi DAC currently on maintenance azacitidine 2 weeks on 2 weeks off.  This is a routine follow-up visit  [name] Vidaza was held due to ongoing neutropenia.  Today his white count is 2.4 with an [place] is greater than 1 and therefore he can  restart his Vidaza 2 weeks on and 2 weeks off.  Repeat CBC with differential in 4 weeks and 8 weeks and I will see him back in 8 weeks.  Once his labs are back in 4 weeks we will let him know if he can proceed with his next cycle as long as ANC is greater than 1.  Patient has baseline abnormal LFTs since his induction chemotherapy for AML.  They have maxon main and overall stable.  Continue to monitor  Patient complains of headache and nonproductive cough today.  Clinically no signs of infection.  I have asked him to try over-the-counter antihistaminics in addition to Tylenol and see if it helps.  If headaches do not get better over the next 3 to 4 days I will consider getting MRI brain and potentially referral to neurology.  Vidaza itself can be associated with headache but patient has not taken Vidaza in about 2 weeks now because of his neutropenia    Visit Diagnosis 1. Acute myeloid leukemia in remission ([place])   2. Abnormal LFTs   3. High risk medication use      Dr. Randa Evens, MD, MPH [facility] [phone] 05/17/2021 12:23 PM

## 2021-05-17 NOTE — Progress Notes (Signed)
Pt states he has been having a hedaache on his left side since Saturday; will go away and come back but says he it is painful Not usre if it is BP realated or diet. Has been taking tylenol but states headache comes back after a few hours

## 2021-06-14 ENCOUNTER — Telehealth: Payer: Self-pay

## 2021-06-14 ENCOUNTER — Inpatient Hospital Stay: Payer: No Typology Code available for payment source | Attending: Oncology

## 2021-06-14 ENCOUNTER — Other Ambulatory Visit: Payer: Self-pay

## 2021-06-14 DIAGNOSIS — C9201 Acute myeloblastic leukemia, in remission: Secondary | ICD-10-CM | POA: Diagnosis not present

## 2021-06-14 DIAGNOSIS — Z452 Encounter for adjustment and management of vascular access device: Secondary | ICD-10-CM | POA: Diagnosis not present

## 2021-06-14 LAB — COMPREHENSIVE METABOLIC PANEL
ALT: 79 U/L — ABNORMAL HIGH (ref 0–44)
AST: 41 U/L (ref 15–41)
Albumin: 4.6 g/dL (ref 3.5–5.0)
Alkaline Phosphatase: 103 U/L (ref 38–126)
Anion gap: 7 (ref 5–15)
BUN: 14 mg/dL (ref 6–20)
CO2: 25 mmol/L (ref 22–32)
Calcium: 9.1 mg/dL (ref 8.9–10.3)
Chloride: 102 mmol/L (ref 98–111)
Creatinine, Ser: 1.16 mg/dL (ref 0.61–1.24)
GFR, Estimated: >60 mL/min (ref >60)
Glucose, Bld: 135 mg/dL — ABNORMAL HIGH (ref 70–99)
Potassium: 4 mmol/L (ref 3.5–5.1)
Sodium: 134 mmol/L — ABNORMAL LOW (ref 135–145)
Total Bilirubin: 1 mg/dL (ref 0.3–1.2)
Total Protein: 7.6 g/dL (ref 6.5–8.1)

## 2021-06-14 LAB — CBC WITH DIFFERENTIAL/PLATELET
Abs Immature Granulocytes: 0.02 10*3/uL (ref 0.00–0.07)
Basophils Absolute: 0.1 10*3/uL (ref 0.0–0.1)
Basophils Relative: 2 %
Eosinophils Absolute: 0.1 10*3/uL (ref 0.0–0.5)
Eosinophils Relative: 2 %
HCT: 43.2 % (ref 39.0–52.0)
Hemoglobin: 14.7 g/dL (ref 13.0–17.0)
Immature Granulocytes: 1 %
Lymphocytes Relative: 28 %
Lymphs Abs: 0.9 10*3/uL (ref 0.7–4.0)
MCH: 31.1 pg (ref 26.0–34.0)
MCHC: 34 g/dL (ref 30.0–36.0)
MCV: 91.3 fL (ref 80.0–100.0)
Monocytes Absolute: 0.2 10*3/uL (ref 0.1–1.0)
Monocytes Relative: 6 %
Neutro Abs: 2.1 10*3/uL (ref 1.7–7.7)
Neutrophils Relative %: 61 %
Platelets: 190 10*3/uL (ref 150–400)
RBC: 4.73 MIL/uL (ref 4.22–5.81)
RDW: 14.1 % (ref 11.5–15.5)
WBC: 3.3 10*3/uL — ABNORMAL LOW (ref 4.0–10.5)
nRBC: 0 % (ref 0.0–0.2)

## 2021-06-14 MED ORDER — SODIUM CHLORIDE 0.9% FLUSH
10.0000 mL | Freq: Once | INTRAVENOUS | Status: AC
  Administered 2021-06-14: 10 mL via INTRAVENOUS
  Filled 2021-06-14: qty 10

## 2021-06-14 MED ORDER — HEPARIN SOD (PORK) LOCK FLUSH 100 UNIT/ML IV SOLN
500.0000 [IU] | Freq: Once | INTRAVENOUS | Status: AC
  Administered 2021-06-14: 500 [IU] via INTRAVENOUS
  Filled 2021-06-14: qty 5

## 2021-06-14 NOTE — Progress Notes (Signed)
Ok will do, so ask Anderson Malta to schedule Vidaza for next apt 11/16?

## 2021-06-14 NOTE — Progress Notes (Signed)
Ok thx.

## 2021-06-14 NOTE — Progress Notes (Signed)
He is on oral vidaza pill

## 2021-06-14 NOTE — Telephone Encounter (Signed)
Nurse Wendelyn Breslow returned call stating that [place] prescription was sent to [facility] with 5 refills. Pt should still have medication available, she provided phone number: [phone]. I will reach out and see what is occurring with pts med.

## 2021-06-16 ENCOUNTER — Encounter: Payer: Self-pay | Admitting: Hematology and Oncology

## 2021-06-16 NOTE — Telephone Encounter (Signed)
Informed patient Dr Janese Banks advised to resume oral Vidaza, he stated he is completely out. Spoke to [facility] intake requesting to refill. Message forward to Dr. Royce Macadamia requesting to call patient with refill information. Patient informed.

## 2021-06-16 NOTE — Telephone Encounter (Signed)
Informed patient Dr Janese Banks advised to resume oral Vidaza, he stated he is completely out. Spoke to [facility] intake requesting to refill. Message forward to Dr. Royce Macadamia requesting to call patient with refill information. Patient informed.

## 2021-07-12 ENCOUNTER — Inpatient Hospital Stay: Payer: No Typology Code available for payment source | Attending: Oncology

## 2021-07-12 ENCOUNTER — Encounter: Payer: Self-pay | Admitting: Oncology

## 2021-07-12 ENCOUNTER — Other Ambulatory Visit: Payer: Self-pay

## 2021-07-12 ENCOUNTER — Inpatient Hospital Stay (HOSPITAL_BASED_OUTPATIENT_CLINIC_OR_DEPARTMENT_OTHER): Payer: No Typology Code available for payment source | Admitting: Oncology

## 2021-07-12 ENCOUNTER — Inpatient Hospital Stay: Payer: No Typology Code available for payment source

## 2021-07-12 VITALS — BP 122/68 | HR 81 | Temp 97.3°F | Resp 18 | Wt 218.8 lb

## 2021-07-12 DIAGNOSIS — C9201 Acute myeloblastic leukemia, in remission: Secondary | ICD-10-CM | POA: Insufficient documentation

## 2021-07-12 DIAGNOSIS — R7989 Other specified abnormal findings of blood chemistry: Secondary | ICD-10-CM

## 2021-07-12 DIAGNOSIS — Z79899 Other long term (current) drug therapy: Secondary | ICD-10-CM

## 2021-07-12 LAB — CBC WITH DIFFERENTIAL/PLATELET
Abs Immature Granulocytes: 0 10*3/uL (ref 0.00–0.07)
Basophils Absolute: 0.1 10*3/uL (ref 0.0–0.1)
Basophils Relative: 3 %
Eosinophils Absolute: 0 10*3/uL (ref 0.0–0.5)
Eosinophils Relative: 1 %
HCT: 41.4 % (ref 39.0–52.0)
Hemoglobin: 14 g/dL (ref 13.0–17.0)
Immature Granulocytes: 0 %
Lymphocytes Relative: 27 %
Lymphs Abs: 0.7 10*3/uL (ref 0.7–4.0)
MCH: 30.8 pg (ref 26.0–34.0)
MCHC: 33.8 g/dL (ref 30.0–36.0)
MCV: 91 fL (ref 80.0–100.0)
Monocytes Absolute: 0.1 10*3/uL (ref 0.1–1.0)
Monocytes Relative: 4 %
Neutro Abs: 1.7 10*3/uL (ref 1.7–7.7)
Neutrophils Relative %: 65 %
Platelets: 149 10*3/uL — ABNORMAL LOW (ref 150–400)
RBC: 4.55 MIL/uL (ref 4.22–5.81)
RDW: 13.3 % (ref 11.5–15.5)
WBC: 2.6 10*3/uL — ABNORMAL LOW (ref 4.0–10.5)
nRBC: 0 % (ref 0.0–0.2)

## 2021-07-12 LAB — COMPREHENSIVE METABOLIC PANEL
ALT: 86 U/L — ABNORMAL HIGH (ref 0–44)
AST: 42 U/L — ABNORMAL HIGH (ref 15–41)
Albumin: 3.9 g/dL (ref 3.5–5.0)
Alkaline Phosphatase: 102 U/L (ref 38–126)
Anion gap: 10 (ref 5–15)
BUN: 15 mg/dL (ref 6–20)
CO2: 24 mmol/L (ref 22–32)
Calcium: 8.4 mg/dL — ABNORMAL LOW (ref 8.9–10.3)
Chloride: 97 mmol/L — ABNORMAL LOW (ref 98–111)
Creatinine, Ser: 1.1 mg/dL (ref 0.61–1.24)
GFR, Estimated: >60 mL/min (ref >60)
Glucose, Bld: 216 mg/dL — ABNORMAL HIGH (ref 70–99)
Potassium: 3.6 mmol/L (ref 3.5–5.1)
Sodium: 131 mmol/L — ABNORMAL LOW (ref 135–145)
Total Bilirubin: 0.8 mg/dL (ref 0.3–1.2)
Total Protein: 7 g/dL (ref 6.5–8.1)

## 2021-07-12 NOTE — Progress Notes (Signed)
Hematology/Oncology Consult note [facility]  [phone] [phone]  Patient Care Team: Pcp, No as PCP - General [name], MD as Consulting Physician (Oncology)   Name of the patient: Carl [name]  [id number]  May 29, 1976   Date of visit: 07/12/21  Diagnosis-history of AML in remission  Chief complaint/ Reason for visit-routine follow-up of AML on oral Vidaza  Heme/Onc history: Carl [name]. 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 Gene Coding Predicted Protein Variant allele fraction  U2AF1 c.101C>T p.(Ser34Phe) 12.9 %  RUNX1 c.620_621insATCCCCCG p.(Gln208Serfs*6) 6.7 %  NRAS c.38G>A p.(Gly13Asp) 9.2 %   Variants of Unknown Clinical Significance:  Gene Coding Predicted Protein Variant allele fraction  TET2 c.4493G>A p.(Arg1498His) 48.2 %  ETV6 c.776G>A p.(Arg259Gln) 49.1 %   Pertinent Phenotypic data: blasts express CD7, CD13, CD34, CD38, CD71, CD117, and HLA-DR.   He received cytarabine and daunorubicin (7+3) beginning 04/13/2020 and 05/27/2020.  Course has been complicated by fever and neutropenia with mucositis (04/27/2020) and gluteal cleft cellulitis (05/06/2020).Received induction with 7+3+HD with delayed count recovery. D42 BMBx revealed persistent disease and patient re-induced with 7+3.  He then went on to receive consolidation therapy with HiDAC and cycle 4 received on 11/04/2020.  He follows up with Dr. Royce Macadamia at [facility]. Last bone marrow biopsy on 12/05/2020 which showed hypercellular bone marrow with trilineage hematopoiesis and 3% blasts by manual differential.   He is presently on azacitidine p.o. 300 mg daily 2 weeks on 2 weeks off.    Interval history-patient is tolerating Vidaza well without any significant side effects.  He gets occasional headaches but not as frequently as before.  Presently reports pain during swallowing.  Denies any cough or sputum production or  fever.  ECOG PS- 1 Pain scale- 0   Review of systems- Review of Systems  Constitutional:  Negative for chills, fever, malaise/fatigue and weight loss.  HENT:  Positive for sore throat. Negative for congestion, ear discharge and nosebleeds.   Eyes:  Negative for blurred vision.  Respiratory:  Negative for cough, hemoptysis, sputum production, shortness of breath and wheezing.   Cardiovascular:  Negative for chest pain, palpitations, orthopnea and claudication.  Gastrointestinal:  Negative for abdominal pain, blood in stool, constipation, diarrhea, heartburn, melena, nausea and vomiting.  Genitourinary:  Negative for dysuria, flank pain, frequency, hematuria and urgency.  Musculoskeletal:  Negative for back pain, joint pain and myalgias.  Skin:  Negative for rash.  Neurological:  Negative for dizziness, tingling, focal weakness, seizures, weakness and headaches.  Endo/Heme/Allergies:  Does not bruise/bleed easily.  Psychiatric/Behavioral:  Negative for depression and suicidal ideas. The patient does not have insomnia.       Allergies  Allergen Reactions   Dapsone     Contraindication - G6PD deficient   Primaquine     Contraindication - G6PD deficient   Rasburicase     Contraindication - G6PD deficient     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[place] [name])    Agranulocytosis secondary to cancer chemotherapy (CODE) ([place])    Chemotherapy induced neutropenia (HCC)    Pancreatitis    Pancytopenia (HCC)    Thrombocytopenia (HCC)    Tobacco use disorder      No past surgical history on file.  Social History   Socioeconomic History   Marital status: Single    Spouse name: Not on file   Number of children: Not on file   Years  of education: Not on file   Highest education level: Not on file  Occupational History   Not on file  Tobacco Use   Smoking status: Former    Packs/day: 1.00    Years: 17.00    Pack years: 17.00    Types:  Cigarettes    Quit date: 03/27/2020    Years since quitting: 1.2   Smokeless tobacco: Never  Vaping Use   Vaping Use: Never used  Substance and Sexual Activity   Alcohol use: Not Currently    Alcohol/week: 6.0 standard drinks    Types: 6 Cans of beer per week    Comment: 6-24oz beers on weekly bases   Drug use: Never    Types: Marijuana   Sexual activity: Not on file  Other Topics Concern   Not on file  Social History Narrative   Not on file   Social Determinants of Health   Financial Resource Strain: Not on file  Food Insecurity: Not on file  Transportation Needs: Not on file  Physical Activity: Not on file  Stress: Not on file  Social Connections: Not on file  Intimate Partner Violence: Not on file    Family History  Problem Relation Age of Onset   Prostate cancer Neg Hx    Kidney cancer Neg Hx    Bladder Cancer Neg Hx      Current Outpatient Medications:    lidocaine-prilocaine (EMLA) cream, Apply 1 application topically as needed., Disp: 30 g, Rfl: 0   Multiple Vitamin (MULTIVITAMIN WITH MINERALS) TABS tablet, Take 1 tablet by mouth daily., Disp: , Rfl:    ondansetron (ZOFRAN) 8 MG tablet, Take 1 tablet 30 minutes before each dose of Onureg. Can take additional doses every 8 hours as needed if having nausea., Disp: , Rfl:    valACYclovir (VALTREX) 500 MG tablet, Take 500 mg by mouth daily., Disp: , Rfl:    azaCITIDine 300 MG TABS, Take by mouth. (Patient not taking: Reported on 07/12/2021), Disp: , Rfl:    cyanocobalamin 1000 MCG tablet, Take 1,000 mcg by mouth daily. (Patient not taking: Reported on 07/12/2021), Disp: , Rfl:    cyclobenzaprine (FLEXERIL) 10 MG tablet, Take 1 tablet (10 mg total) by mouth 3 (three) times daily as needed for muscle spasms. (Patient not taking: No sig reported), Disp: 30 tablet, Rfl: 0   levofloxacin (LEVAQUIN) 500 MG tablet, Take by mouth daily. (Patient not taking: Reported on 07/12/2021), Disp: , Rfl:    prochlorperazine (COMPAZINE)  10 MG tablet, Take by mouth. (Patient not taking: No sig reported), Disp: , Rfl:   Physical exam:  Vitals:   07/12/21 0953  BP: 122/68  Pulse: 81  Resp: 18  Temp: (!) 97.3 F (36.3 C)  SpO2: 98%  Weight: 218 lb 12.8 oz (99.2 kg)   Physical Exam HENT:     Mouth/Throat:     Mouth: Mucous membranes are moist.     Pharynx: Oropharynx is clear.  Cardiovascular:     Rate and Rhythm: Normal rate and regular rhythm.     Heart sounds: Normal heart sounds.  Pulmonary:     Effort: Pulmonary effort is normal.     Breath sounds: Normal breath sounds.  Abdominal:     General: Bowel sounds are normal.     Palpations: Abdomen is soft.  Lymphadenopathy:     Comments: No palpable cervical adenopathy  Skin:    General: Skin is warm and dry.  Neurological:     Mental Status: He is  alert and oriented to person, place, and time.     CMP Latest Ref Rng & Units 07/12/2021  Glucose 70 - 99 mg/dL 216(H)  BUN 6 - 20 mg/dL 15  Creatinine 0.61 - 1.24 mg/dL 1.10  Sodium 135 - 145 mmol/L 131(L)  Potassium 3.5 - 5.1 mmol/L 3.6  Chloride 98 - 111 mmol/L 97(L)  CO2 22 - 32 mmol/L 24  Calcium 8.9 - 10.3 mg/dL 8.4(L)  Total Protein 6.5 - 8.1 g/dL 7.0  Total Bilirubin 0.3 - 1.2 mg/dL 0.8  Alkaline Phos 38 - 126 U/L 102  AST 15 - 41 U/L 42(H)  ALT 0 - 44 U/L 86(H)   CBC Latest Ref Rng & Units 07/12/2021  WBC 4.0 - 10.5 K/uL 2.6(L)  Hemoglobin 13.0 - 17.0 g/dL 14.0  Hematocrit 39.0 - 52.0 % 41.4  Platelets 150 - 400 K/uL 149(L)   Assessment and plan- Patient is a 45 y.o. male  with history of AML s/p induction chemo and 4 cycles of Hi DAC currently on maintenance azacitidine 2 weeks on 2 weeks off.  This is a routine follow-up visit of AML on oral Vidaza  Patient has mildly abnormal LFTs which have been ongoing for the last 6 to 7 months.  No clear rising trend.  Continue to monitor  Neutropenia/mild thrombocytopenia secondary to [place].  Platelet counts are more than 50 and ANC more than 1.   Therefore okay to proceed with Vidaza which she will start taking in 2 days time.  Port labs CBC with differential CMP in 4 weeks and I will see him back in 8 weeks   Visit Diagnosis 1. Acute myeloid leukemia in remission ([place])   2. High risk medication use      Dr. Randa Evens, MD, MPH [facility] [phone] 07/12/2021 3:38 PM

## 2021-07-12 NOTE — Progress Notes (Signed)
Pt states the side of his neck started feeling sore about two days ago, when he swallows it feels uncomfortable. As well as, for the past week or so pt has been having vision problems, seeing double of everything, states he has to blink about three times for everything to look clear again.

## 2021-08-02 ENCOUNTER — Telehealth: Payer: Self-pay | Admitting: *Deleted

## 2021-08-02 ENCOUNTER — Telehealth: Payer: Self-pay

## 2021-08-02 NOTE — Telephone Encounter (Signed)
Carl [name] at [facility] wanted [name] to be aware that pts provider has left [facility] and will now see Dr. Janene Madeira. Dr. Janese Banks is okay with plan of care. Confirmed with [name] pts next office visit with Dr. Janese Banks on 09/06/2021. Due to transportation pt was not able to make his appt today with Dr. Janene Madeira and has been r/s for 12/22.

## 2021-08-02 NOTE — Telephone Encounter (Signed)
Carl [name] asking to speak with doctor team nurse regarding plan of care and change in provider from Dr Royce Macadamia to Dr Janene Madeira due to Dr Rubye Beach departure from [facility]. Please return his call [phone]

## 2021-08-03 ENCOUNTER — Encounter: Payer: Self-pay | Admitting: Hematology and Oncology

## 2021-08-09 ENCOUNTER — Inpatient Hospital Stay: Payer: No Typology Code available for payment source | Attending: Oncology

## 2021-08-09 ENCOUNTER — Other Ambulatory Visit: Payer: Self-pay

## 2021-08-09 DIAGNOSIS — I8393 Asymptomatic varicose veins of bilateral lower extremities: Secondary | ICD-10-CM | POA: Diagnosis not present

## 2021-08-09 DIAGNOSIS — D696 Thrombocytopenia, unspecified: Secondary | ICD-10-CM | POA: Insufficient documentation

## 2021-08-09 DIAGNOSIS — N50819 Testicular pain, unspecified: Secondary | ICD-10-CM | POA: Diagnosis present

## 2021-08-09 DIAGNOSIS — C9201 Acute myeloblastic leukemia, in remission: Secondary | ICD-10-CM | POA: Diagnosis not present

## 2021-08-09 LAB — COMPREHENSIVE METABOLIC PANEL
ALT: 82 U/L — ABNORMAL HIGH (ref 0–44)
AST: 40 U/L (ref 15–41)
Albumin: 4 g/dL (ref 3.5–5.0)
Alkaline Phosphatase: 98 U/L (ref 38–126)
Anion gap: 8 (ref 5–15)
BUN: 12 mg/dL (ref 6–20)
CO2: 23 mmol/L (ref 22–32)
Calcium: 9.1 mg/dL (ref 8.9–10.3)
Chloride: 103 mmol/L (ref 98–111)
Creatinine, Ser: 1.1 mg/dL (ref 0.61–1.24)
GFR, Estimated: >60 mL/min (ref >60)
Glucose, Bld: 153 mg/dL — ABNORMAL HIGH (ref 70–99)
Potassium: 4.1 mmol/L (ref 3.5–5.1)
Sodium: 134 mmol/L — ABNORMAL LOW (ref 135–145)
Total Bilirubin: 1 mg/dL (ref 0.3–1.2)
Total Protein: 7.2 g/dL (ref 6.5–8.1)

## 2021-08-09 LAB — CBC WITH DIFFERENTIAL/PLATELET
Abs Immature Granulocytes: 0.02 10*3/uL (ref 0.00–0.07)
Basophils Absolute: 0 10*3/uL (ref 0.0–0.1)
Basophils Relative: 1 %
Eosinophils Absolute: 0.1 10*3/uL (ref 0.0–0.5)
Eosinophils Relative: 2 %
HCT: 43.6 % (ref 39.0–52.0)
Hemoglobin: 14.5 g/dL (ref 13.0–17.0)
Immature Granulocytes: 1 %
Lymphocytes Relative: 26 %
Lymphs Abs: 0.7 10*3/uL (ref 0.7–4.0)
MCH: 30 pg (ref 26.0–34.0)
MCHC: 33.3 g/dL (ref 30.0–36.0)
MCV: 90.3 fL (ref 80.0–100.0)
Monocytes Absolute: 0.3 10*3/uL (ref 0.1–1.0)
Monocytes Relative: 12 %
Neutro Abs: 1.7 10*3/uL (ref 1.7–7.7)
Neutrophils Relative %: 58 %
Platelets: 123 10*3/uL — ABNORMAL LOW (ref 150–400)
RBC: 4.83 MIL/uL (ref 4.22–5.81)
RDW: 13.4 % (ref 11.5–15.5)
WBC: 2.8 10*3/uL — ABNORMAL LOW (ref 4.0–10.5)
nRBC: 0 % (ref 0.0–0.2)

## 2021-08-09 MED ORDER — HEPARIN SOD (PORK) LOCK FLUSH 100 UNIT/ML IV SOLN
500.0000 [IU] | Freq: Once | INTRAVENOUS | Status: AC
  Administered 2021-08-09: 09:00:00 500 [IU] via INTRAVENOUS
  Filled 2021-08-09: qty 5

## 2021-08-09 MED ORDER — SODIUM CHLORIDE 0.9% FLUSH
10.0000 mL | Freq: Once | INTRAVENOUS | Status: AC
  Administered 2021-08-09: 09:00:00 10 mL via INTRAVENOUS
  Filled 2021-08-09: qty 10

## 2021-08-10 ENCOUNTER — Inpatient Hospital Stay (HOSPITAL_BASED_OUTPATIENT_CLINIC_OR_DEPARTMENT_OTHER): Payer: No Typology Code available for payment source | Admitting: Hospice and Palliative Medicine

## 2021-08-10 ENCOUNTER — Other Ambulatory Visit: Payer: Self-pay

## 2021-08-10 ENCOUNTER — Inpatient Hospital Stay: Payer: No Typology Code available for payment source

## 2021-08-10 ENCOUNTER — Telehealth: Payer: Self-pay | Admitting: Hospice and Palliative Medicine

## 2021-08-10 VITALS — BP 143/87 | HR 80 | Temp 98.2°F | Resp 18

## 2021-08-10 DIAGNOSIS — I8393 Asymptomatic varicose veins of bilateral lower extremities: Secondary | ICD-10-CM

## 2021-08-10 DIAGNOSIS — N50819 Testicular pain, unspecified: Secondary | ICD-10-CM | POA: Diagnosis not present

## 2021-08-10 DIAGNOSIS — C9201 Acute myeloblastic leukemia, in remission: Secondary | ICD-10-CM

## 2021-08-10 DIAGNOSIS — D696 Thrombocytopenia, unspecified: Secondary | ICD-10-CM | POA: Diagnosis not present

## 2021-08-10 NOTE — Progress Notes (Signed)
Pt reports "knots on both legs" that appeared about a week ago. Denies any pain, itching, redness, or warmth, to these areas. He thought they were insect bites, but they have not resolved. Also reports intermittent pain in groin areas.

## 2021-08-10 NOTE — Telephone Encounter (Signed)
08/10/21~Offered patient same day appt for 08/10/21, and patient declined & requesed appt schd for 08/11/21 due to transportation. MF

## 2021-08-10 NOTE — Progress Notes (Signed)
Symptom Management [place] at [facility] [phone] [phone]  Patient Care Team: Pcp, No as PCP - General [name], MD as Consulting Physician (Oncology)   Name of the patient: Carl [name]  [id number]  04-May-1976   Date of visit: 08/10/21  Reason for Consult:  Stephen Eldra, Word. 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  Patient last saw Dr. Janese Banks on 07/12/2021 and appeared to be doing reasonably well.  He had some mildly abnormal LFTs, neutropenia, and thrombocytopenia, which were being monitored.  Dr. Janese Banks plan to see him back in 2 months.  Patient presents today with two complaints.    He describes the primary issue of having mild testicular pain over the past several days.  He denies fever or chills.  He denies dysuria or urinary frequency/urgency.  He denies testicular swelling or urethral discharge.  He says he had a similar complaint about 5 years ago and was found to have something "twisted" that was subsequently treated by taking a pill.  Patient is sexually active but states that he wears protection and is a monogamous relationship with his fiance for many years.  Patient also describes having noticed slight raised area on bilateral legs.  He denies edema.  He denies pain or pruritus.  No redness or trauma.  Denies any neurologic complaints. Denies recent fevers or illnesses. Denies any easy bleeding or bruising. Reports good appetite and denies weight loss. Denies chest pain. Denies any nausea, vomiting, constipation, or diarrhea. Denies urinary complaints. Patient offers no further specific complaints today.    PAST MEDICAL HISTORY: 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[place])    Agranulocytosis secondary to cancer  chemotherapy (CODE) ([place])    Chemotherapy induced neutropenia (H[facility])    Pancreatitis    Pancytopenia (HCC)    Thrombocytopenia (H[facility])    Tobacco use disorder     PAST SURGICAL HISTORY: No past surgical history on file.  HEMATOLOGY/ONCOLOGY HISTORY:  Oncology History   No history exists.    ALLERGIES:  is allergic to dapsone, primaquine, and rasburicase.  MEDICATIONS:  Current Outpatient Medications  Medication Sig Dispense Refill   azaCITIDine 300 MG TABS Take by mouth. (Patient not taking: Reported on 07/12/2021)     cyanocobalamin 1000 MCG tablet Take 1,000 mcg by mouth daily. (Patient not taking: Reported on 07/12/2021)     cyclobenzaprine (FLEXERIL) 10 MG tablet Take 1 tablet (10 mg total) by mouth 3 (three) times daily as needed for muscle spasms. (Patient not taking: No sig reported) 30 tablet 0   levofloxacin (LEVAQUIN) 500 MG tablet Take by mouth daily. (Patient not taking: Reported on 07/12/2021)     lidocaine-prilocaine (EMLA) cream Apply 1 application topically as needed. 30 g 0   Multiple Vitamin (MULTIVITAMIN WITH MINERALS) TABS tablet Take 1 tablet by mouth daily.     ondansetron (ZOFRAN) 8 MG tablet Take 1 tablet 30 minutes before each dose of Onureg. Can take additional doses every 8 hours as needed if having nausea.     prochlorperazine (COMPAZINE) 10 MG tablet Take by mouth. (Patient not taking: No sig reported)     valACYclovir (VALTREX) 500 MG tablet Take 500 mg by mouth daily.     No current facility-administered medications for this visit.    VITAL SIGNS: BP (!) 143/87  Pulse 80    Temp 98.2 F (36.8 C) (Tympanic)    Resp 18    SpO2 99%  There were no vitals filed for this visit.  Estimated body mass index is 32.31 kg/m as calculated from the following:   Height as of 10/22/20: 5\' 9"  (1.753 m).   Weight as of 07/12/21: 218 lb 12.8 oz (99.2 kg).  LABS: CBC:    Component Value Date/Time   WBC 2.8 (L) 08/09/2021 0903   HGB 14.5 08/09/2021 0903   HCT  43.6 08/09/2021 0903   HCT 44.2 03/29/2020 0826   PLT 123 (L) 08/09/2021 0903   MCV 90.3 08/09/2021 0903   NEUTROABS 1.7 08/09/2021 0903   LYMPHSABS 0.7 08/09/2021 0903   MONOABS 0.3 08/09/2021 0903   EOSABS 0.1 08/09/2021 0903   BASOSABS 0.0 08/09/2021 0903   Comprehensive Metabolic Panel:    Component Value Date/Time   NA 134 (L) 08/09/2021 0903   K 4.1 08/09/2021 0903   CL 103 08/09/2021 0903   CO2 23 08/09/2021 0903   BUN 12 08/09/2021 0903   CREATININE 1.10 08/09/2021 0903   CREATININE 0.94 12/10/2019 0859   GLUCOSE 153 (H) 08/09/2021 0903   CALCIUM 9.1 08/09/2021 0903   AST 40 08/09/2021 0903   ALT 82 (H) 08/09/2021 0903   ALKPHOS 98 08/09/2021 0903   BILITOT 1.0 08/09/2021 0903   PROT 7.2 08/09/2021 0903   ALBUMIN 4.0 08/09/2021 0903    RADIOGRAPHIC STUDIES: No results found.  PERFORMANCE STATUS (ECOG) : 0 - Asymptomatic  Review of Systems Unless otherwise noted, a complete review of systems is negative.  Physical Exam General: NAD Cardiovascular: regular rate and rhythm Pulmonary: clear ant fields Abdomen: soft, nontender, + bowel sounds GU: no suprapubic tenderness, no testicular masses or swelling, no hernia Extremities: no edema, no joint deformities, I do not see any soft tissue swelling to legs, he does have varicosities Skin: no rashes Neurological: Grossly nonfocal  Exam was chaperoned by Benjie Karvonen, RN.   Assessment and Plan- Patient is a 45 y.o. 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 Patient presents to clinic for evaluation of testicular pain and soft tissue masses to legs   Testicular pain -normal exam.  No fever or chills or urinary symptoms at present.  Patient describes a history of "twisting" with similar discomfort about 5 years ago.  Will obtain ultrasound for further evaluation.  We will also send for  UA/culture, will also check for GC/chlamydia PCR.  We will also check CBC.  Varicosities -patient points to areas on both legs that he claims are "knots".  I do not see edema, erythema, or signs of trauma.  Patient does have apparent varicosities.  Discussed that we could always refer him to a vascular surgeon for management if needed but he did not appear overly concerned or wanting further management at the present time.  Will refer patient to establish care with PCP.  Patient to follow-up with Dr. Janese Banks as previously scheduled and may be seen sooner by [facility] if needed   Patient expressed understanding and was in agreement with this plan. He also understands that He can call clinic at any time with any questions, concerns, or complaints.   Thank you for allowing me to participate in the care of this very pleasant patient.   Time Total: 15 minutes  Visit consisted of counseling and education dealing with  the complex and emotionally intense issues of symptom management in the setting of serious illness.Greater than 50%  of this time was spent counseling and coordinating care related to the above assessment and plan.  Signed by: Altha Harm, PhD, NP-C

## 2021-08-11 ENCOUNTER — Ambulatory Visit
Admission: RE | Admit: 2021-08-11 | Discharge: 2021-08-11 | Disposition: A | Discharge status: Home / Self Care | Payer: No Typology Code available for payment source | Source: Ambulatory Visit | Attending: Hospice and Palliative Medicine | Admitting: Hospice and Palliative Medicine

## 2021-08-11 ENCOUNTER — Other Ambulatory Visit: Payer: Self-pay

## 2021-08-11 DIAGNOSIS — C9201 Acute myeloblastic leukemia, in remission: Secondary | ICD-10-CM | POA: Diagnosis not present

## 2021-08-14 ENCOUNTER — Other Ambulatory Visit: Payer: Self-pay

## 2021-08-14 DIAGNOSIS — N50819 Testicular pain, unspecified: Secondary | ICD-10-CM

## 2021-08-15 ENCOUNTER — Inpatient Hospital Stay: Payer: No Typology Code available for payment source

## 2021-08-15 ENCOUNTER — Other Ambulatory Visit: Payer: Self-pay

## 2021-08-15 DIAGNOSIS — N50819 Testicular pain, unspecified: Secondary | ICD-10-CM

## 2021-08-15 DIAGNOSIS — C9201 Acute myeloblastic leukemia, in remission: Secondary | ICD-10-CM

## 2021-08-15 LAB — CBC WITH DIFFERENTIAL/PLATELET
Abs Immature Granulocytes: 0.01 10*3/uL (ref 0.00–0.07)
Basophils Absolute: 0.1 10*3/uL (ref 0.0–0.1)
Basophils Relative: 2 %
Eosinophils Absolute: 0 10*3/uL (ref 0.0–0.5)
Eosinophils Relative: 1 %
HCT: 43.4 % (ref 39.0–52.0)
Hemoglobin: 14.4 g/dL (ref 13.0–17.0)
Immature Granulocytes: 0 %
Lymphocytes Relative: 20 %
Lymphs Abs: 0.8 10*3/uL (ref 0.7–4.0)
MCH: 29.8 pg (ref 26.0–34.0)
MCHC: 33.2 g/dL (ref 30.0–36.0)
MCV: 89.9 fL (ref 80.0–100.0)
Monocytes Absolute: 0.3 10*3/uL (ref 0.1–1.0)
Monocytes Relative: 9 %
Neutro Abs: 2.6 10*3/uL (ref 1.7–7.7)
Neutrophils Relative %: 68 %
Platelets: 168 10*3/uL (ref 150–400)
RBC: 4.83 MIL/uL (ref 4.22–5.81)
RDW: 13.5 % (ref 11.5–15.5)
WBC: 3.8 10*3/uL — ABNORMAL LOW (ref 4.0–10.5)
nRBC: 0 % (ref 0.0–0.2)

## 2021-08-15 LAB — URINALYSIS, COMPLETE (UACMP) WITH MICROSCOPIC
Bilirubin Urine: NEGATIVE
Glucose, UA: NEGATIVE mg/dL
Hgb urine dipstick: NEGATIVE
Ketones, ur: NEGATIVE mg/dL
Leukocytes,Ua: NEGATIVE
Nitrite: NEGATIVE
Protein, ur: NEGATIVE mg/dL
Specific Gravity, Urine: 1.015 (ref 1.005–1.030)
pH: 6.5 (ref 5.0–8.0)

## 2021-08-15 LAB — CHLAMYDIA/NGC RT PCR (ARMC ONLY)
Chlamydia Tr: NOT DETECTED
N gonorrhoeae: NOT DETECTED

## 2021-08-16 ENCOUNTER — Encounter: Payer: Self-pay | Admitting: Hematology and Oncology

## 2021-08-17 LAB — URINE CULTURE: Culture: <10000 — AB

## 2021-08-23 ENCOUNTER — Ambulatory Visit: Payer: BC Managed Care – PPO | Admitting: Urology

## 2021-08-24 ENCOUNTER — Other Ambulatory Visit: Payer: Self-pay | Admitting: *Deleted

## 2021-08-24 DIAGNOSIS — N50819 Testicular pain, unspecified: Secondary | ICD-10-CM

## 2021-08-29 ENCOUNTER — Telehealth: Payer: Self-pay | Admitting: *Deleted

## 2021-08-29 ENCOUNTER — Telehealth: Payer: Self-pay

## 2021-08-29 NOTE — Telephone Encounter (Signed)
Attempted to call patient multiple times with PCP appoitment details, no answer. Details have been mailed to patient.

## 2021-08-29 NOTE — Telephone Encounter (Signed)
-----   Message from Irean Hong, NP sent at 08/29/2021 10:03 AM EST ----- Regarding: RE: PCP Ok. Thank you ----- Message ----- From: Luella Cook, RN Sent: 08/29/2021   9:48 AM EST To: Irean Hong, NP Subject: RE: PCP                                        I got him an appt but it is not til march 8  in [place] ----- Message ----- From: Lillia Corporal, RN Sent: 08/11/2021   2:15 PM EST To: Luella Cook, RN Subject: PCP                                            Judeen Hammans,  Per Merrily Pew, pt needs a pcp. Are you able to assist in finding him one?   Thanks  [employer]

## 2021-08-29 NOTE — Telephone Encounter (Signed)
I have tried to call the pt. Several times and it goes to voicemail and then the message is in [place] and can't leave a voicemail. So today we printed the appt and wrote a note and mailed it to his home letting him know that he has PCp and the appt is not till March and the date , time and address of the PCP is on the paper being sent to him

## 2021-09-01 ENCOUNTER — Ambulatory Visit (INDEPENDENT_AMBULATORY_CARE_PROVIDER_SITE_OTHER): Payer: No Typology Code available for payment source | Admitting: Urology

## 2021-09-01 ENCOUNTER — Encounter: Payer: Self-pay | Admitting: Urology

## 2021-09-01 ENCOUNTER — Other Ambulatory Visit: Payer: Self-pay

## 2021-09-01 VITALS — BP 137/82 | HR 94 | Ht 69.0 in | Wt 215.0 lb

## 2021-09-01 DIAGNOSIS — N5082 Scrotal pain: Secondary | ICD-10-CM | POA: Diagnosis not present

## 2021-09-01 MED ORDER — CELECOXIB 200 MG PO CAPS: 200.0000 mg | ORAL_CAPSULE | Freq: Two times a day (BID) | ORAL | 0 refills | Status: DC | PRN

## 2021-09-01 NOTE — Progress Notes (Signed)
09/01/2021 12:41 PM   Carl [name]. 1976-08-22 [id number]  Referring provider: Irean Hong, NP [place],  [place] [address]  Chief Complaint  Patient presents with   Testicle Pain    HPI: Carl [name]. is a 46 y.o. male referred for evaluation of left scrotal pain.  Seen Symptom Management [place] 08/10/2021 with a several day history of left hemiscrotal pain.  Pain intermittent and described as a dull ache without identifiable precipitating, aggravating or alleviating factors Scrotal sonogram performed 08/11/2021 which showed normal-appearing testes and a small right spermatocele.  Left varicocele incidentally noted.  Normal testicular blood flow bilaterally I saw him in 2019 for similar symptoms which resolved with an NSAID course   PMH: 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[place])    Agranulocytosis secondary to cancer chemotherapy (CODE) ([place])    Chemotherapy induced neutropenia (HCC)    Pancreatitis    Pancytopenia (HCC)    Thrombocytopenia ([place])    Tobacco use disorder     Surgical History: History reviewed. No pertinent surgical history.  Home Medications:  Allergies as of 09/01/2021       Reactions   Dapsone    Contraindication - G6PD deficient   Primaquine    Contraindication - G6PD deficient   Rasburicase    Contraindication - G6PD deficient        Medication List        Accurate as of September 01, 2021 12:41 PM. If you have any questions, ask your nurse or doctor.          STOP taking these medications    cyanocobalamin 1000 MCG tablet Stopped by: Abbie Sons, MD   cyclobenzaprine 10 MG tablet Commonly known as: FLEXERIL Stopped by: Abbie Sons, MD   levofloxacin 500 MG tablet Commonly known as: LEVAQUIN Stopped by: Abbie Sons, MD   multivitamin with minerals Tabs tablet Stopped by: Abbie Sons, MD   prochlorperazine 10 MG  tablet Commonly known as: COMPAZINE Stopped by: Abbie Sons, MD       TAKE these medications    azaCITIDine 300 MG Tabs Take by mouth.   lidocaine-prilocaine cream Commonly known as: EMLA Apply 1 application topically as needed.   ondansetron 8 MG tablet Commonly known as: ZOFRAN Take 1 tablet 30 minutes before each dose of Onureg. Can take additional doses every 8 hours as needed if having nausea.   valACYclovir 500 MG tablet Commonly known as: VALTREX Take 500 mg by mouth daily.        Allergies:  Allergies  Allergen Reactions   Dapsone     Contraindication - G6PD deficient   Primaquine     Contraindication - G6PD deficient   Rasburicase     Contraindication - G6PD deficient    Family History: Family History  Problem Relation Age of Onset   Prostate cancer Neg Hx    Kidney cancer Neg Hx    Bladder Cancer Neg Hx     Social History:  reports that he quit smoking about 17 months ago. His smoking use included cigarettes. He has a 17.00 pack-year smoking history. He has never used smokeless tobacco. He reports that he does not currently use alcohol after a past usage of about 6.0 standard drinks per week. He reports that he does not use drugs.   Physical Exam: BP 137/82    Pulse 94    Ht 5\' 9"  (1.753 m)  Wt 215 lb (97.5 kg)    BMI 31.75 kg/m   Constitutional:  Alert and oriented, No acute distress. HEENT: [place] AT, moist mucus membranes.  Trachea midline, no masses. Cardiovascular: No clubbing, cyanosis, or edema. Respiratory: Normal respiratory effort, no increased work of breathing. GI: Abdomen is soft, nontender, nondistended, no abdominal masses GU: Phallus without lesions.  Testes descended bilaterally without masses or tenderness.  Spermatic cord/epididymis palpably normal bilaterally.  No varicocele appreciated. Skin: No rashes, bruises or suspicious lesions. Neurologic: Grossly intact, no focal deficits, moving all 4 extremities. Psychiatric:  Normal mood and affect.  Laboratory Data:  Urinalysis 08/15/2021 negative  Pertinent Imaging: Scrotal sonogram 08/11/2021 personally reviewed and interpreted    Assessment & Plan:    1.  Left scrotal content pain We discussed that scrotal pain is a common symptom and the etiology is not always identified in a number of cases.  He was reassured that the scrotal ultrasound shows no significant abnormalities Rx Celebrex 200 mg twice daily sent to pharmacy Recommend scrotal support Follow-up prn   Abbie Sons, MD  [place] [address], [place], [place] [address] [phone]

## 2021-09-06 ENCOUNTER — Inpatient Hospital Stay: Payer: No Typology Code available for payment source

## 2021-09-06 ENCOUNTER — Inpatient Hospital Stay: Payer: No Typology Code available for payment source | Attending: Oncology

## 2021-09-06 ENCOUNTER — Other Ambulatory Visit: Payer: Self-pay

## 2021-09-06 ENCOUNTER — Encounter: Payer: Self-pay | Admitting: Oncology

## 2021-09-06 ENCOUNTER — Inpatient Hospital Stay (HOSPITAL_BASED_OUTPATIENT_CLINIC_OR_DEPARTMENT_OTHER): Payer: No Typology Code available for payment source | Admitting: Oncology

## 2021-09-06 VITALS — BP 115/81 | HR 80 | Temp 99.1°F | Resp 16 | Ht 69.0 in | Wt 222.9 lb

## 2021-09-06 DIAGNOSIS — C9201 Acute myeloblastic leukemia, in remission: Secondary | ICD-10-CM

## 2021-09-06 DIAGNOSIS — Z79899 Other long term (current) drug therapy: Secondary | ICD-10-CM

## 2021-09-06 LAB — COMPREHENSIVE METABOLIC PANEL
ALT: 63 U/L — ABNORMAL HIGH (ref 0–44)
AST: 31 U/L (ref 15–41)
Albumin: 4.1 g/dL (ref 3.5–5.0)
Alkaline Phosphatase: 97 U/L (ref 38–126)
Anion gap: 5 (ref 5–15)
BUN: 6 mg/dL (ref 6–20)
CO2: 25 mmol/L (ref 22–32)
Calcium: 8.4 mg/dL — ABNORMAL LOW (ref 8.9–10.3)
Chloride: 104 mmol/L (ref 98–111)
Creatinine, Ser: 1.15 mg/dL (ref 0.61–1.24)
GFR, Estimated: >60 mL/min (ref >60)
Glucose, Bld: 132 mg/dL — ABNORMAL HIGH (ref 70–99)
Potassium: 3.8 mmol/L (ref 3.5–5.1)
Sodium: 134 mmol/L — ABNORMAL LOW (ref 135–145)
Total Bilirubin: 0.9 mg/dL (ref 0.3–1.2)
Total Protein: 6.9 g/dL (ref 6.5–8.1)

## 2021-09-06 LAB — CBC WITH DIFFERENTIAL/PLATELET
Abs Immature Granulocytes: 0.01 10*3/uL (ref 0.00–0.07)
Basophils Absolute: 0.1 10*3/uL (ref 0.0–0.1)
Basophils Relative: 2 %
Eosinophils Absolute: 0.2 10*3/uL (ref 0.0–0.5)
Eosinophils Relative: 7 %
HCT: 43.4 % (ref 39.0–52.0)
Hemoglobin: 14.7 g/dL (ref 13.0–17.0)
Immature Granulocytes: 0 %
Lymphocytes Relative: 37 %
Lymphs Abs: 1.1 10*3/uL (ref 0.7–4.0)
MCH: 30.3 pg (ref 26.0–34.0)
MCHC: 33.9 g/dL (ref 30.0–36.0)
MCV: 89.5 fL (ref 80.0–100.0)
Monocytes Absolute: 0.3 10*3/uL (ref 0.1–1.0)
Monocytes Relative: 9 %
Neutro Abs: 1.4 10*3/uL — ABNORMAL LOW (ref 1.7–7.7)
Neutrophils Relative %: 45 %
Platelets: 167 10*3/uL (ref 150–400)
RBC: 4.85 MIL/uL (ref 4.22–5.81)
RDW: 13.8 % (ref 11.5–15.5)
WBC: 3 10*3/uL — ABNORMAL LOW (ref 4.0–10.5)
nRBC: 0 % (ref 0.0–0.2)

## 2021-09-06 NOTE — Progress Notes (Signed)
° ° ° °Hematology/Oncology Consult note °[facility]  °Telephone:([phone] [phone] ° °Patient Care Team: °Pcp, No as PCP - General °Rao, Archana C, MD as Consulting Physician (Oncology)  ° °Name of the patient: Carl [name]  °[id number]  °03/08/1976  ° °Date of visit: 09/06/21 ° °Diagnosis- history of AML in remission ° °Chief complaint/ Reason for visit-routine follow-up of AML on oral Vidaza ° °Heme/Onc history:  Carl Krage Jr. is a 46 y.o. 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 Gene Coding Predicted Protein Variant allele fraction  °U2AF1 c.101C>T p.(Ser34Phe) 12.9 %  °RUNX1 c.620_621insATCCCCCG p.(Gln208Serfs*6) 6.7 %  °NRAS c.38G>A p.(Gly13Asp) 9.2 %  ° °Variants of Unknown Clinical Significance:  °Gene Coding Predicted Protein Variant allele fraction  °TET2 c.4493G>A p.(Arg1498His) 48.2 %  °ETV6 c.776G>A p.(Arg259Gln) 49.1 %  ° °Pertinent Phenotypic data: blasts express CD7, CD13, CD34, CD38, CD71, CD117, and HLA-DR. °  °He received cytarabine and daunorubicin (7+3) beginning 04/13/2020 and 05/27/2020.  Course has been complicated by fever and neutropenia with mucositis (04/27/2020) and gluteal cleft cellulitis (05/06/2020).Received induction with 7+3+HD with delayed count recovery. D42 BMBx revealed persistent disease and patient re-induced with 7+3.  He then went on to receive consolidation therapy with HiDAC and cycle 4 received on 11/04/2020.  He follows up with Dr. Foster at [facility]. Last bone marrow biopsy on 12/05/2020 which showed hypercellular bone marrow with trilineage hematopoiesis and 3% blasts by manual differential. °  °He is presently on azacitidine p.o. 300 mg daily 2 weeks on 2 weeks off.   ° °Interval history-patient will be getting his Vidaza delivery tomorrow for the next cycle.  Overall he is tolerating treatment well without any significant side effects.  He would like to get his port out at this time. ° °ECOG PS- 1 °Pain scale-  0 ° ° °Review of systems- Review of Systems  °Constitutional:  Negative for chills, fever, malaise/fatigue and weight loss.  °HENT:  Negative for congestion, ear discharge and nosebleeds.   °Eyes:  Negative for blurred vision.  °Respiratory:  Negative for cough, hemoptysis, sputum production, shortness of breath and wheezing.   °Cardiovascular:  Negative for chest pain, palpitations, orthopnea and claudication.  °Gastrointestinal:  Negative for abdominal pain, blood in stool, constipation, diarrhea, heartburn, melena, nausea and vomiting.  °Genitourinary:  Negative for dysuria, flank pain, frequency, hematuria and urgency.  °Musculoskeletal:  Negative for back pain, joint pain and myalgias.  °Skin:  Negative for rash.  °Neurological:  Negative for dizziness, tingling, focal weakness, seizures, weakness and headaches.  °Endo/Heme/Allergies:  Does not bruise/bleed easily.  °Psychiatric/Behavioral:  Negative for depression and suicidal ideas. The patient does not have insomnia.    ° ° °Allergies  °Allergen Reactions  ° Dapsone   °  Contraindication - G6PD deficient  ° Primaquine   °  Contraindication - G6PD deficient  ° Rasburicase   °  Contraindication - G6PD deficient  ° ° ° °Past Medical History:  °Diagnosis Date  ° Acute myeloid leukemia in remission ([facility])   ° Agranulocytosis secondary to cancer chemotherapy (CODE) ([facility])   ° Chemotherapy induced neutropenia ([facility])   ° Pancreatitis   ° Pancytopenia (H[facility])   ° Thrombocytopenia ([facility])   ° Tobacco use disorder   ° ° ° °History reviewed. No pertinent surgical history. ° °Social History  ° °Socioeconomic History  ° Marital status: Single  °  Spouse name: Not on file  ° Number of children: Not on file  ° Years of education: Not   Not on file   Highest education level: Not on file  Occupational History   Not on file  Tobacco Use   Smoking status: Former    Packs/day: 1.00    Years: 17.00    Pack years: 17.00    Types: Cigarettes    Quit date: 03/27/2020    Years since  quitting: 1.4   Smokeless tobacco: Never  Vaping Use   Vaping Use: Never used  Substance and Sexual Activity   Alcohol use: Not Currently    Alcohol/week: 6.0 standard drinks    Types: 6 Cans of beer per week    Comment: 6-24oz beers on weekly bases   Drug use: Never    Types: Marijuana   Sexual activity: Not on file  Other Topics Concern   Not on file  Social History Narrative   Not on file   Social Determinants of Health   Financial Resource Strain: Not on file  Food Insecurity: Not on file  Transportation Needs: Not on file  Physical Activity: Not on file  Stress: Not on file  Social Connections: Not on file  Intimate Partner Violence: Not on file    Family History  Problem Relation Age of Onset   Prostate cancer Neg Hx    Kidney cancer Neg Hx    Bladder Cancer Neg Hx      Current Outpatient Medications:    azaCITIDine 300 MG TABS, Take by mouth., Disp: , Rfl:    celecoxib (CELEBREX) 200 MG capsule, Take 1 capsule (200 mg total) by mouth 2 (two) times daily as needed., Disp: 60 capsule, Rfl: 0   lidocaine-prilocaine (EMLA) cream, Apply 1 application topically as needed., Disp: 30 g, Rfl: 0   ondansetron (ZOFRAN) 8 MG tablet, Take 1 tablet 30 minutes before each dose of Onureg. Can take additional doses every 8 hours as needed if having nausea., Disp: , Rfl:    valACYclovir (VALTREX) 500 MG tablet, Take 500 mg by mouth daily., Disp: , Rfl:   Physical exam:  Vitals:   09/06/21 1029  BP: 115/81  Pulse: 80  Resp: 16  Temp: 99.1 F (37.3 C)  TempSrc: Tympanic  SpO2: 100%  Weight: 222 lb 14.4 oz (101.1 kg)  Height: 5' 9" (1.753 m)   Physical Exam Constitutional:      General: He is not in acute distress. Cardiovascular:     Rate and Rhythm: Normal rate and regular rhythm.     Heart sounds: Normal heart sounds.  Pulmonary:     Effort: Pulmonary effort is normal.     Breath sounds: Normal breath sounds.  Abdominal:     General: Bowel sounds are normal.      Palpations: Abdomen is soft.  Skin:    General: Skin is warm and dry.  Neurological:     Mental Status: He is alert and oriented to person, place, and time.     CMP Latest Ref Rng & Units 09/06/2021  Glucose 70 - 99 mg/dL 132(H)  BUN 6 - 20 mg/dL 6  Creatinine 0.61 - 1.24 mg/dL 1.15  Sodium 135 - 145 mmol/L 134(L)  Potassium 3.5 - 5.1 mmol/L 3.8  Chloride 98 - 111 mmol/L 104  CO2 22 - 32 mmol/L 25  Calcium 8.9 - 10.3 mg/dL 8.4(L)  Total Protein 6.5 - 8.1 g/dL 6.9  Total Bilirubin 0.3 - 1.2 mg/dL 0.9  Alkaline Phos 38 - 126 U/L 97  AST 15 - 41 U/L 31  ALT 0 - 44 U/L 63(H)  CBC Latest Ref Rng & Units 09/06/2021  °WBC 4.0 - 10.5 K/uL 3.0(L)  °Hemoglobin 13.0 - 17.0 g/dL 14.7  °Hematocrit 39.0 - 52.0 % 43.4  °Platelets 150 - 400 K/uL 167  ° ° °No images are attached to the encounter. ° °US SCROTUM W/DOPPLER ° °Result Date: 08/13/2021 °CLINICAL DATA:  Intermittent left-sided testicular pain x3 weeks. EXAM: SCROTAL ULTRASOUND DOPPLER ULTRASOUND OF THE TESTICLES TECHNIQUE: Complete ultrasound examination of the testicles, epididymis, and other scrotal structures was performed. Color and spectral Doppler ultrasound were also utilized to evaluate blood flow to the testicles. COMPARISON:  None. FINDINGS: Right testicle Measurements: 3.8 cm x 2.0 cm x 2.4 cm.  No mass is visualized. Left testicle Measurements:  3.4 cm x 1.7 cm x 2.1 cm.  No mass is visualized. Right epididymis: A 3.1 mm x 2.5 mm x 3.4 mm cyst is seen within the right epididymis Left epididymis:  Normal in size and appearance. Hydrocele:  None visualized. Varicocele:  A left-sided varicocele is noted. Pulsed Doppler interrogation of both testes demonstrates normal low resistance arterial and venous waveforms bilaterally. IMPRESSION: 1. Small right epididymal cyst. 2. Left-sided varicocele. 3. Normal bilateral testicular flow. Electronically Signed   By: Thaddeus  Houston M.D.   On: 08/13/2021 03:42   ° ° °Assessment and plan- Patient is a  45 y.o. male  with history of AML s/p induction chemo and 4 cycles of Hi DAC currently on maintenance azacitidine 2 weeks on 2 weeks off.  He is here for routine follow-up ° °Mild stable leukopenia but ANC is greater than 1.  Platelets more than 50.  Okay to start next cycle of magnesium tomorrow which she will take 2 weeks on 2 weeks off.  Patient was also recently seen by Dr. Zeidner at [facility] and plan is to continue oral Vidaza unless there is any concern for progression based on symptoms and labs.  No plans for bone marrow transplant at this time.  His port can be taken out which will be coordinated by Dr. Seidner as well. ° °We will continue to monitor monthly labs for Vidaza and I will see him back in 2 months °  °Visit Diagnosis °1. AML (acute myeloid leukemia) in remission ([facility])   ° ° ° °Dr. Archana Rao, MD, MPH °[facility] °[phone] °09/06/2021 °4:30 PM ° ° ° ° ° ° °    ° ° ° ° ° °

## 2021-09-12 ENCOUNTER — Telehealth: Payer: Self-pay

## 2021-09-12 NOTE — Telephone Encounter (Signed)
Called and left message with operator to pass on regarding arranging port removal with Dr. Merri Ray office as requested by Dr. Janese Banks. Operator passed message to nurse who will return call.

## 2021-09-14 ENCOUNTER — Encounter: Payer: Self-pay | Admitting: Hematology and Oncology

## 2021-09-14 NOTE — Telephone Encounter (Signed)
See previous note 3/25

## 2021-10-04 ENCOUNTER — Other Ambulatory Visit: Payer: Self-pay

## 2021-10-04 ENCOUNTER — Inpatient Hospital Stay: Payer: No Typology Code available for payment source | Attending: Oncology

## 2021-10-04 ENCOUNTER — Inpatient Hospital Stay: Payer: No Typology Code available for payment source

## 2021-10-04 DIAGNOSIS — C9201 Acute myeloblastic leukemia, in remission: Secondary | ICD-10-CM | POA: Insufficient documentation

## 2021-10-04 LAB — CBC WITH DIFFERENTIAL/PLATELET
Abs Immature Granulocytes: 0.01 10*3/uL (ref 0.00–0.07)
Basophils Absolute: 0.1 10*3/uL (ref 0.0–0.1)
Basophils Relative: 2 %
Eosinophils Absolute: 0.1 10*3/uL (ref 0.0–0.5)
Eosinophils Relative: 2 %
HCT: 44 % (ref 39.0–52.0)
Hemoglobin: 14.8 g/dL (ref 13.0–17.0)
Immature Granulocytes: 0 %
Lymphocytes Relative: 22 %
Lymphs Abs: 0.8 10*3/uL (ref 0.7–4.0)
MCH: 29.9 pg (ref 26.0–34.0)
MCHC: 33.6 g/dL (ref 30.0–36.0)
MCV: 88.9 fL (ref 80.0–100.0)
Monocytes Absolute: 0.4 10*3/uL (ref 0.1–1.0)
Monocytes Relative: 11 %
Neutro Abs: 2.2 10*3/uL (ref 1.7–7.7)
Neutrophils Relative %: 63 %
Platelets: 136 10*3/uL — ABNORMAL LOW (ref 150–400)
RBC: 4.95 MIL/uL (ref 4.22–5.81)
RDW: 13.7 % (ref 11.5–15.5)
WBC: 3.5 10*3/uL — ABNORMAL LOW (ref 4.0–10.5)
nRBC: 0 % (ref 0.0–0.2)

## 2021-10-04 LAB — COMPREHENSIVE METABOLIC PANEL
ALT: 80 U/L — ABNORMAL HIGH (ref 0–44)
AST: 38 U/L (ref 15–41)
Albumin: 4.3 g/dL (ref 3.5–5.0)
Alkaline Phosphatase: 108 U/L (ref 38–126)
Anion gap: 8 (ref 5–15)
BUN: 12 mg/dL (ref 6–20)
CO2: 23 mmol/L (ref 22–32)
Calcium: 9 mg/dL (ref 8.9–10.3)
Chloride: 101 mmol/L (ref 98–111)
Creatinine, Ser: 1.04 mg/dL (ref 0.61–1.24)
GFR, Estimated: >60 mL/min (ref >60)
Glucose, Bld: 113 mg/dL — ABNORMAL HIGH (ref 70–99)
Potassium: 3.8 mmol/L (ref 3.5–5.1)
Sodium: 132 mmol/L — ABNORMAL LOW (ref 135–145)
Total Bilirubin: 1.4 mg/dL — ABNORMAL HIGH (ref 0.3–1.2)
Total Protein: 8.1 g/dL (ref 6.5–8.1)

## 2021-10-19 ENCOUNTER — Telehealth: Payer: Self-pay | Admitting: *Deleted

## 2021-10-19 NOTE — Telephone Encounter (Signed)
Got a fax from [facility] that pt will have port removal  on 11/16/2021

## 2021-10-25 ENCOUNTER — Other Ambulatory Visit: Payer: Self-pay | Admitting: Urology

## 2021-10-26 ENCOUNTER — Other Ambulatory Visit: Payer: Self-pay | Admitting: *Deleted

## 2021-10-26 DIAGNOSIS — C9201 Acute myeloblastic leukemia, in remission: Secondary | ICD-10-CM

## 2021-11-01 ENCOUNTER — Ambulatory Visit: Payer: No Typology Code available for payment source | Admitting: Nurse Practitioner

## 2021-11-03 ENCOUNTER — Inpatient Hospital Stay: Payer: No Typology Code available for payment source

## 2021-11-03 ENCOUNTER — Other Ambulatory Visit: Payer: Self-pay

## 2021-11-03 ENCOUNTER — Inpatient Hospital Stay: Payer: No Typology Code available for payment source | Attending: Oncology

## 2021-11-03 ENCOUNTER — Inpatient Hospital Stay (HOSPITAL_BASED_OUTPATIENT_CLINIC_OR_DEPARTMENT_OTHER): Payer: No Typology Code available for payment source | Admitting: Oncology

## 2021-11-03 ENCOUNTER — Encounter: Payer: Self-pay | Admitting: Oncology

## 2021-11-03 DIAGNOSIS — R7989 Other specified abnormal findings of blood chemistry: Secondary | ICD-10-CM | POA: Diagnosis not present

## 2021-11-03 DIAGNOSIS — Z79899 Other long term (current) drug therapy: Secondary | ICD-10-CM

## 2021-11-03 DIAGNOSIS — Z87891 Personal history of nicotine dependence: Secondary | ICD-10-CM | POA: Insufficient documentation

## 2021-11-03 DIAGNOSIS — R229 Localized swelling, mass and lump, unspecified: Secondary | ICD-10-CM | POA: Diagnosis not present

## 2021-11-03 DIAGNOSIS — K920 Hematemesis: Secondary | ICD-10-CM | POA: Diagnosis not present

## 2021-11-03 DIAGNOSIS — M7989 Other specified soft tissue disorders: Secondary | ICD-10-CM | POA: Insufficient documentation

## 2021-11-03 DIAGNOSIS — C9201 Acute myeloblastic leukemia, in remission: Secondary | ICD-10-CM

## 2021-11-03 DIAGNOSIS — D696 Thrombocytopenia, unspecified: Secondary | ICD-10-CM | POA: Diagnosis not present

## 2021-11-03 LAB — COMPREHENSIVE METABOLIC PANEL
ALT: 66 U/L — ABNORMAL HIGH (ref 0–44)
AST: 30 U/L (ref 15–41)
Albumin: 4.1 g/dL (ref 3.5–5.0)
Alkaline Phosphatase: 89 U/L (ref 38–126)
Anion gap: 7 (ref 5–15)
BUN: 13 mg/dL (ref 6–20)
CO2: 24 mmol/L (ref 22–32)
Calcium: 8.8 mg/dL — ABNORMAL LOW (ref 8.9–10.3)
Chloride: 102 mmol/L (ref 98–111)
Creatinine, Ser: 1 mg/dL (ref 0.61–1.24)
GFR, Estimated: >60 mL/min (ref >60)
Glucose, Bld: 240 mg/dL — ABNORMAL HIGH (ref 70–99)
Potassium: 4 mmol/L (ref 3.5–5.1)
Sodium: 133 mmol/L — ABNORMAL LOW (ref 135–145)
Total Bilirubin: 0.8 mg/dL (ref 0.3–1.2)
Total Protein: 7.5 g/dL (ref 6.5–8.1)

## 2021-11-03 LAB — CBC WITH DIFFERENTIAL/PLATELET
Abs Immature Granulocytes: 0.01 10*3/uL (ref 0.00–0.07)
Basophils Absolute: 0 10*3/uL (ref 0.0–0.1)
Basophils Relative: 1 %
Eosinophils Absolute: 0.1 10*3/uL (ref 0.0–0.5)
Eosinophils Relative: 2 %
HCT: 45.3 % (ref 39.0–52.0)
Hemoglobin: 15.2 g/dL (ref 13.0–17.0)
Immature Granulocytes: 0 %
Lymphocytes Relative: 34 %
Lymphs Abs: 1.1 10*3/uL (ref 0.7–4.0)
MCH: 30.5 pg (ref 26.0–34.0)
MCHC: 33.6 g/dL (ref 30.0–36.0)
MCV: 90.8 fL (ref 80.0–100.0)
Monocytes Absolute: 0.3 10*3/uL (ref 0.1–1.0)
Monocytes Relative: 8 %
Neutro Abs: 1.8 10*3/uL (ref 1.7–7.7)
Neutrophils Relative %: 55 %
Platelets: 137 10*3/uL — ABNORMAL LOW (ref 150–400)
RBC: 4.99 MIL/uL (ref 4.22–5.81)
RDW: 13.6 % (ref 11.5–15.5)
WBC: 3.3 10*3/uL — ABNORMAL LOW (ref 4.0–10.5)
nRBC: 0 % (ref 0.0–0.2)

## 2021-11-03 NOTE — Progress Notes (Signed)
? ? ? ?Hematology/Oncology Consult note ?[place]  ?Telephone:(336) [phone] [phone] ? ?Patient Care Team: ?Pcp, No as PCP - General ?[name], MD as Consulting Physician (Oncology)  ? ?Name of the patient: Carl [name]  ?[id number]  ?06-19-1976  ? ?Date of visit: 11/03/21 ? ?Diagnosis- history of AML in remission ? ?Chief complaint/ Reason for visit-routine follow-up of AML on Vidaza ? ?Heme/Onc history: Carl Zaydon Kinser. is a 46 y.o. 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 Gene Coding Predicted Protein Variant allele fraction  ?U2AF1 c.101C>T p.(Ser34Phe) 12.9 %  ?RUNX1 c.620_621insATCCCCCG p.(Gln208Serfs*6) 6.7 %  ?NRAS c.38G>A p.(Gly13Asp) 9.2 %  ? ?Variants of Unknown Clinical Significance:  ?Gene Coding Predicted Protein Variant allele fraction  ?TET2 c.4493G>A p.(Arg1498His) 48.2 %  ?ETV6 c.776G>A p.(Arg259Gln) 49.1 %  ? ?Pertinent Phenotypic data: blasts express CD7, CD13, CD34, CD38, CD71, CD117, and HLA-DR. ?  ?He received cytarabine and daunorubicin (7+3) beginning 04/13/2020 and 05/27/2020.  Course has been complicated by fever and neutropenia with mucositis (04/27/2020) and gluteal cleft cellulitis (05/06/2020).Received induction with 7+3+HD with delayed count recovery. D42 BMBx revealed persistent disease and patient re-induced with 7+3.  He then went on to receive consolidation therapy with HiDAC and cycle 4 received on 11/04/2020.  He follows up with Dr. Royce Macadamia at [facility]. Last bone marrow biopsy on 12/05/2020 which showed hypercellular bone marrow with trilineage hematopoiesis and 3% blasts by manual differential. ?  ?He is presently on azacitidine p.o. 300 mg daily 2 weeks on 2 weeks off.   ?  ? ?Interval history-patient states that a few days ago he had some nausea and retching and felt like he vomited blood a few times.  He has not had it since then.  Reports that sometimes he develops focal swelling in his legs which comes and  goes. ? ?ECOG PS- 1 ?Pain scale- 0 ? ? ?Review of systems- Review of Systems  ?Constitutional:  Positive for malaise/fatigue.   ? ? ?Allergies  ?Allergen Reactions  ? Dapsone   ?  Contraindication - G6PD deficient  ? Primaquine   ?  Contraindication - G6PD deficient  ? Rasburicase   ?  Contraindication - G6PD deficient  ? ? ? ?Past Medical History:  ?Diagnosis Date  ? Acute myeloid leukemia in remission ([place])   ? Agranulocytosis secondary to cancer chemotherapy (CODE) ([place])   ? Chemotherapy induced neutropenia ([facility])   ? Pancreatitis   ? Pancytopenia ([place])   ? Thrombocytopenia ([place])   ? Tobacco use disorder   ? ? ? ?History reviewed. No pertinent surgical history. ? ?Social History  ? ?Socioeconomic History  ? Marital status: Single  ?  Spouse name: Not on file  ? Number of children: Not on file  ? Years of education: Not on file  ? Highest education level: Not on file  ?Occupational History  ? Not on file  ?Tobacco Use  ? Smoking status: Former  ?  Packs/day: 1.00  ?  Years: 17.00  ?  Pack years: 17.00  ?  Types: Cigarettes  ?  Quit date: 03/27/2020  ?  Years since quitting: 1.6  ? Smokeless tobacco: Never  ?Vaping Use  ? Vaping Use: Never used  ?Substance and Sexual Activity  ? Alcohol use: Not Currently  ?  Comment: 2 time a month wine  ? Drug use: Never  ?  Types: Marijuana  ? Sexual activity: Yes  ?Other Topics Concern  ? Not on file  ?Social History Narrative  ?  Not on file  ? ?Social Determinants of Health  ? ?Financial Resource Strain: Not on file  ?Food Insecurity: Not on file  ?Transportation Needs: Not on file  ?Physical Activity: Not on file  ?Stress: Not on file  ?Social Connections: Not on file  ?Intimate Partner Violence: Not on file  ? ? ?Family History  ?Problem Relation Age of Onset  ? Prostate cancer Neg Hx   ? Kidney cancer Neg Hx   ? Bladder Cancer Neg Hx   ? ? ? ?Current Outpatient Medications:  ?  ondansetron (ZOFRAN) 8 MG tablet, Take 1 tablet 30 minutes before each dose of Onureg. Can take  additional doses every 8 hours as needed if having nausea., Disp: , Rfl:  ?  valACYclovir (VALTREX) 500 MG tablet, Take 500 mg by mouth daily., Disp: , Rfl:  ?  azaCITIDine 300 MG TABS, Take by mouth. (Patient not taking: Reported on 11/03/2021), Disp: , Rfl:  ? ?Physical exam:  ?Physical Exam ?Constitutional:   ?   General: He is not in acute distress. ?Cardiovascular:  ?   Rate and Rhythm: Normal rate and regular rhythm.  ?   Heart sounds: Normal heart sounds.  ?Pulmonary:  ?   Effort: Pulmonary effort is normal.  ?   Breath sounds: Normal breath sounds.  ?Abdominal:  ?   General: Bowel sounds are normal. There is no distension.  ?   Palpations: Abdomen is soft.  ?   Tenderness: There is no abdominal tenderness.  ?Musculoskeletal:  ?   Comments: Trace bilateral edema  ?Skin: ?   General: Skin is warm and dry.  ?Neurological:  ?   Mental Status: He is alert and oriented to person, place, and time.  ?  ? ?CMP Latest Ref Rng & Units 11/03/2021  ?Glucose 70 - 99 mg/dL 240(H)  ?BUN 6 - 20 mg/dL 13  ?Creatinine 0.61 - 1.24 mg/dL 1.00  ?Sodium 135 - 145 mmol/L 133(L)  ?Potassium 3.5 - 5.1 mmol/L 4.0  ?Chloride 98 - 111 mmol/L 102  ?CO2 22 - 32 mmol/L 24  ?Calcium 8.9 - 10.3 mg/dL 8.8(L)  ?Total Protein 6.5 - 8.1 g/dL 7.5  ?Total Bilirubin 0.3 - 1.2 mg/dL 0.8  ?Alkaline Phos 38 - 126 U/L 89  ?AST 15 - 41 U/L 30  ?ALT 0 - 44 U/L 66(H)  ? ?CBC Latest Ref Rng & Units 11/03/2021  ?WBC 4.0 - 10.5 K/uL 3.3(L)  ?Hemoglobin 13.0 - 17.0 g/dL 15.2  ?Hematocrit 39.0 - 52.0 % 45.3  ?Platelets 150 - 400 K/uL 137(L)  ? ? ? ?Assessment and plan- Patient is a 46 y.o. male with history of AML s/p induction chemo and 4 cycles of Hi DAC currently on maintenance oral azacitidine 2 weeks on 2 weeks off.  He is here for routine follow-up ? ?Overall patient has mildLeukopenia and thrombocytopenia which is overall stable.  ANC remains more than 1 and platelets more than 50.  Hemoglobin is normal.  We will therefore continue Vidaza 2 weeks on and 2  weeks off which she is getting [facility].  He is supposed to receive his shipment by early next week. ? ?Patient does not have any drop in his hemoglobin despite a self-limited episode of presumed hematemesis.  He has not had any recurring symptoms since then.  Continue to monitor ? ?Abnormal LFTs: Patient has a mildly abnormal AST ALT which has remained stable overall.  Continue to monitor ? ?CBC with differential, CMP in 1 month 2 months  in 3 months and I will see him back in 3 months ?  ?Visit Diagnosis ?1. Acute myeloid leukemia in remission ([place])   ?2. High risk medication use   ? ? ? ?Dr. Randa Evens, MD, MPH ?[facility] ?[phone] ?11/03/2021 ?4:05 PM ? ? ? ? ? ? ?    ? ? ? ? ? ?

## 2021-12-04 ENCOUNTER — Inpatient Hospital Stay: Payer: No Typology Code available for payment source

## 2021-12-04 ENCOUNTER — Inpatient Hospital Stay: Payer: No Typology Code available for payment source | Attending: Oncology

## 2021-12-04 DIAGNOSIS — C9201 Acute myeloblastic leukemia, in remission: Secondary | ICD-10-CM

## 2021-12-04 LAB — CBC WITH DIFFERENTIAL/PLATELET
Abs Immature Granulocytes: 0.01 10*3/uL (ref 0.00–0.07)
Basophils Absolute: 0.1 10*3/uL (ref 0.0–0.1)
Basophils Relative: 1 %
Eosinophils Absolute: 0.1 10*3/uL (ref 0.0–0.5)
Eosinophils Relative: 2 %
HCT: 45.4 % (ref 39.0–52.0)
Hemoglobin: 15.3 g/dL (ref 13.0–17.0)
Immature Granulocytes: 0 %
Lymphocytes Relative: 34 %
Lymphs Abs: 1.3 10*3/uL (ref 0.7–4.0)
MCH: 30.1 pg (ref 26.0–34.0)
MCHC: 33.7 g/dL (ref 30.0–36.0)
MCV: 89.4 fL (ref 80.0–100.0)
Monocytes Absolute: 0.4 10*3/uL (ref 0.1–1.0)
Monocytes Relative: 11 %
Neutro Abs: 2 10*3/uL (ref 1.7–7.7)
Neutrophils Relative %: 52 %
Platelets: 164 10*3/uL (ref 150–400)
RBC: 5.08 MIL/uL (ref 4.22–5.81)
RDW: 13.3 % (ref 11.5–15.5)
WBC: 3.9 10*3/uL — ABNORMAL LOW (ref 4.0–10.5)
nRBC: 0.5 % — ABNORMAL HIGH (ref 0.0–0.2)

## 2021-12-04 LAB — COMPREHENSIVE METABOLIC PANEL
ALT: 63 U/L — ABNORMAL HIGH (ref 0–44)
AST: 35 U/L (ref 15–41)
Albumin: 4.1 g/dL (ref 3.5–5.0)
Alkaline Phosphatase: 91 U/L (ref 38–126)
Anion gap: 9 (ref 5–15)
BUN: 15 mg/dL (ref 6–20)
CO2: 24 mmol/L (ref 22–32)
Calcium: 9 mg/dL (ref 8.9–10.3)
Chloride: 101 mmol/L (ref 98–111)
Creatinine, Ser: 1.27 mg/dL — ABNORMAL HIGH (ref 0.61–1.24)
GFR, Estimated: >60 mL/min (ref >60)
Glucose, Bld: 158 mg/dL — ABNORMAL HIGH (ref 70–99)
Potassium: 3.8 mmol/L (ref 3.5–5.1)
Sodium: 134 mmol/L — ABNORMAL LOW (ref 135–145)
Total Bilirubin: 0.8 mg/dL (ref 0.3–1.2)
Total Protein: 7.4 g/dL (ref 6.5–8.1)

## 2021-12-23 IMAGING — CT CT ABD-PELV W/ CM
2 of 5 series · 14 of 46 positions shown, 16 images · IV contrast (APPLIED)
Comparison: Chest x-ray 03/29/2020
COMPARISON: Chest x-ray 03/29/2020

Addendum:
CLINICAL DATA: Abdomen pain with fever

EXAM:
CT ABDOMEN AND PELVIS WITH CONTRAST
TECHNIQUE: Multidetector CT imaging of the abdomen and pelvis was performed
using the standard protocol following bolus administration of
intravenous contrast.
CONTRAST:  100mL OMNIPAQUE IOHEXOL 300 MG/ML  SOLN

[Series 2: routine abd/pel with · axial · 0.70mm/px · z∈[-1203,-798]mm · 11 of 93 slices shown, 13 images]
[im 6/93  soft-tissue]
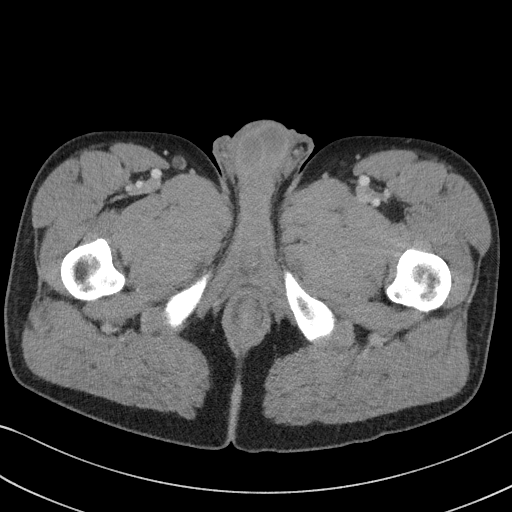
[im 6/93  bone]
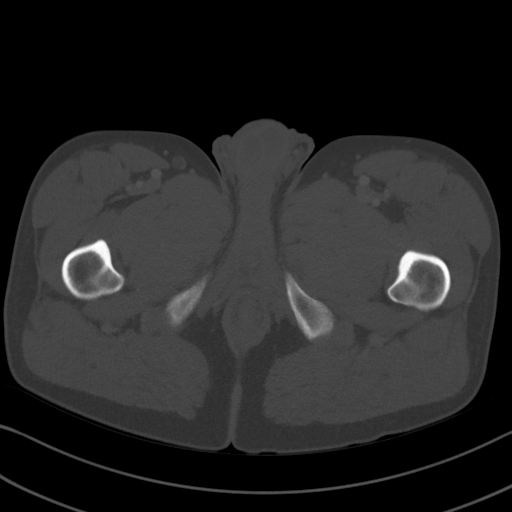
[im 16/93  soft-tissue]
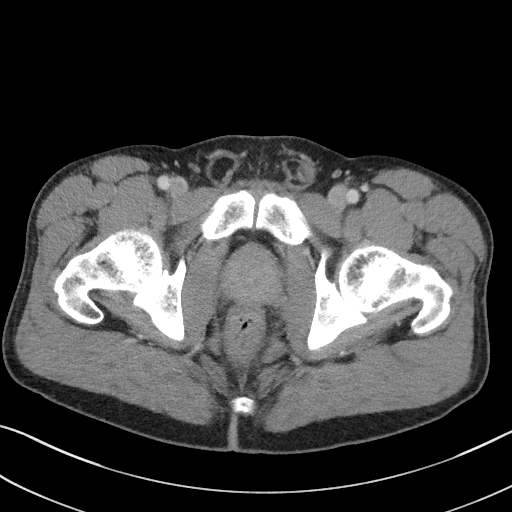
[im 21/93  soft-tissue]
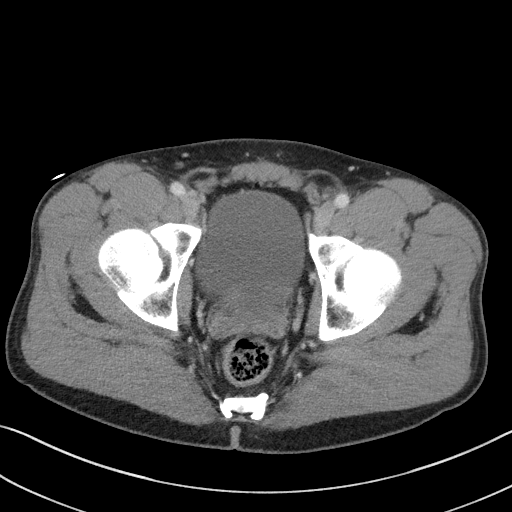
[im 31/93  soft-tissue]
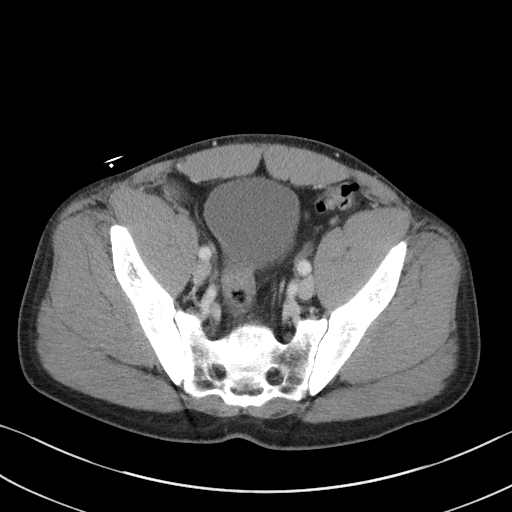
[im 36/93  soft-tissue]
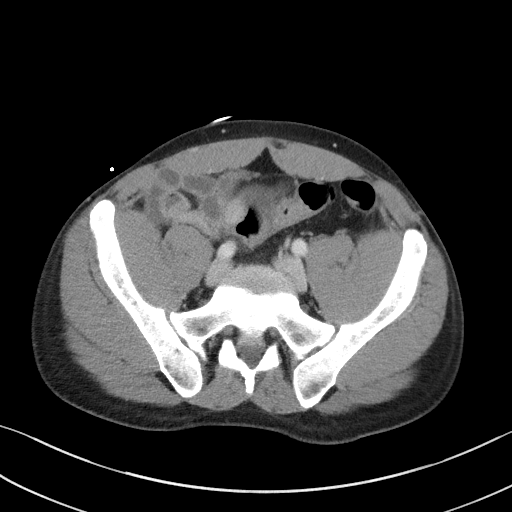
[im 47/93  soft-tissue]
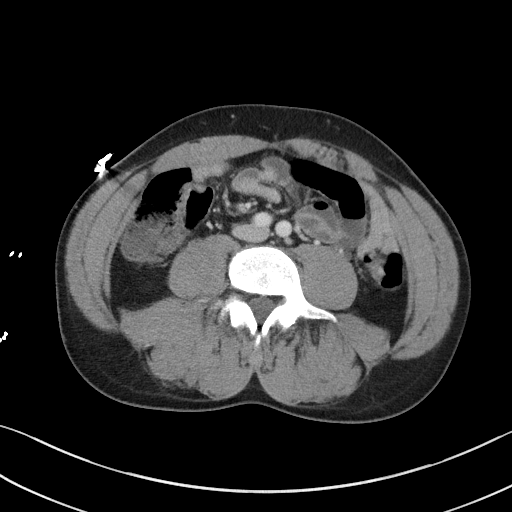
[im 57/93  soft-tissue]
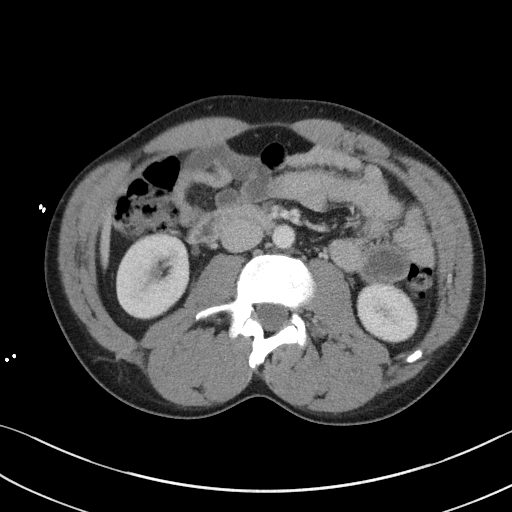
[im 62/93  soft-tissue]
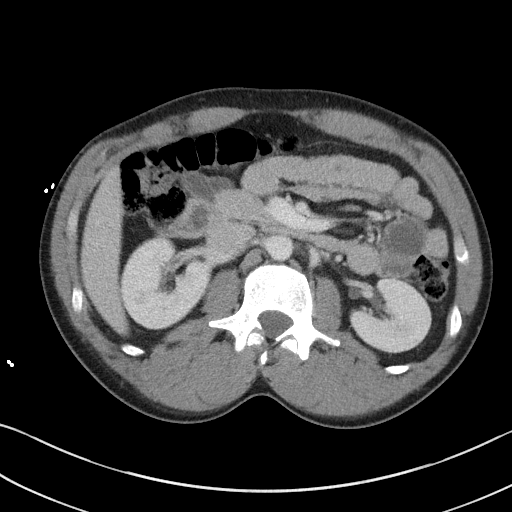
[im 72/93  soft-tissue]
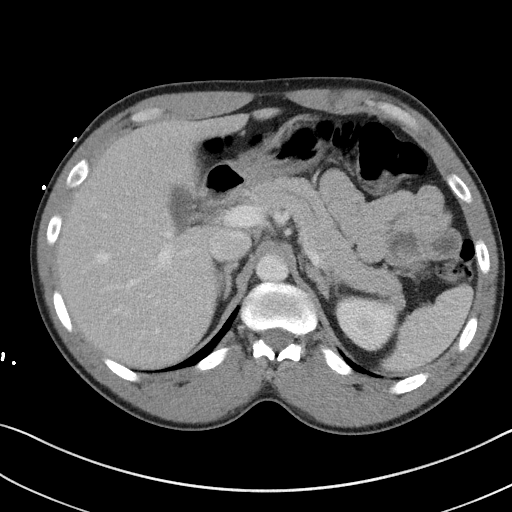
[im 72/93  bone]
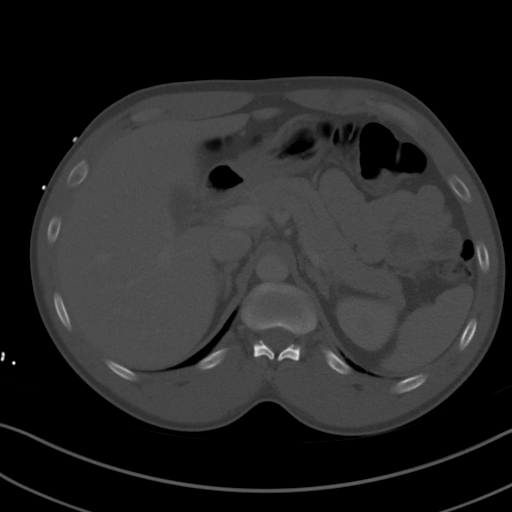
[im 77/93  soft-tissue]
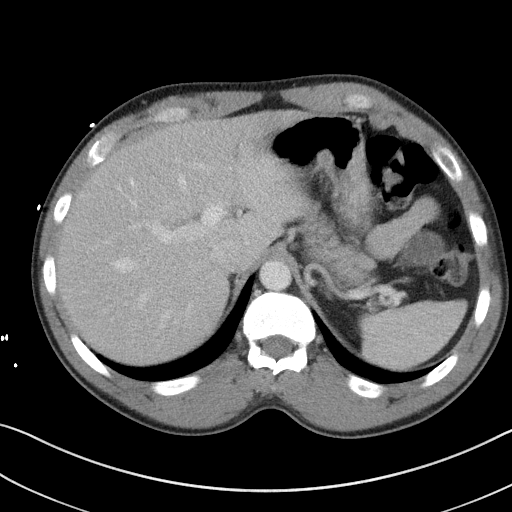
[im 87/93  soft-tissue]
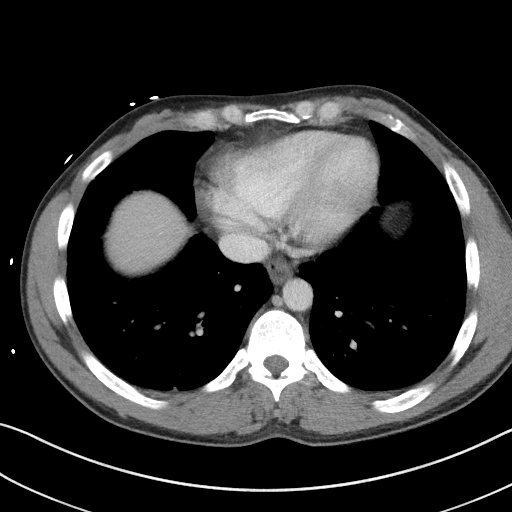

[Series 5: coronal st · coronal · 0.65mm/px · 3 of 87 slices shown]
[im 29/87  soft-tissue]
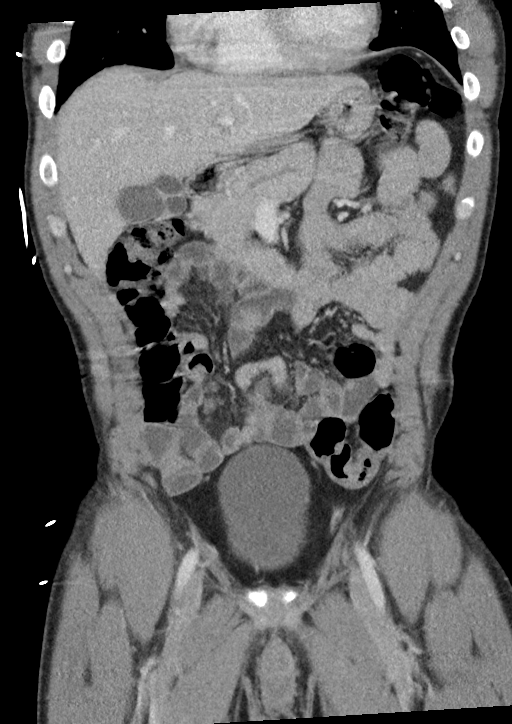
[im 39/87  soft-tissue]
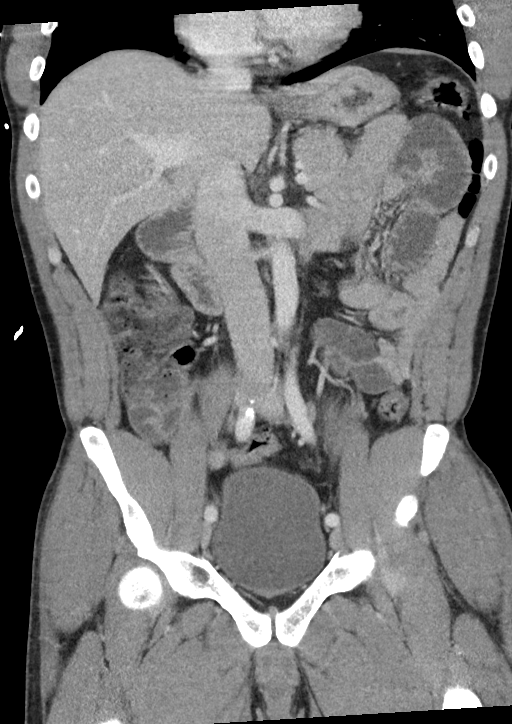
[im 48/87  soft-tissue]
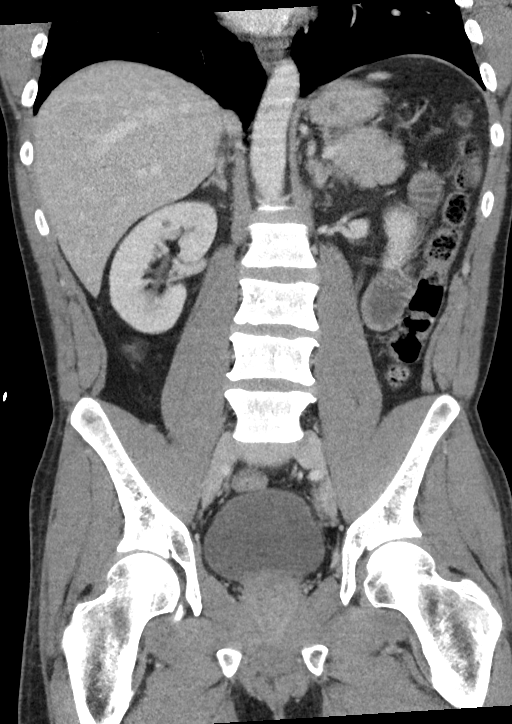

[14 of 46 positions shown; findings below may reference images not displayed]

FINDINGS: Lower chest: Lung bases demonstrate no acute consolidation or
pleural effusion. Borderline cardiomegaly.

Hepatobiliary: No focal liver abnormality is seen. No gallstones,
gallbladder wall thickening, or biliary dilatation.

Pancreas: Unremarkable. No pancreatic ductal dilatation or
surrounding inflammatory changes.

Spleen: Subcentimeter hypodensity too small to further characterize.

Adrenals/Urinary Tract: Adrenal glands are normal. Subcentimeter
hypodense lesions in both kidneys too small to further characterize.
No hydronephrosis. The bladder is normal

Stomach/Bowel: Stomach is within normal limits. Appendix appears
normal. No evidence of bowel wall thickening, distention, or
inflammatory changes.

Vascular/Lymphatic: No significant vascular findings are present. No
enlarged abdominal or pelvic lymph nodes.

Reproductive: Prostate is slightly enlarged

Other: Negative for free air or free fluid. Fat within the bilateral
inguinal canal

Musculoskeletal: No acute or significant osseous findings.
IMPRESSION: No CT evidence for acute intra-abdominal or pelvic abnormality.

ADDENDUM:
Additional clinical information is given. Patient has developed
umbilical region point tenderness with redness. The patient's CT
images are reviewed with specific attention to the umbilical region.
There is a small fat containing umbilical hernia without suspicious
features for incarceration. There is no bowel containing hernia.
There is minimal skin thickening. There are no obvious fluid
collections. Repeat CT could be obtained if new symptoms suggest
umbilical infectious process or potential hernia incarceration.

*** End of Addendum ***
FINDINGS: Lower chest: Lung bases demonstrate no acute consolidation or
pleural effusion. Borderline cardiomegaly.

Hepatobiliary: No focal liver abnormality is seen. No gallstones,
gallbladder wall thickening, or biliary dilatation.

Pancreas: Unremarkable. No pancreatic ductal dilatation or
surrounding inflammatory changes.

Spleen: Subcentimeter hypodensity too small to further characterize.

Adrenals/Urinary Tract: Adrenal glands are normal. Subcentimeter
hypodense lesions in both kidneys too small to further characterize.
No hydronephrosis. The bladder is normal

Stomach/Bowel: Stomach is within normal limits. Appendix appears
normal. No evidence of bowel wall thickening, distention, or
inflammatory changes.

Vascular/Lymphatic: No significant vascular findings are present. No
enlarged abdominal or pelvic lymph nodes.

Reproductive: Prostate is slightly enlarged

Other: Negative for free air or free fluid. Fat within the bilateral
inguinal canal

Musculoskeletal: No acute or significant osseous findings.
IMPRESSION: No CT evidence for acute intra-abdominal or pelvic abnormality.

## 2021-12-23 IMAGING — DX DG CHEST 1V PORT
1 series · 1 of 1 positions shown · non-contrast
Comparison: None.

CLINICAL DATA: Sepsis.  Abdominal pain.  Cough.  Smoker.

EXAM:
PORTABLE CHEST 1 VIEW

[chest ap]
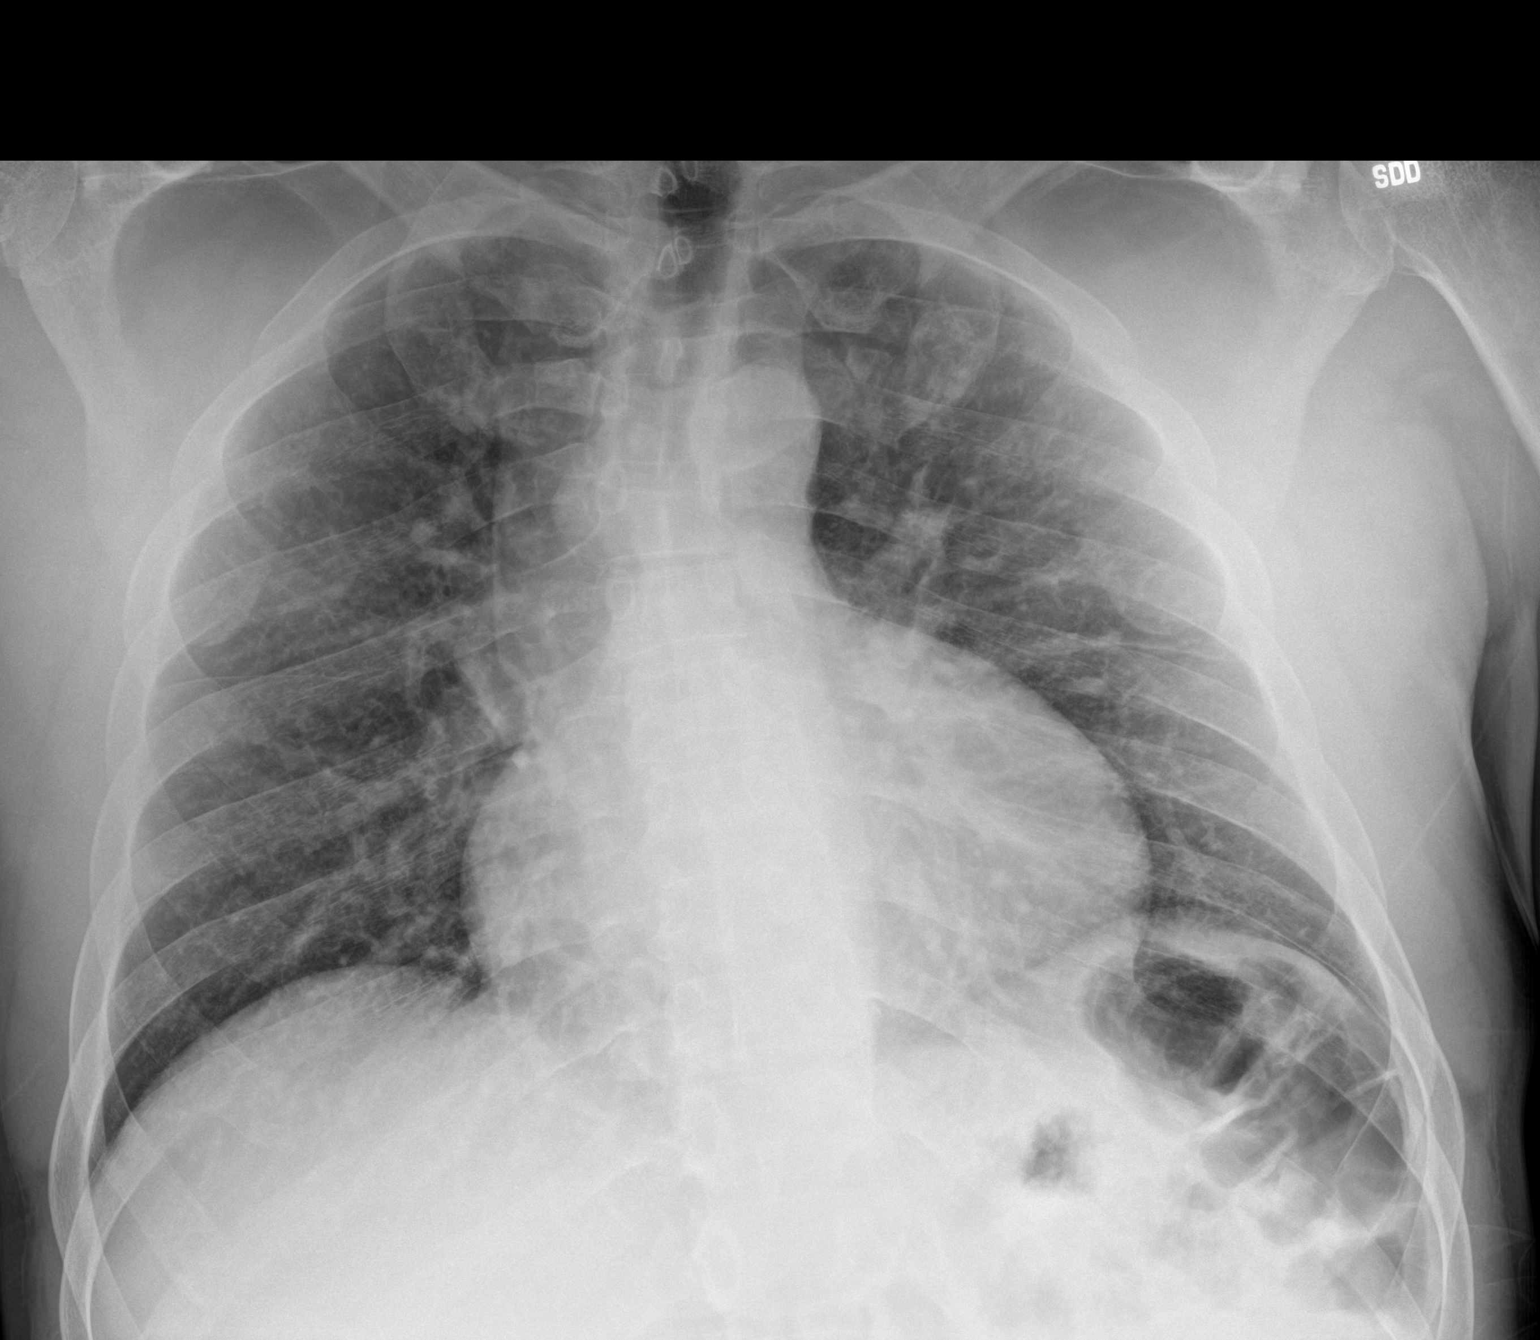

[1 of 1 positions shown; findings below may reference images not displayed]

FINDINGS: Shallow inspiration. Mild cardiac enlargement with some pulmonary
vascular congestion. No focal consolidation or edema. No pleural
effusions. No pneumothorax. Mediastinal contours appear intact.
IMPRESSION: Cardiac enlargement with mild pulmonary vascular congestion. No
edema or consolidation.

## 2021-12-25 IMAGING — CT CT ABD-PELV W/ CM
2 of 5 series · 14 of 46 positions shown, 16 images · IV contrast (APPLIED)
Comparison: 03/29/2020
COMPARISON: 03/29/2020

Addendum:
CLINICAL DATA: Increasing abdominal pain today. Suspected abdominal
abscess or infection

EXAM:
CT ABDOMEN AND PELVIS WITH CONTRAST
TECHNIQUE: Multidetector CT imaging of the abdomen and pelvis was performed
using the standard protocol following bolus administration of
intravenous contrast.
CONTRAST:  100mL OMNIPAQUE IOHEXOL 300 MG/ML  SOLN

[Series 2: routine abd/pel with · axial · 0.68mm/px · z∈[-318,+102]mm · 11 of 96 slices shown, 13 images]
[im 6/96  soft-tissue]
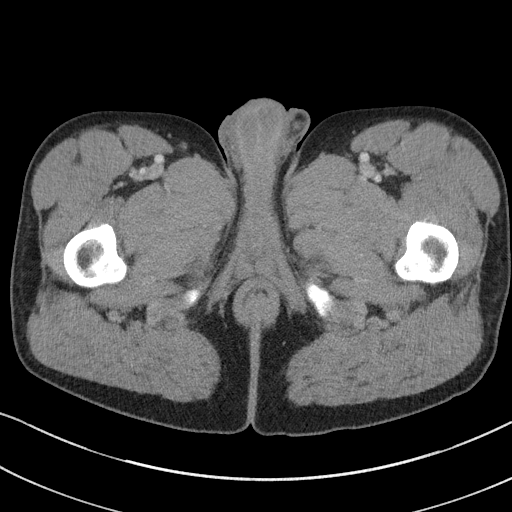
[im 6/96  bone]
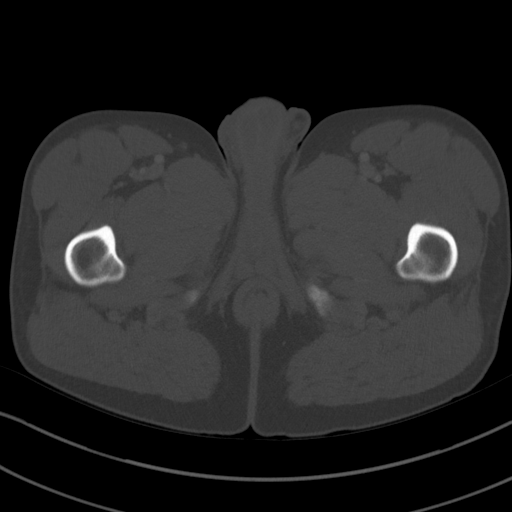
[im 16/96  soft-tissue]
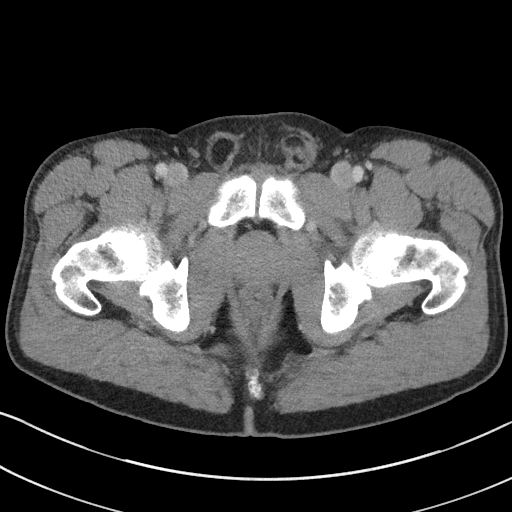
[im 22/96  soft-tissue]
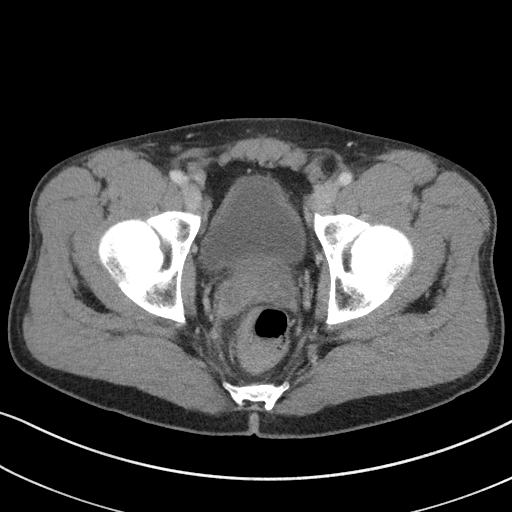
[im 32/96  soft-tissue]
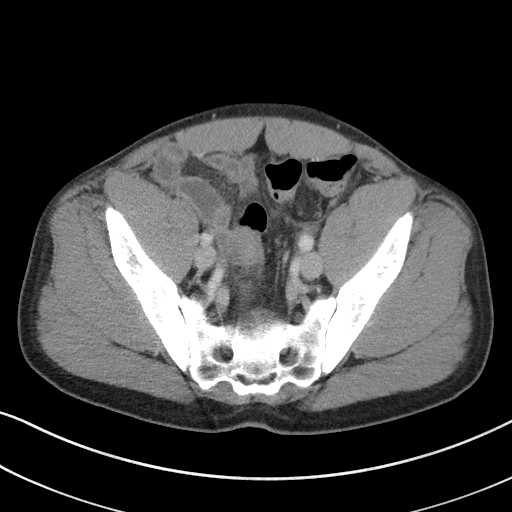
[im 37/96  soft-tissue]
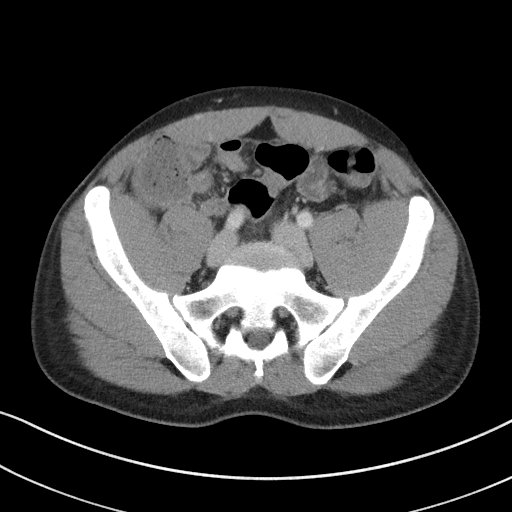
[im 48/96  soft-tissue]
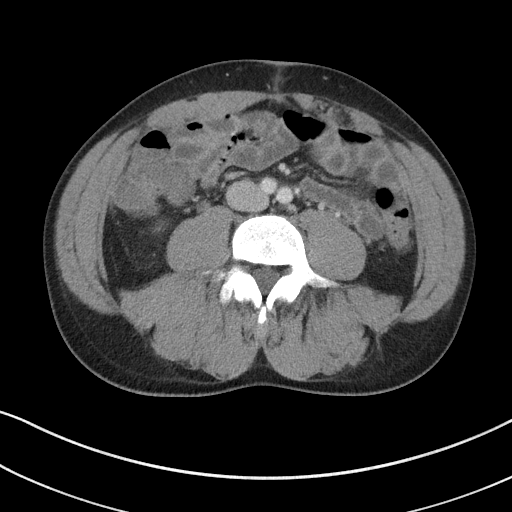
[im 59/96  soft-tissue]
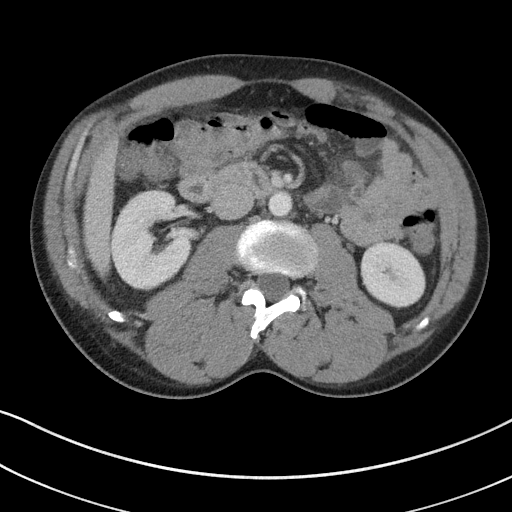
[im 64/96  soft-tissue]
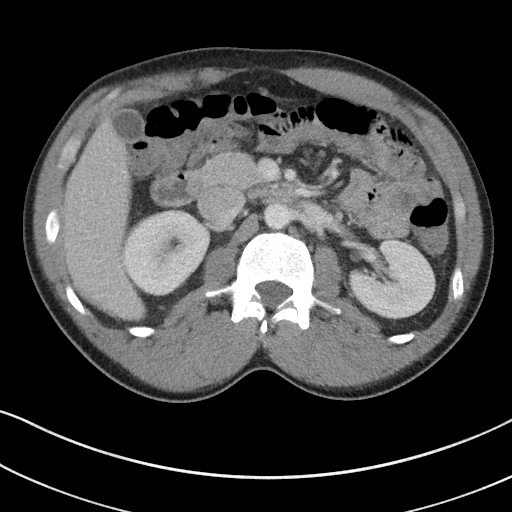
[im 74/96  soft-tissue]
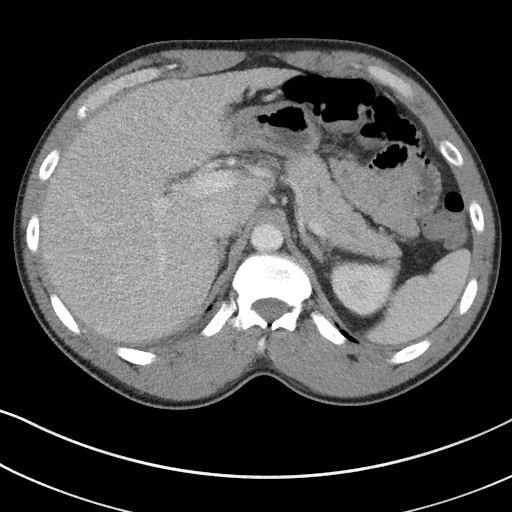
[im 74/96  bone]
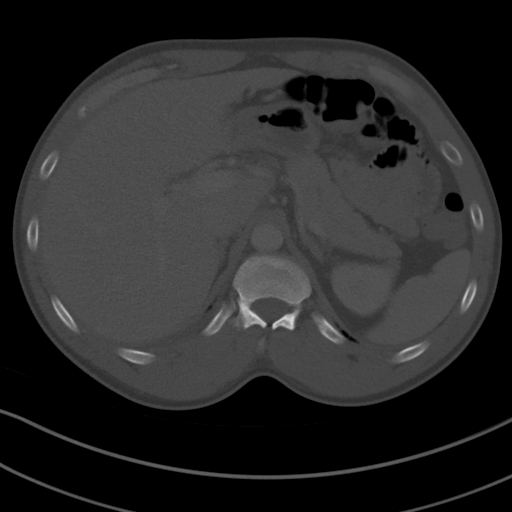
[im 80/96  soft-tissue]
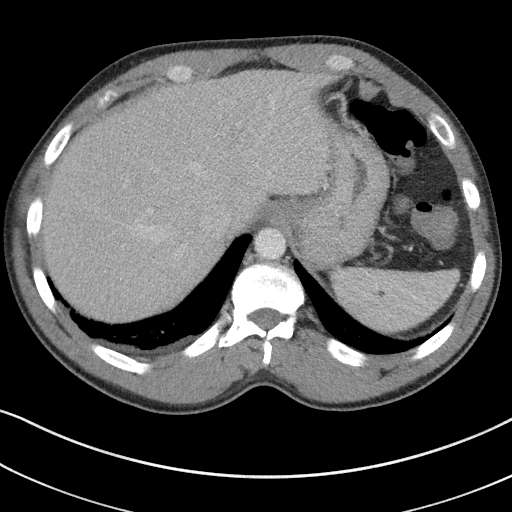
[im 90/96  soft-tissue]
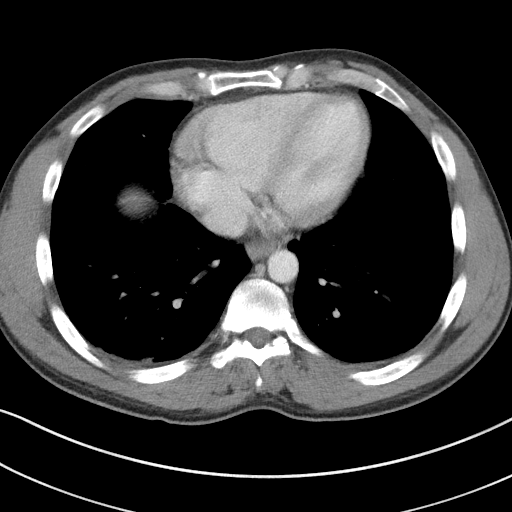

[Series 5: coronal st · coronal · 0.68mm/px · 3 of 83 slices shown]
[im 28/83  soft-tissue]
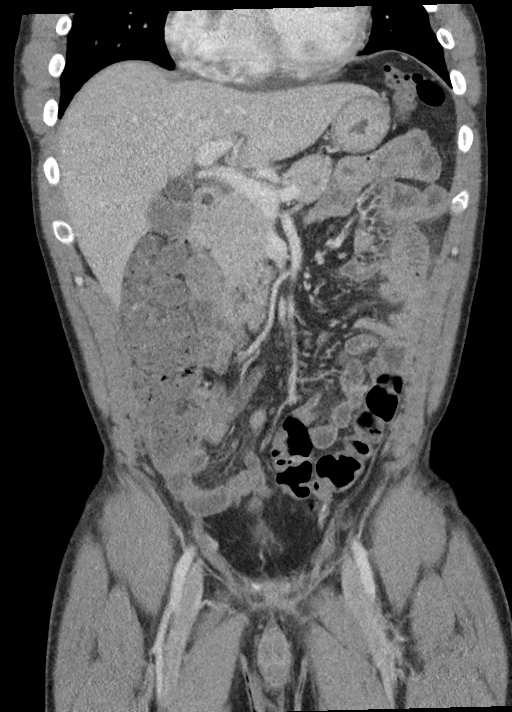
[im 37/83  soft-tissue]
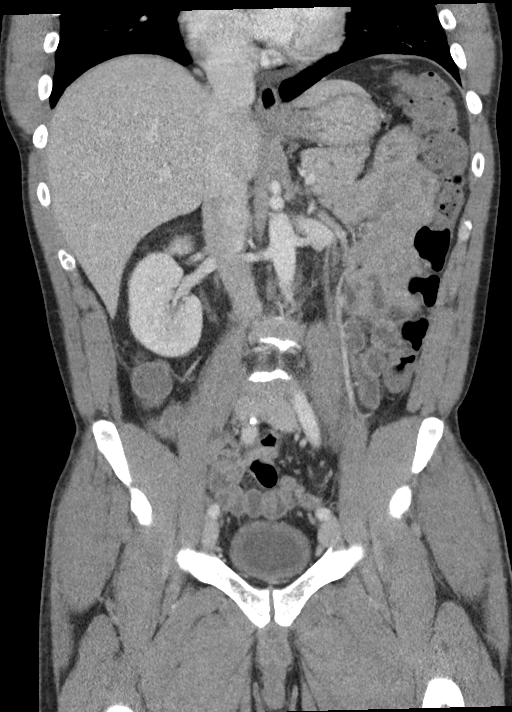
[im 46/83  soft-tissue]
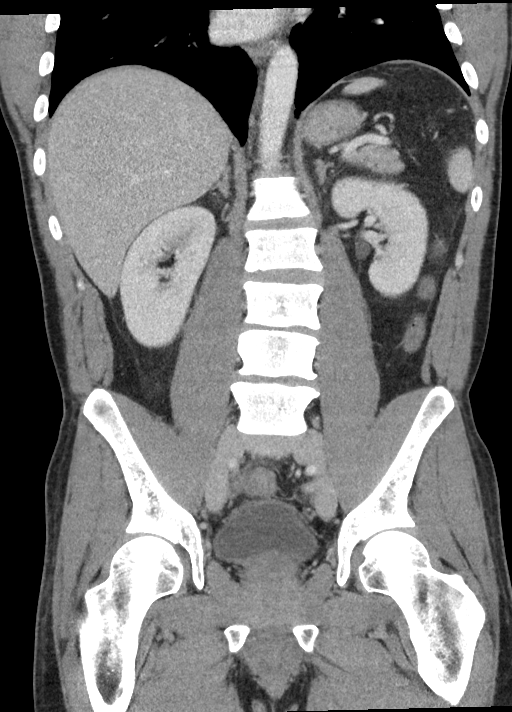

[14 of 46 positions shown; findings below may reference images not displayed]

FINDINGS: Lower chest: Mild dependent atelectasis in the lung bases.

Hepatobiliary: No focal liver abnormality is seen. No gallstones,
gallbladder wall thickening, or biliary dilatation.

Pancreas: Mild pancreatic ductal dilatation. Homogeneous normal
appearance of pancreatic parenchyma. No edema or inflammatory
changes. Ductal dilatation may indicate early change of acute
pancreatitis. Given the patient's history of increasing pain,
consider laboratory investigation for pancreatitis.

Spleen: Normal in size without focal abnormality.

Adrenals/Urinary Tract: Adrenal glands are unremarkable. Kidneys are
normal, without renal calculi, focal lesion, or hydronephrosis.
Bladder is unremarkable.

Stomach/Bowel: Stomach is within normal limits. Appendix appears
normal. No evidence of bowel wall thickening, distention, or
inflammatory changes.

Vascular/Lymphatic: No significant vascular findings are present. No
enlarged abdominal or pelvic lymph nodes.

Reproductive: Prostate is unremarkable.

Other: Small periumbilical hernia containing fat. No bowel
herniation. No abdominopelvic ascites.

Musculoskeletal: No acute or significant osseous findings.
IMPRESSION: 1. Mild pancreatic ductal dilatation. No edema or inflammatory
changes. Given the patient's history of increasing pain, consider
laboratory investigation for pancreatitis.
2. No evidence of bowel obstruction or inflammation.
3. Small periumbilical hernia containing fat. No bowel herniation.

ADDENDUM:
Images were reviewed in consultation with the referring provider
with specific attention to the periumbilical region. As indicated,
there is a small periumbilical hernia containing fat without bowel
herniation. Mild infiltration in the herniated fat is nonspecific
but could indicate fat necrosis in the appropriate clinical setting.
No fluid collection to suggest an abscess or hematoma.

*** End of Addendum ***
FINDINGS: Lower chest: Mild dependent atelectasis in the lung bases.

Hepatobiliary: No focal liver abnormality is seen. No gallstones,
gallbladder wall thickening, or biliary dilatation.

Pancreas: Mild pancreatic ductal dilatation. Homogeneous normal
appearance of pancreatic parenchyma. No edema or inflammatory
changes. Ductal dilatation may indicate early change of acute
pancreatitis. Given the patient's history of increasing pain,
consider laboratory investigation for pancreatitis.

Spleen: Normal in size without focal abnormality.

Adrenals/Urinary Tract: Adrenal glands are unremarkable. Kidneys are
normal, without renal calculi, focal lesion, or hydronephrosis.
Bladder is unremarkable.

Stomach/Bowel: Stomach is within normal limits. Appendix appears
normal. No evidence of bowel wall thickening, distention, or
inflammatory changes.

Vascular/Lymphatic: No significant vascular findings are present. No
enlarged abdominal or pelvic lymph nodes.

Reproductive: Prostate is unremarkable.

Other: Small periumbilical hernia containing fat. No bowel
herniation. No abdominopelvic ascites.

Musculoskeletal: No acute or significant osseous findings.
IMPRESSION: 1. Mild pancreatic ductal dilatation. No edema or inflammatory
changes. Given the patient's history of increasing pain, consider
laboratory investigation for pancreatitis.
2. No evidence of bowel obstruction or inflammation.
3. Small periumbilical hernia containing fat. No bowel herniation.

## 2022-01-03 ENCOUNTER — Inpatient Hospital Stay: Payer: No Typology Code available for payment source

## 2022-01-03 ENCOUNTER — Inpatient Hospital Stay: Payer: No Typology Code available for payment source | Attending: Internal Medicine

## 2022-01-03 DIAGNOSIS — C9201 Acute myeloblastic leukemia, in remission: Secondary | ICD-10-CM | POA: Insufficient documentation

## 2022-01-03 LAB — CBC WITH DIFFERENTIAL/PLATELET
Abs Immature Granulocytes: 0.01 10*3/uL (ref 0.00–0.07)
Basophils Absolute: 0.1 10*3/uL (ref 0.0–0.1)
Basophils Relative: 3 %
Eosinophils Absolute: 0.1 10*3/uL (ref 0.0–0.5)
Eosinophils Relative: 4 %
HCT: 43.3 % (ref 39.0–52.0)
Hemoglobin: 14.5 g/dL (ref 13.0–17.0)
Immature Granulocytes: 0 %
Lymphocytes Relative: 34 %
Lymphs Abs: 0.9 10*3/uL (ref 0.7–4.0)
MCH: 30.5 pg (ref 26.0–34.0)
MCHC: 33.5 g/dL (ref 30.0–36.0)
MCV: 91.2 fL (ref 80.0–100.0)
Monocytes Absolute: 0.1 10*3/uL (ref 0.1–1.0)
Monocytes Relative: 4 %
Neutro Abs: 1.4 10*3/uL — ABNORMAL LOW (ref 1.7–7.7)
Neutrophils Relative %: 55 %
Platelets: 163 10*3/uL (ref 150–400)
RBC: 4.75 MIL/uL (ref 4.22–5.81)
RDW: 13.6 % (ref 11.5–15.5)
WBC: 2.5 10*3/uL — ABNORMAL LOW (ref 4.0–10.5)
nRBC: 0 % (ref 0.0–0.2)

## 2022-01-03 LAB — COMPREHENSIVE METABOLIC PANEL
ALT: 52 U/L — ABNORMAL HIGH (ref 0–44)
AST: 27 U/L (ref 15–41)
Albumin: 4.2 g/dL (ref 3.5–5.0)
Alkaline Phosphatase: 74 U/L (ref 38–126)
Anion gap: 7 (ref 5–15)
BUN: 13 mg/dL (ref 6–20)
CO2: 26 mmol/L (ref 22–32)
Calcium: 8.7 mg/dL — ABNORMAL LOW (ref 8.9–10.3)
Chloride: 101 mmol/L (ref 98–111)
Creatinine, Ser: 1.2 mg/dL (ref 0.61–1.24)
GFR, Estimated: >60 mL/min (ref >60)
Glucose, Bld: 146 mg/dL — ABNORMAL HIGH (ref 70–99)
Potassium: 4 mmol/L (ref 3.5–5.1)
Sodium: 134 mmol/L — ABNORMAL LOW (ref 135–145)
Total Bilirubin: 1.1 mg/dL (ref 0.3–1.2)
Total Protein: 7.1 g/dL (ref 6.5–8.1)

## 2022-01-05 ENCOUNTER — Other Ambulatory Visit: Payer: Self-pay | Admitting: Urology

## 2022-01-25 ENCOUNTER — Encounter: Payer: Self-pay | Admitting: Hematology and Oncology

## 2022-01-25 NOTE — Telephone Encounter (Signed)
Signing encounter, see note on 09/28/20 

## 2022-02-05 ENCOUNTER — Inpatient Hospital Stay (HOSPITAL_BASED_OUTPATIENT_CLINIC_OR_DEPARTMENT_OTHER): Payer: No Typology Code available for payment source | Admitting: Oncology

## 2022-02-05 ENCOUNTER — Other Ambulatory Visit: Payer: Self-pay

## 2022-02-05 ENCOUNTER — Inpatient Hospital Stay: Payer: No Typology Code available for payment source | Attending: Oncology

## 2022-02-05 ENCOUNTER — Inpatient Hospital Stay: Payer: No Typology Code available for payment source

## 2022-02-05 ENCOUNTER — Encounter: Payer: Self-pay | Admitting: Oncology

## 2022-02-05 VITALS — BP 113/74 | HR 98 | Temp 98.7°F | Resp 19 | Wt 215.3 lb

## 2022-02-05 DIAGNOSIS — C9201 Acute myeloblastic leukemia, in remission: Secondary | ICD-10-CM

## 2022-02-05 DIAGNOSIS — Z79899 Other long term (current) drug therapy: Secondary | ICD-10-CM | POA: Insufficient documentation

## 2022-02-05 LAB — COMPREHENSIVE METABOLIC PANEL
ALT: 49 U/L — ABNORMAL HIGH (ref 0–44)
AST: 28 U/L (ref 15–41)
Albumin: 4.1 g/dL (ref 3.5–5.0)
Alkaline Phosphatase: 84 U/L (ref 38–126)
Anion gap: 5 (ref 5–15)
BUN: 13 mg/dL (ref 6–20)
CO2: 24 mmol/L (ref 22–32)
Calcium: 8.7 mg/dL — ABNORMAL LOW (ref 8.9–10.3)
Chloride: 107 mmol/L (ref 98–111)
Creatinine, Ser: 1.14 mg/dL (ref 0.61–1.24)
GFR, Estimated: >60 mL/min (ref >60)
Glucose, Bld: 106 mg/dL — ABNORMAL HIGH (ref 70–99)
Potassium: 4.1 mmol/L (ref 3.5–5.1)
Sodium: 136 mmol/L (ref 135–145)
Total Bilirubin: 1.2 mg/dL (ref 0.3–1.2)
Total Protein: 7.5 g/dL (ref 6.5–8.1)

## 2022-02-05 LAB — CBC WITH DIFFERENTIAL/PLATELET
Abs Immature Granulocytes: 0.01 10*3/uL (ref 0.00–0.07)
Basophils Absolute: 0.1 10*3/uL (ref 0.0–0.1)
Basophils Relative: 2 %
Eosinophils Absolute: 0.1 10*3/uL (ref 0.0–0.5)
Eosinophils Relative: 2 %
HCT: 42.5 % (ref 39.0–52.0)
Hemoglobin: 14.3 g/dL (ref 13.0–17.0)
Immature Granulocytes: 0 %
Lymphocytes Relative: 31 %
Lymphs Abs: 1 10*3/uL (ref 0.7–4.0)
MCH: 30.8 pg (ref 26.0–34.0)
MCHC: 33.6 g/dL (ref 30.0–36.0)
MCV: 91.4 fL (ref 80.0–100.0)
Monocytes Absolute: 0.3 10*3/uL (ref 0.1–1.0)
Monocytes Relative: 11 %
Neutro Abs: 1.7 10*3/uL (ref 1.7–7.7)
Neutrophils Relative %: 54 %
Platelets: 189 10*3/uL (ref 150–400)
RBC: 4.65 MIL/uL (ref 4.22–5.81)
RDW: 14 % (ref 11.5–15.5)
WBC: 3.2 10*3/uL — ABNORMAL LOW (ref 4.0–10.5)
nRBC: 0 % (ref 0.0–0.2)

## 2022-02-10 ENCOUNTER — Encounter: Payer: Self-pay | Admitting: Hematology and Oncology

## 2022-02-10 NOTE — Progress Notes (Signed)
   Hematology/Oncology Consult note [facility]  [phone] [phone]  Patient Care Team: Pcp, No as PCP - General Rao, Archana C, MD as Consulting Physician (Oncology)   Name of the patient: Carl [name]  [id number]  11/07/1975   Date of visit: 02/10/22  Diagnosis- history of AML in remission    Chief complaint/ Reason for visit-routine follow of AML on oral Vidaza  Heme/Onc history: Carl Yagi Jr. 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 Gene Coding Predicted Protein Variant allele fraction  U2AF1 c.101C>T p.(Ser34Phe) 12.9 %  RUNX1 c.620_621insATCCCCCG p.(Gln208Serfs*6) 6.7 %  NRAS c.38G>A p.(Gly13Asp) 9.2 %   Variants of Unknown Clinical Significance:  Gene Coding Predicted Protein Variant allele fraction  TET2 c.4493G>A p.(Arg1498His) 48.2 %  ETV6 c.776G>A p.(Arg259Gln) 49.1 %   Pertinent Phenotypic data: blasts express CD7, CD13, CD34, CD38, CD71, CD117, and HLA-DR.   He received cytarabine and daunorubicin (7+3) beginning 04/13/2020 and 05/27/2020.  Course has been complicated by fever and neutropenia with mucositis (04/27/2020) and gluteal cleft cellulitis (05/06/2020).Received induction with 7+3+HD with delayed count recovery. D42 BMBx revealed persistent disease and patient re-induced with 7+3.  He then went on to receive consolidation therapy with HiDAC and cycle 4 received on 11/04/2020.  He follows up with Dr. Foster at [facility]. Last bone marrow biopsy on 12/05/2020 which showed hypercellular bone marrow with trilineage hematopoiesis and 3% blasts by manual differential.   He is presently on azacitidine p.o. 300 mg daily 2 weeks on 2 weeks off.    Interval history-patient reports doing well overall.  Denies any nausea vomiting or diarrhea.  Denies any recurrent infections.  He recently went to [place] for a week and enjoyed his time there.  ECOG PS- 1 Pain scale- 0   Review of  systems- Review of Systems  Constitutional:  Negative for chills, fever, malaise/fatigue and weight loss.  HENT:  Negative for congestion, ear discharge and nosebleeds.   Eyes:  Negative for blurred vision.  Respiratory:  Negative for cough, hemoptysis, sputum production, shortness of breath and wheezing.   Cardiovascular:  Negative for chest pain, palpitations, orthopnea and claudication.  Gastrointestinal:  Negative for abdominal pain, blood in stool, constipation, diarrhea, heartburn, melena, nausea and vomiting.  Genitourinary:  Negative for dysuria, flank pain, frequency, hematuria and urgency.  Musculoskeletal:  Negative for back pain, joint pain and myalgias.  Skin:  Negative for rash.  Neurological:  Negative for dizziness, tingling, focal weakness, seizures, weakness and headaches.  Endo/Heme/Allergies:  Does not bruise/bleed easily.  Psychiatric/Behavioral:  Negative for depression and suicidal ideas. The patient does not have insomnia.       Allergies  Allergen Reactions   Dapsone     Contraindication - G6PD deficient   Primaquine     Contraindication - G6PD deficient   Rasburicase     Contraindication - G6PD deficient     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[facility])    Agranulocytosis secondary to cancer chemotherapy (CODE) ([facility])    Chemotherapy induced neutropenia ([facility])    Pancreatitis    Pancytopenia ([facility])    Thrombocytopenia ([facility])    Tobacco use disorder      History reviewed. No pertinent surgical history.  Social History   Socioeconomic History   Marital status: Single    Spouse name: Not on file   Number of children: Not on file   Years of education: Not on   file   Highest education level: Not on file  Occupational History   Not on file  Tobacco Use   Smoking status: Former    Packs/day: 1.00    Years: 17.00    Total pack years: 17.00    Types: Cigarettes    Quit date: 03/27/2020    Years since quitting: 1.8    Smokeless tobacco: Never  Vaping Use   Vaping Use: Never used  Substance and Sexual Activity   Alcohol use: Not Currently    Comment: 2 time a month wine   Drug use: Never    Types: Marijuana   Sexual activity: Yes  Other Topics Concern   Not on file  Social History Narrative   Not on file   Social Determinants of Health   Financial Resource Strain: Not on file  Food Insecurity: Not on file  Transportation Needs: No Transportation Needs (02/05/2022)   PRAPARE - Transportation    Lack of Transportation (Medical): No    Lack of Transportation (Non-Medical): No  Physical Activity: Not on file  Stress: Not on file  Social Connections: Not on file  Intimate Partner Violence: Not on file    Family History  Problem Relation Age of Onset   Prostate cancer Neg Hx    Kidney cancer Neg Hx    Bladder Cancer Neg Hx      Current Outpatient Medications:    azaCITIDine 300 MG TABS, Take by mouth., Disp: , Rfl:    ondansetron (ZOFRAN) 8 MG tablet, Take 1 tablet 30 minutes before each dose of Onureg. Can take additional doses every 8 hours as needed if having nausea., Disp: , Rfl:    valACYclovir (VALTREX) 500 MG tablet, Take 500 mg by mouth daily., Disp: , Rfl:   Physical exam:  Vitals:   02/05/22 1427  BP: 113/74  Pulse: 98  Resp: 19  Temp: 98.7 F (37.1 C)  SpO2: 98%  Weight: 215 lb 4.8 oz (97.7 kg)   Physical Exam Constitutional:      General: He is not in acute distress. Cardiovascular:     Rate and Rhythm: Normal rate and regular rhythm.     Heart sounds: Normal heart sounds.  Pulmonary:     Effort: Pulmonary effort is normal.     Breath sounds: Normal breath sounds.  Abdominal:     General: Bowel sounds are normal.     Palpations: Abdomen is soft.  Skin:    General: Skin is warm and dry.  Neurological:     Mental Status: He is alert and oriented to person, place, and time.         Latest Ref Rng & Units 02/05/2022    2:11 PM  CMP  Glucose 70 - 99 mg/dL  106   BUN 6 - 20 mg/dL 13   Creatinine 0.61 - 1.24 mg/dL 1.14   Sodium 135 - 145 mmol/L 136   Potassium 3.5 - 5.1 mmol/L 4.1   Chloride 98 - 111 mmol/L 107   CO2 22 - 32 mmol/L 24   Calcium 8.9 - 10.3 mg/dL 8.7   Total Protein 6.5 - 8.1 g/dL 7.5   Total Bilirubin 0.3 - 1.2 mg/dL 1.2   Alkaline Phos 38 - 126 U/L 84   AST 15 - 41 U/L 28   ALT 0 - 44 U/L 49       Latest Ref Rng & Units 02/05/2022    2:11 PM  CBC  WBC 4.0 - 10.5 K/uL 3.2     Hemoglobin 13.0 - 17.0 g/dL 14.3   Hematocrit 39.0 - 52.0 % 42.5   Platelets 150 - 400 K/uL 189    Assessment and plan- Patient is a 47 y.o. male history of AML s/p induction and consolidation chemo currently on maintenance oral azacitidine 2 weeks on and 2 week off.  He is here for routine follow-up  Patient is tolerating oral Vidaza well without any significant side effects. He has some baseline leukopenia/neutropenia but his ANC typically is greater than 1.5.  Hemoglobin is normal and platelet counts are normal.  Mildly abnormal LFTs which are chronic which she will continue to monitor.  He will continue oral Vidaza 2 weeks on and 2 weeks off until progression or toxicity.  I will continue to check monthly counts on him and I will see him back in 3 months   Visit Diagnosis 1. High risk medication use   2. Acute myeloid leukemia in remission [facility])      Dr. Randa Evens, MD, MPH [facility] at [facility] [phone] 02/10/2022 8:50 AM

## 2022-02-20 ENCOUNTER — Other Ambulatory Visit: Payer: Self-pay | Admitting: Urology

## 2022-03-07 ENCOUNTER — Inpatient Hospital Stay: Payer: No Typology Code available for payment source

## 2022-03-07 ENCOUNTER — Inpatient Hospital Stay: Payer: No Typology Code available for payment source | Attending: Oncology

## 2022-03-07 DIAGNOSIS — C9201 Acute myeloblastic leukemia, in remission: Secondary | ICD-10-CM

## 2022-03-07 LAB — CBC WITH DIFFERENTIAL/PLATELET
Abs Immature Granulocytes: 0.01 10*3/uL (ref 0.00–0.07)
Basophils Absolute: 0 10*3/uL (ref 0.0–0.1)
Basophils Relative: 1 %
Eosinophils Absolute: 0.1 10*3/uL (ref 0.0–0.5)
Eosinophils Relative: 2 %
HCT: 44.9 % (ref 39.0–52.0)
Hemoglobin: 15.1 g/dL (ref 13.0–17.0)
Immature Granulocytes: 0 %
Lymphocytes Relative: 28 %
Lymphs Abs: 1 10*3/uL (ref 0.7–4.0)
MCH: 30.4 pg (ref 26.0–34.0)
MCHC: 33.6 g/dL (ref 30.0–36.0)
MCV: 90.3 fL (ref 80.0–100.0)
Monocytes Absolute: 0.3 10*3/uL (ref 0.1–1.0)
Monocytes Relative: 9 %
Neutro Abs: 2.1 10*3/uL (ref 1.7–7.7)
Neutrophils Relative %: 60 %
Platelets: 149 10*3/uL — ABNORMAL LOW (ref 150–400)
RBC: 4.97 MIL/uL (ref 4.22–5.81)
RDW: 13.2 % (ref 11.5–15.5)
WBC: 3.4 10*3/uL — ABNORMAL LOW (ref 4.0–10.5)
nRBC: 0 % (ref 0.0–0.2)

## 2022-03-07 LAB — COMPREHENSIVE METABOLIC PANEL
ALT: 87 U/L — ABNORMAL HIGH (ref 0–44)
AST: 36 U/L (ref 15–41)
Albumin: 4.1 g/dL (ref 3.5–5.0)
Alkaline Phosphatase: 89 U/L (ref 38–126)
Anion gap: 7 (ref 5–15)
BUN: 12 mg/dL (ref 6–20)
CO2: 23 mmol/L (ref 22–32)
Calcium: 8.7 mg/dL — ABNORMAL LOW (ref 8.9–10.3)
Chloride: 103 mmol/L (ref 98–111)
Creatinine, Ser: 1.05 mg/dL (ref 0.61–1.24)
GFR, Estimated: >60 mL/min (ref >60)
Glucose, Bld: 158 mg/dL — ABNORMAL HIGH (ref 70–99)
Potassium: 3.8 mmol/L (ref 3.5–5.1)
Sodium: 133 mmol/L — ABNORMAL LOW (ref 135–145)
Total Bilirubin: 0.7 mg/dL (ref 0.3–1.2)
Total Protein: 7.2 g/dL (ref 6.5–8.1)

## 2022-03-14 ENCOUNTER — Telehealth: Payer: Self-pay | Admitting: *Deleted

## 2022-03-14 NOTE — Telephone Encounter (Signed)
called the pt. He said that his next appt is in sept. And he usually gets a lab once a month and then he gets med azacitidine. I told the pt that he usually gets the oral med azacitidine from [facility]. He knows that and he wants next lab appt. I told him I will speak to Janese Banks and get back with him. When I spoke to Janese Banks she states that Dr. Janene Madeira says that he is doing so good on 300 mg for 2 weeks and off 2 weeks that Dr. Janese Banks could take over with his care. I called pt back and told him this and he is good with that. He started the Azacitidine on 7/15. So we will make a new lab appt either July 26 or 27. I spoke to [employer] the oral pharmacist and she states that when we order his next rx it will to to [facility] and they will mail it to him. He is agreeable and I will also check if pt. Needs to see pt. Next labd date or still leave it for Sept. I will let him know as soon as I get the answer from [place]. Pt ok with plan

## 2022-03-22 ENCOUNTER — Other Ambulatory Visit: Payer: Self-pay | Admitting: *Deleted

## 2022-03-22 ENCOUNTER — Telehealth: Payer: Self-pay | Admitting: *Deleted

## 2022-03-22 ENCOUNTER — Other Ambulatory Visit (HOSPITAL_COMMUNITY): Payer: Self-pay

## 2022-03-22 ENCOUNTER — Encounter: Payer: Self-pay | Admitting: Hematology and Oncology

## 2022-03-22 DIAGNOSIS — C9201 Acute myeloblastic leukemia, in remission: Secondary | ICD-10-CM

## 2022-03-22 MED ORDER — AZACITIDINE 300 MG PO TABS
300.0000 mg | ORAL_TABLET | Freq: Every day | ORAL | 6 refills | Status: DC
Start: 2022-04-08 — End: 2022-03-22
  Filled 2022-03-22: qty 14, fill #0

## 2022-03-22 MED ORDER — AZACITIDINE 300 MG PO TABS: 300.0000 mg | ORAL_TABLET | Freq: Every day | ORAL | 6 refills | Status: DC

## 2022-03-22 NOTE — Addendum Note (Signed)
Addended by: Darl Pikes on: 03/22/2022 02:39 PM   Modules accepted: Orders

## 2022-03-22 NOTE — Telephone Encounter (Signed)
I called the patient to let him know that we have scheduled him a lab for August 10 and we have set up his transportation for that.  I have put in a prescription for him to get his azacitidine 100 mg he takes 1 a day for 2 weeks and then off for 2 weeks and repeat the cycle.  Also we wanted to make an appointment for him to come and see Dr. Janese Banks since we are taking over his care from [facility] altogether.  The original appointment we had in the computer was 9 /18  however wanted to make an appointment for him that would be his lab day for the next cycle and that way he could see Dr. Janese Banks get his labs and we would not have to get him come in another day.  We set up transportation for September 8 and patient is aware right now his MyChart is not working so we tried to fix it for him today and he is going to let us know if he needs more help but in the meantime I went ahead and put his schedules on paper and put it in the mail.  Patient is aware of this and agrees with above.

## 2022-03-28 ENCOUNTER — Telehealth: Payer: Self-pay

## 2022-03-28 NOTE — Telephone Encounter (Signed)
Called pt to inform that his return to work paperwork was completely filled out and ready to be mailed. However, pt needs to sign first two pages. Pt states he is unable to get to facility today due to transportation issues, but he will try to get here tomorrow. Informed pt I will reach out to [employer] (Ms. Jeanette Caprice) to ask if we are able to email or fax over paperwork so we do not miss the 8/9 deadline. Papers are to be mailed but afraid deadline will be missed. Provided Ms. Akers with a call back phone number for her to return our call.

## 2022-03-29 ENCOUNTER — Telehealth: Payer: Self-pay

## 2022-03-29 NOTE — Telephone Encounter (Signed)
Contacted Ms. Akers from [employer] regarding pts return to work paperwork; for a quicker delivery Ms. Akers advised me to email paperwork. Made sure paperwork was receieved and they were. Contacted pt to make aware that paperwork was sent off and has been receieved.

## 2022-04-05 ENCOUNTER — Inpatient Hospital Stay: Payer: No Typology Code available for payment source

## 2022-04-05 ENCOUNTER — Other Ambulatory Visit: Payer: Self-pay | Admitting: Urology

## 2022-04-05 ENCOUNTER — Inpatient Hospital Stay: Payer: No Typology Code available for payment source | Attending: Oncology

## 2022-04-05 DIAGNOSIS — C9201 Acute myeloblastic leukemia, in remission: Secondary | ICD-10-CM

## 2022-04-05 LAB — COMPREHENSIVE METABOLIC PANEL
ALT: 85 U/L — ABNORMAL HIGH (ref 0–44)
AST: 37 U/L (ref 15–41)
Albumin: 4.2 g/dL (ref 3.5–5.0)
Alkaline Phosphatase: 109 U/L (ref 38–126)
Anion gap: 9 (ref 5–15)
BUN: 16 mg/dL (ref 6–20)
CO2: 26 mmol/L (ref 22–32)
Calcium: 9.3 mg/dL (ref 8.9–10.3)
Chloride: 98 mmol/L (ref 98–111)
Creatinine, Ser: 1.33 mg/dL — ABNORMAL HIGH (ref 0.61–1.24)
GFR, Estimated: >60 mL/min (ref >60)
Glucose, Bld: 336 mg/dL — ABNORMAL HIGH (ref 70–99)
Potassium: 4.3 mmol/L (ref 3.5–5.1)
Sodium: 133 mmol/L — ABNORMAL LOW (ref 135–145)
Total Bilirubin: 0.3 mg/dL (ref 0.3–1.2)
Total Protein: 7.6 g/dL (ref 6.5–8.1)

## 2022-04-05 LAB — CBC WITH DIFFERENTIAL/PLATELET
Abs Immature Granulocytes: 0.01 10*3/uL (ref 0.00–0.07)
Basophils Absolute: 0.1 10*3/uL (ref 0.0–0.1)
Basophils Relative: 1 %
Eosinophils Absolute: 0.1 10*3/uL (ref 0.0–0.5)
Eosinophils Relative: 3 %
HCT: 46.6 % (ref 39.0–52.0)
Hemoglobin: 15.8 g/dL (ref 13.0–17.0)
Immature Granulocytes: 0 %
Lymphocytes Relative: 28 %
Lymphs Abs: 1.1 10*3/uL (ref 0.7–4.0)
MCH: 30.2 pg (ref 26.0–34.0)
MCHC: 33.9 g/dL (ref 30.0–36.0)
MCV: 88.9 fL (ref 80.0–100.0)
Monocytes Absolute: 0.3 10*3/uL (ref 0.1–1.0)
Monocytes Relative: 9 %
Neutro Abs: 2.2 10*3/uL (ref 1.7–7.7)
Neutrophils Relative %: 59 %
Platelets: 152 10*3/uL (ref 150–400)
RBC: 5.24 MIL/uL (ref 4.22–5.81)
RDW: 12.5 % (ref 11.5–15.5)
WBC: 3.8 10*3/uL — ABNORMAL LOW (ref 4.0–10.5)
nRBC: 0 % (ref 0.0–0.2)

## 2022-04-06 ENCOUNTER — Telehealth: Payer: Self-pay

## 2022-04-06 NOTE — Telephone Encounter (Signed)
Pt is aware that he should start his azacitidine on Sunday 8/13 based on his labs. Pt understands and agrees to begin on Sunday.

## 2022-04-09 ENCOUNTER — Telehealth: Payer: Self-pay | Admitting: *Deleted

## 2022-04-09 NOTE — Telephone Encounter (Signed)
Patient called reporting that for several days, he has had a problem urinating, and has a rash" down low" When questioned further, he states that his penis is swollen and red and that it is sensitive and itches. His urine is dark yellow in color and he does not note a foul odor nor does he note any blood in it. He is trying to stay hydrated and states he drinks 2 bottles of water and juice and soda daily. He states it has gotten no better or worse over the past 2 days, bit would like something done for it as it is bothersome and not improving. Please advise

## 2022-04-09 NOTE — Telephone Encounter (Signed)
Discussed with NP. It is recommended that patient see urgent care for these symptoms. Patient informed and verbalized understanding.

## 2022-04-10 ENCOUNTER — Ambulatory Visit
Admission: EM | Admit: 2022-04-10 | Discharge: 2022-04-10 | Disposition: A | Discharge status: Home / Self Care | Payer: No Typology Code available for payment source | Attending: Emergency Medicine | Admitting: Emergency Medicine

## 2022-04-10 DIAGNOSIS — B3742 Candidal balanitis: Secondary | ICD-10-CM | POA: Diagnosis present

## 2022-04-10 LAB — HIV ANTIBODY (ROUTINE TESTING W REFLEX): HIV Screen 4th Generation wRfx: NONREACTIVE

## 2022-04-10 MED ORDER — FLUCONAZOLE 150 MG PO TABS: 150.0000 mg | ORAL_TABLET | ORAL | 0 refills | Status: AC

## 2022-04-10 MED ORDER — MICONAZOLE NITRATE 2 % EX CREA: 1.0000 | TOPICAL_CREAM | Freq: Two times a day (BID) | CUTANEOUS | 0 refills | Status: DC

## 2022-04-10 NOTE — ED Provider Notes (Signed)
[facility]    CSN: [id number] Arrival date & time: 04/10/22  1706      History   Chief Complaint Chief Complaint  Patient presents with   Rash    Penis     HPI Carl [name]. is a 46 y.o. male.   Patient presents with erythematous blistering painful rash to the penis with penile head swelling beginning within the last week.  Has not attempted treatment of symptoms.  Denies sexual activity, last occurrence 6 months ago, at that time 1 partner.  No known exposures.  Denies urinary changes, penile discharge.  History of leukemia.   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 No pertinent surgical history.     Home Medications    Prior to Admission medications   Medication Sig Start Date End Date Taking? Authorizing Provider  azaCITIDine 300 MG TABS Take 300 mg by mouth daily. Take for 14 days, hold for 14 days. Repeat every 28 days. 04/08/22  Yes [name], MD  ondansetron (ZOFRAN) 8 MG tablet Take 1 tablet 30 minutes before each dose of Onureg. Can  take additional doses every 8 hours as needed if having nausea. 12/27/20  Yes [provider]  valACYclovir (VALTREX) 500 MG tablet Take 500 mg by mouth daily. 06/24/20  Yes [provider]    Family History Family History  Problem Relation Age of Onset   Prostate cancer Neg Hx    Kidney cancer Neg Hx    Bladder Cancer Neg Hx     Social History Social History   Tobacco Use   Smoking status: Former    Packs/day: 1.00    Years: 17.00    Total pack years: 17.00    Types: Cigarettes    Quit date: 03/27/2020    Years since quitting: 2.0   Smokeless tobacco: Never  Vaping Use   Vaping Use: Never used  Substance Use Topics   Alcohol use: Not Currently    Comment: 2 time a month wine   Drug use: Yes    Types: Marijuana     Allergies   Dapsone, Primaquine, and Rasburicase   Review of Systems Review of Systems  Constitutional: Negative.   Respiratory: Negative.    Cardiovascular: Negative.   Genitourinary:  Positive for genital sores. Negative for decreased urine volume, difficulty urinating, dysuria, enuresis, flank pain, frequency, hematuria, penile discharge, penile pain, penile swelling, scrotal swelling, testicular pain and urgency.  Skin:  Positive for rash.     Physical Exam Triage Vital Signs ED Triage Vitals  Enc Vitals Group  BP 04/10/22 1724 (!) 139/94     Pulse Rate 04/10/22 1724 90     Resp --      Temp 04/10/22 1724 98.2 F (36.8 C)     Temp Source 04/10/22 1724 Oral     SpO2 04/10/22 1724 99 %     Weight 04/10/22 1722 205 lb (93 kg)     Height 04/10/22 1722 '5\' 9"'$  (1.753 m)     Head Circumference --      Peak Flow --      Pain Score 04/10/22 1722 8     Pain Loc --      Pain Edu? --      Excl. in [place]? --    No data found.  Updated Vital Signs BP (!) 139/94 (BP Location: Left Arm)   Pulse 90   Temp 98.2 F (36.8 C) (Oral)   Ht '5\' 9"'$  (1.753 m)   Wt 205 lb (93 kg)   SpO2 99%   BMI 30.27 kg/m   Visual Acuity Right Eye  Distance:   Left Eye Distance:   Bilateral Distance:    Right Eye Near:   Left Eye Near:    Bilateral Near:     Physical Exam Constitutional:      Appearance: Normal appearance.  Eyes:     Extraocular Movements: Extraocular movements intact.  Pulmonary:     Effort: Pulmonary effort is normal.  Genitourinary:    Penis: Uncircumcised.      Comments: Thick Frayda Egley discharge with skin cracking noted to the penile head Neurological:     Mental Status: He is alert and oriented to person, place, and time. Mental status is at baseline.  Psychiatric:        Mood and Affect: Mood normal.        Behavior: Behavior normal.      UC Treatments / Results  Labs (all labs ordered are listed, but only abnormal results are displayed) Labs Reviewed - No data to display  EKG   Radiology No results found.  Procedures Procedures (including critical care time)  Medications Ordered in UC Medications - No data to display  Initial Impression / Assessment and Plan / UC Course  I have reviewed the triage vital signs and the nursing notes.  Pertinent labs & imaging results that were available during my care of the patient were reviewed by me and considered in my medical decision making (see chart for details).  Candidal balanitis  Discussed etiology of symptoms with patient, STI testing is pending, will treat per protocol, advised continued abstinence until symptoms cleared all lab testing has resolved, discussed hygiene measures to help clear symptoms and prescribed oral Diflucan weekly for 4 weeks and topical miconazole for treatment as patient does not have a PCP discussed that if no improvement seen after 2 weeks of consistent medication use he is to return urgent care for reevaluation in that area of symptoms have improved he came continue treatment until symptoms have resolved with follow-up as needed,  Final Clinical Impressions(s) / UC Diagnoses   Final diagnoses:  None   Discharge  Instructions   None    ED Prescriptions   None    PDMP not reviewed this encounter.   Hans Eden, NP 04/10/22 1805

## 2022-04-10 NOTE — ED Triage Notes (Signed)
Patient presents to UC for sores on his penis. Patient reports that his penis is red.   Patient denies swelling and  discharge.

## 2022-04-10 NOTE — Discharge Instructions (Signed)
Balanitis is swelling and irritation of the head of the penis (glans penis). Balanitis occurs most often among males who have not had their foreskin removed (uncircumcised). In uncircumcised males, the condition may also cause inflammation of the skin around the foreskin.  Carl [name] discoloration to the penis is consistent with the presence of yeast which is a fungal infection  Take Diflucan every week on the same day (if you take it today then you will take it every Tuesday for the next 3 weeks)  Apply miconazole cream to the penis twice daily until symptoms have cleared  Every day pull your foreskin back and clean around the penile head then apply cream as above  If symptoms have not improved after 2 weeks of consistent cleansing and medication use please follow-up with urgent care for reevaluation, if symptoms have begun to improve please continue treatment until symptoms have resolved with follow-up with urgent care as needed  Labs checking for HIV, syphilis, herpes, gonorrhea, chlamydia and trichomoniasis are pending, you will be called for positive test results only and treatment sent in as needed

## 2022-04-11 LAB — RPR: RPR Ser Ql: NONREACTIVE

## 2022-04-11 LAB — CYTOLOGY, (ORAL, ANAL, URETHRAL) ANCILLARY ONLY
Chlamydia: NEGATIVE
Comment: NEGATIVE
Comment: NEGATIVE
Comment: NORMAL
Neisseria Gonorrhea: NEGATIVE
Trichomonas: NEGATIVE

## 2022-04-13 LAB — HSV CULTURE AND TYPING

## 2022-04-19 ENCOUNTER — Inpatient Hospital Stay: Payer: No Typology Code available for payment source

## 2022-04-19 ENCOUNTER — Encounter: Payer: Self-pay | Admitting: Medical Oncology

## 2022-04-19 ENCOUNTER — Other Ambulatory Visit: Payer: Self-pay | Admitting: *Deleted

## 2022-04-19 ENCOUNTER — Emergency Department
Admission: EM | Admit: 2022-04-19 | Discharge: 2022-04-19 | Disposition: A | Discharge status: Home / Self Care | Payer: No Typology Code available for payment source | Attending: Student in an Organized Health Care Education/Training Program | Admitting: Student in an Organized Health Care Education/Training Program

## 2022-04-19 ENCOUNTER — Telehealth: Payer: Self-pay

## 2022-04-19 ENCOUNTER — Inpatient Hospital Stay (HOSPITAL_BASED_OUTPATIENT_CLINIC_OR_DEPARTMENT_OTHER): Payer: No Typology Code available for payment source | Admitting: Medical Oncology

## 2022-04-19 ENCOUNTER — Other Ambulatory Visit: Payer: Self-pay

## 2022-04-19 ENCOUNTER — Telehealth: Payer: Self-pay | Admitting: *Deleted

## 2022-04-19 VITALS — BP 134/109 | HR 127 | Temp 96.3°F | Ht 69.0 in | Wt 200.3 lb

## 2022-04-19 DIAGNOSIS — C9201 Acute myeloblastic leukemia, in remission: Secondary | ICD-10-CM

## 2022-04-19 DIAGNOSIS — R634 Abnormal weight loss: Secondary | ICD-10-CM

## 2022-04-19 DIAGNOSIS — E1165 Type 2 diabetes mellitus with hyperglycemia: Secondary | ICD-10-CM | POA: Diagnosis not present

## 2022-04-19 DIAGNOSIS — R5383 Other fatigue: Secondary | ICD-10-CM

## 2022-04-19 DIAGNOSIS — R739 Hyperglycemia, unspecified: Secondary | ICD-10-CM

## 2022-04-19 DIAGNOSIS — Z79899 Other long term (current) drug therapy: Secondary | ICD-10-CM

## 2022-04-19 LAB — CBC WITH DIFFERENTIAL/PLATELET
Abs Immature Granulocytes: 0 10*3/uL (ref 0.00–0.07)
Basophils Absolute: 0 10*3/uL (ref 0.0–0.1)
Basophils Relative: 1 %
Eosinophils Absolute: 0.1 10*3/uL (ref 0.0–0.5)
Eosinophils Relative: 1 %
HCT: 48.4 % (ref 39.0–52.0)
Hemoglobin: 16.7 g/dL (ref 13.0–17.0)
Immature Granulocytes: 0 %
Lymphocytes Relative: 36 %
Lymphs Abs: 1.3 10*3/uL (ref 0.7–4.0)
MCH: 29.5 pg (ref 26.0–34.0)
MCHC: 34.5 g/dL (ref 30.0–36.0)
MCV: 85.5 fL (ref 80.0–100.0)
Monocytes Absolute: 0.3 10*3/uL (ref 0.1–1.0)
Monocytes Relative: 7 %
Neutro Abs: 1.9 10*3/uL (ref 1.7–7.7)
Neutrophils Relative %: 55 %
Platelets: 207 10*3/uL (ref 150–400)
RBC: 5.66 MIL/uL (ref 4.22–5.81)
RDW: 12.3 % (ref 11.5–15.5)
WBC: 3.5 10*3/uL — ABNORMAL LOW (ref 4.0–10.5)
nRBC: 0 % (ref 0.0–0.2)

## 2022-04-19 LAB — CBC
HCT: 50.8 % (ref 39.0–52.0)
Hemoglobin: 17.3 g/dL — ABNORMAL HIGH (ref 13.0–17.0)
MCH: 29.3 pg (ref 26.0–34.0)
MCHC: 34.1 g/dL (ref 30.0–36.0)
MCV: 86 fL (ref 80.0–100.0)
Platelets: 217 10*3/uL (ref 150–400)
RBC: 5.91 MIL/uL — ABNORMAL HIGH (ref 4.22–5.81)
RDW: 12.2 % (ref 11.5–15.5)
WBC: 3.8 10*3/uL — ABNORMAL LOW (ref 4.0–10.5)
nRBC: 0 % (ref 0.0–0.2)

## 2022-04-19 LAB — COMPREHENSIVE METABOLIC PANEL WITH GFR
ALT: 65 U/L — ABNORMAL HIGH (ref 0–44)
AST: 39 U/L (ref 15–41)
Albumin: 4.3 g/dL (ref 3.5–5.0)
Alkaline Phosphatase: 115 U/L (ref 38–126)
Anion gap: 10 (ref 5–15)
BUN: 18 mg/dL (ref 6–20)
CO2: 23 mmol/L (ref 22–32)
Calcium: 9.2 mg/dL (ref 8.9–10.3)
Chloride: 97 mmol/L — ABNORMAL LOW (ref 98–111)
Creatinine, Ser: 1.57 mg/dL — ABNORMAL HIGH (ref 0.61–1.24)
GFR, Estimated: 55 mL/min — ABNORMAL LOW
Glucose, Bld: 578 mg/dL (ref 70–99)
Potassium: 4 mmol/L (ref 3.5–5.1)
Sodium: 130 mmol/L — ABNORMAL LOW (ref 135–145)
Total Bilirubin: 0.9 mg/dL (ref 0.3–1.2)
Total Protein: 7.6 g/dL (ref 6.5–8.1)

## 2022-04-19 LAB — URINALYSIS, ROUTINE W REFLEX MICROSCOPIC
Bacteria, UA: NONE SEEN
Bilirubin Urine: NEGATIVE
Glucose, UA: >=500 mg/dL — AB
Ketones, ur: NEGATIVE mg/dL
Leukocytes,Ua: NEGATIVE
Nitrite: NEGATIVE
Protein, ur: NEGATIVE mg/dL
Specific Gravity, Urine: 1.03 (ref 1.005–1.030)
Squamous Epithelial / HPF: NONE SEEN (ref 0–5)
pH: 6 (ref 5.0–8.0)

## 2022-04-19 LAB — BASIC METABOLIC PANEL WITH GFR
Anion gap: 11 (ref 5–15)
BUN: 18 mg/dL (ref 6–20)
CO2: 25 mmol/L (ref 22–32)
Calcium: 9.8 mg/dL (ref 8.9–10.3)
Chloride: 96 mmol/L — ABNORMAL LOW (ref 98–111)
Creatinine, Ser: 1.44 mg/dL — ABNORMAL HIGH (ref 0.61–1.24)
GFR, Estimated: >60 mL/min
Glucose, Bld: 548 mg/dL (ref 70–99)
Potassium: 4.1 mmol/L (ref 3.5–5.1)
Sodium: 132 mmol/L — ABNORMAL LOW (ref 135–145)

## 2022-04-19 LAB — CBG MONITORING, ED
Glucose-Capillary: 512 mg/dL (ref 70–99)
Glucose-Capillary: 361 mg/dL — ABNORMAL HIGH (ref 70–99)
Glucose-Capillary: 292 mg/dL — ABNORMAL HIGH (ref 70–99)

## 2022-04-19 LAB — TSH: TSH: 0.957 u[IU]/mL (ref 0.350–4.500)

## 2022-04-19 MED ORDER — GLIPIZIDE 5 MG PO TABS: 5.0000 mg | ORAL_TABLET | Freq: Every day | ORAL | 0 refills | Status: DC

## 2022-04-19 MED ORDER — SODIUM CHLORIDE 0.9 % IV BOLUS
1000.0000 mL | Freq: Once | INTRAVENOUS | Status: AC
  Administered 2022-04-19: 1000 mL via INTRAVENOUS

## 2022-04-19 MED ORDER — INSULIN ASPART 100 UNIT/ML IJ SOLN: 10.0000 [IU] | Freq: Once | INTRAMUSCULAR | Status: DC

## 2022-04-19 MED ORDER — INSULIN ASPART 100 UNIT/ML IJ SOLN
6.0000 [IU] | Freq: Once | INTRAMUSCULAR | Status: AC
  Administered 2022-04-19: 6 [IU] via SUBCUTANEOUS
  Filled 2022-04-19: qty 1

## 2022-04-19 MED ORDER — INSULIN ASPART 100 UNIT/ML IJ SOLN
10.0000 [IU] | Freq: Once | INTRAMUSCULAR | Status: AC
  Administered 2022-04-19: 10 [IU] via SUBCUTANEOUS
  Filled 2022-04-19: qty 1

## 2022-04-19 NOTE — Telephone Encounter (Signed)
Patient presented in clinic to day with symptoms of blurred vision, weight loss, always thirsty. Patient states he has lost 15 lbs since his last visit. [name] glucose came back at 578. Carl [name] advised patient to be seem in ER. Patient agreed and states his friend will drive him over.

## 2022-04-19 NOTE — ED Triage Notes (Addendum)
PT coming from home for increased thirt, increased urination. Pt does not have pmh of DM2. Pt was called by their doctor today about their glucose level being extremely high. PMH leukemia.

## 2022-04-19 NOTE — Progress Notes (Signed)
Patient here today presenting with sudden weightless, constant thirst and blurry vision.

## 2022-04-19 NOTE — Progress Notes (Signed)
Symptom Management [place] at [facility] [phone] Fax:([phone]) [phone]  Patient Care Team: System, Provider Not In as PCP - General [name], MD as Consulting Physician (Oncology)   Name of the patient: Carl [name]  [id number]  [birth year]   Date of visit: 04/19/22  Reason for Consult: Emiel Myrle Dues. is a 46 y.o. male with history of acute myeloid leukemia who presents today for:  Thirst: Patient reports that he has not felt well for the past 2-3 weeks. Fatigue, thirsty, urinating often. Slight visual changes. No vomiting, diarrhea, chest pain, SOB. He has been trying to stay hydrated with items such as water, juice and soda without relief. Few episodes of hyperglycemia in the past but no known history of DM2/DM1. Does have a history of pancreatitis.    Denies any neurologic complaints. Denies recent fevers or illnesses. Denies any easy bleeding or bruising. Reports good appetite and denies weight loss. Denies chest pain. Denies any nausea, vomiting, constipation, or diarrhea. Denies urinary complaints. Patient offers no further specific complaints today.    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 No pertinent surgical history.  HEMATOLOGY/ONCOLOGY HISTORY:  Oncology History   No history exists.    ALLERGIES:  is allergic to dapsone, primaquine, and rasburicase.  MEDICATIONS:  Current Outpatient Medications  Medication Sig Dispense Refill   azaCITIDine 300 MG TABS Take 300 mg by mouth daily. Take for 14 days, hold for 14 days. Repeat every 28 days. 14 tablet 6   fluconazole (DIFLUCAN) 150 MG tablet Take 1  tablet (150 mg total) by mouth once a week for 4 doses. 4 tablet 0   miconazole (MICOTIN) 2 % cream Apply 1 Application topically 2 (two) times daily. 28.35 g 0   ondansetron (ZOFRAN) 8 MG tablet Take 1 tablet 30 minutes before each dose of Onureg. Can take additional doses every 8 hours as needed if having nausea.     valACYclovir (VALTREX) 500 MG tablet Take 500 mg by mouth daily.     No current facility-administered medications for this visit.    VITAL SIGNS: BP (!) 134/109 (BP Location: Right Arm, Patient Position: Sitting)   Pulse (!) 127   Temp (!) 96.3 F (35.7 C) (Tympanic)   Ht '5\' 9"'$  (1.753 m)   Wt 200 lb 4.8 oz (90.9 kg)   BMI 29.58 kg/m  Filed Weights   04/19/22 1417  Weight: 200 lb 4.8 oz (90.9 kg)    Estimated body mass index is 29.58 kg/m as calculated from the following:   Height as of this encounter: '5\' 9"'$  (1.753 m).   Weight as of this encounter: 200 lb 4.8 oz (90.9 kg).  LABS: CBC:    Component Value Date/Time   WBC 3.8 (L) 04/19/2022 1511   HGB 17.3 (H) 04/19/2022 1511   HCT 50.8 04/19/2022 1511   HCT 44.2 03/29/2020 0826   PLT 217 04/19/2022 1511   MCV 86.0 04/19/2022 1511   NEUTROABS 1.9 04/19/2022 1403   LYMPHSABS 1.3 04/19/2022 1403   MONOABS 0.3 04/19/2022 1403   EOSABS 0.1 04/19/2022 1403   BASOSABS 0.0 04/19/2022 1403   Comprehensive Metabolic Panel:  Component Value Date/Time   NA 130 (L) 04/19/2022 1403   K 4.0 04/19/2022 1403   CL 97 (L) 04/19/2022 1403   CO2 23 04/19/2022 1403   BUN 18 04/19/2022 1403   CREATININE 1.57 (H) 04/19/2022 1403   CREATININE 0.94 12/10/2019 0859   GLUCOSE 578 (HH) 04/19/2022 1403   CALCIUM 9.2 04/19/2022 1403   AST 39 04/19/2022 1403   ALT 65 (H) 04/19/2022 1403   ALKPHOS 115 04/19/2022 1403   BILITOT 0.9 04/19/2022 1403   PROT 7.6 04/19/2022 1403   ALBUMIN 4.3 04/19/2022 1403    RADIOGRAPHIC STUDIES: No results found.  PERFORMANCE STATUS (ECOG) : 1 - Symptomatic but completely  ambulatory  Review of Systems Unless otherwise noted, a complete review of systems is negative.  Physical Exam General: Skin appears dry, NAD. HEENt: Eyes appear mildly injected Cardiovascular: Tachycardia with regular rhythm  Pulmonary: clear ant fields Abdomen: soft, nontender, + bowel sounds GU: no suprapubic tenderness Extremities: no edema, no joint deformities Skin: no rashes Neurological: Weakness but otherwise nonfocal  Assessment and Plan- Patient is a 46 y.o. male    Encounter Diagnoses  Name Primary?   Hyperglycemia Yes   AML (acute myeloid leukemia) in remission ([place])    High risk medication use    Acute onset diabetes. Discussed with patient. He will need further work up to rule out DKA, etc in ER along with IVF and IV insulin. Discussed with patient who is agreeable. He wishes to have his friend drive him to the hospital. ER triage nurse alerted.     Patient expressed understanding and was in agreement with this plan. He also understands that He can call clinic at any time with any questions, concerns, or complaints.   Thank you for allowing me to participate in the care of this very pleasant patient.   Time Total: 25  Visit consisted of counseling and education dealing with the complex and emotionally intense issues of symptom management in the setting of serious illness.Greater than 50%  of this time was spent counseling and coordinating care related to the above assessment and plan.  Signed by: Nelwyn Salisbury, PA-C

## 2022-04-19 NOTE — ED Provider Notes (Signed)
[facility] Provider Note    Event Date/Time   First MD Initiated Contact with Patient 04/19/22 1651     (approximate)   History   Hyperglycemia   HPI  Carl Miko Sirico. is a 46 y.o. male presents to the ER for evaluation of elevated blood sugars concern for blurry vision as well as increased urinary frequency over the past several weeks.  Does have significant family history of diabetes.  He has never had diabetes in the past.  He is on chemotherapy for history of AML.  Denies any fevers no chest pain or shortness of breath no nausea or vomiting.  He is tolerating p.o.  Does feel more fatigued than normal.     Physical Exam   Triage Vital Signs: ED Triage Vitals  Enc Vitals Group     BP 04/19/22 1516 (!) 141/90     Pulse Rate 04/19/22 1516 (!) 111     Resp 04/19/22 1516 12     Temp 04/19/22 1516 98.4 F (36.9 C)     Temp src --      SpO2 04/19/22 1516 98 %     Weight 04/19/22 1512 200 lb (90.7 kg)     Height --      Head Circumference --      Peak Flow --      Pain Score 04/19/22 1512 0     Pain Loc --      Pain Edu? --      Excl. in [place]? --     Most recent vital signs: Vitals:   04/19/22 1827 04/19/22 2012  BP: 132/87 (!) 134/103  Pulse: 85 72  Resp: 15 18  Temp: 98.2 F (36.8 C) 98.2 F (36.8 C)  SpO2: 97% 98%     Constitutional: Alert  Eyes: Conjunctivae are normal.  Head: Atraumatic. Nose: No congestion/rhinnorhea. Mouth/Throat: Mucous membranes are moist.   Neck: Painless ROM.  Cardiovascular:   Good peripheral circulation. Respiratory: Normal respiratory effort.  No retractions.  Gastrointestinal: Soft and nontender.  Musculoskeletal:  no deformity Neurologic:  MAE spontaneously. No gross focal neurologic deficits are appreciated.  Skin:  Skin is warm, dry and intact. No rash noted. Psychiatric: Mood and affect are normal. Speech and behavior are normal.    ED Results / Procedures / Treatments   Labs (all labs  ordered are listed, but only abnormal results are displayed) Labs Reviewed  BASIC METABOLIC PANEL - Abnormal; Notable for the following components:      Result Value   Sodium 132 (*)    Chloride 96 (*)    Glucose, Bld 548 (*)    Creatinine, Ser 1.44 (*)    All other components within normal limits  CBC - Abnormal; Notable for the following components:   WBC 3.8 (*)    RBC 5.91 (*)    Hemoglobin 17.3 (*)    All other components within normal limits  URINALYSIS, ROUTINE W REFLEX MICROSCOPIC - Abnormal; Notable for the following components:   Color, Urine STRAW (*)    APPearance CLEAR (*)    Glucose, UA >=500 (*)    Hgb urine dipstick SMALL (*)    All other components within normal limits  CBG MONITORING, ED - Abnormal; Notable for the following components:   Glucose-Capillary 512 (*)    All other components within normal limits  CBG MONITORING, ED - Abnormal; Notable for the following components:   Glucose-Capillary 361 (*)    All other components within  normal limits  CBG MONITORING, ED - Abnormal; Notable for the following components:   Glucose-Capillary 292 (*)    All other components within normal limits     EKG     RADIOLOGY    PROCEDURES:  Critical Care performed:   Procedures   MEDICATIONS ORDERED IN ED: Medications  sodium chloride 0.9 % bolus 1,000 mL (0 mLs Intravenous Stopped 04/19/22 1827)  insulin aspart (novoLOG) injection 10 Units (10 Units Subcutaneous Given 04/19/22 1713)  sodium chloride 0.9 % bolus 1,000 mL (0 mLs Intravenous Stopped 04/19/22 2009)  insulin aspart (novoLOG) injection 6 Units (6 Units Subcutaneous Given 04/19/22 1917)     IMPRESSION / MDM / ASSESSMENT AND PLAN / ED COURSE  I reviewed the triage vital signs and the nursing notes.                              Differential diagnosis includes, but is not limited to, hyperglycemia, DKA, HHS, dehydration, electrolyte abnormality, medication effect  She presented to the ER for  evaluation of symptoms as described above.  This presenting complaint could reflect a potentially life-threatening illness therefore the patient will be placed on continuous pulse oximetry and telemetry for monitoring.  Laboratory evaluation will be sent to evaluate for the above complaints.  Patient clinically well-appearing however and his laboratory evaluation is consistent with pure hyperglycemia no evidence of DKA.  Not consistent with HHS.  No acidosis no sign of infection.  Given family history suspect patient is now diabetic we will give IV fluids as well as dose of IV insulin.   Clinical Course as of 04/19/22 2032  Thu Apr 19, 2022  2031 [name] blood sugar has come down well after IV fluids and insulin.  Given his hyperglycemia and creatinine will start on glipizide low-dose daily.  Discussed importance of following up with his PCP regarding repeat blood checks and further titration of antihyperglycemic medications.  We discussed return precautions patient agreeable to plan. [PR]    Clinical Course User Index [PR] Merlyn Lot, MD     FINAL CLINICAL IMPRESSION(S) / ED DIAGNOSES   Final diagnoses:  Hyperglycemia     Rx / DC Orders   ED Discharge Orders          Ordered    glipiZIDE (GLUCOTROL) 5 MG tablet  Daily,   Status:  Discontinued        04/19/22 1915    glipiZIDE (GLUCOTROL) 5 MG tablet  Daily        04/19/22 2030             Note:  This document was prepared using Dragon voice recognition software and may include unintentional dictation errors.    Merlyn Lot, MD 04/19/22 2032

## 2022-04-19 NOTE — Telephone Encounter (Signed)
Can you have him seen in [facility] with labs cbc with diff and cmp?

## 2022-04-19 NOTE — Telephone Encounter (Signed)
Yes message sent to [facility]

## 2022-04-19 NOTE — Telephone Encounter (Signed)
Attempted to reach patient to schedule [facility] visit this afternoon. No answer. Vm left

## 2022-04-19 NOTE — Telephone Encounter (Signed)
Patient called back and accepted an apt for 2pm today for labs. He normally rides [employer] transportation to his apts. However, he will see if he can get a ride today for this apt. If he can not find transportation he will call me back.

## 2022-04-19 NOTE — Telephone Encounter (Signed)
Patient called stating that he has lost from 215 to 197 (18 pounds) in the past 9 days, He states that  he feels dehydrated and is drinking a lot of water and is craving sweet sodas.His vision is blurry. He states we went to see a doctor in [facility] (Urgent Care) last week for an infection and was put on antibiotics for that.   Initial Impression / Assessment and Plan / UC Course  I have reviewed the triage vital signs and the nursing notes.   Pertinent labs & imaging results that were available during my care of the patient were reviewed by me and considered in my medical decision making (see chart for details).   Candidal balanitis   Discussed etiology of symptoms with patient, STI testing is pending, will treat per protocol, advised continued abstinence until symptoms cleared all lab testing has resolved, discussed hygiene measures to help clear symptoms and prescribed oral Diflucan weekly for 4 weeks and topical miconazole for treatment as patient does not have a PCP discussed that if no improvement seen after 2 weeks of consistent medication use he is to return urgent care for reevaluation in that area of symptoms have improved he came continue treatment until symptoms have resolved with follow-up as needed

## 2022-04-25 ENCOUNTER — Encounter: Payer: Self-pay | Admitting: Hematology and Oncology

## 2022-05-01 ENCOUNTER — Ambulatory Visit: Payer: Self-pay

## 2022-05-01 NOTE — Telephone Encounter (Signed)
  Chief Complaint: Can't see small print close up Symptoms: ibid Frequency: about 2 weeks Pertinent Negatives: Patient denies HA, weakness Disposition: '[]'$ ED /'[]'$ Urgent Care (no appt availability in office) / '[x]'$ Appointment(In office/virtual)/ '[]'$  [place] [facility]/ '[]'$ Home Care/ '[]'$ Refused Recommended Disposition /'[]'$  Mobile Bus/ '[]'$  Follow-up with PCP Additional Notes: PT will go to retailer that has readers and try a pair at the store. Pt reports difficulty see in small print close up. Pt has upcoming appt with PCP within 2 weeks for follow up.    Reason for Disposition  [1] Blurred vision or visual changes AND [2] gradual onset (e.g., weeks, months)  Answer Assessment - Initial Assessment Questions 1. DESCRIPTION: "How has your vision changed?" (e.g., complete vision loss, blurred vision, double vision, floaters, etc.)     Can't see small print. 2. LOCATION: "One or both eyes?" If one, ask: "Which eye?"     Both eyes 3. SEVERITY: "Can you see anything?" If Yes, ask: "What can you see?" (e.g., fine print)     Yes can't see small print 4. ONSET: "When did this begin?" "Did it start suddenly or has this been gradual?"     2 weeks 5. PATTERN: "Does this come and go, or has it been constant since it started?"     Comes and goes 6. PAIN: "Is there any pain in your eye(s)?"  (Scale 1-10; or mild, moderate, severe)   - NONE (0): No pain.   - MILD (1-3): Doesn't interfere with normal activities.   - MODERATE (4-7): Interferes with normal activities or awakens from sleep.    - SEVERE (8-10): Excruciating pain, unable to do any normal activities.     none 7. CONTACTS-GLASSES: "Do you wear contacts or glasses?"     Not currently 8. CAUSE: "What do you think is causing this visual problem?"     Unsure - diabetes? 9. OTHER SYMPTOMS: "Do you have any other symptoms?" (e.g., confusion, headache, arm or leg weakness, speech problems)     no 10. PREGNANCY: "Is there any chance you are  pregnant?" "When was your last menstrual period?"       na  Protocols used: Vision Loss or Change-A-AH

## 2022-05-04 ENCOUNTER — Encounter: Payer: Self-pay | Admitting: Oncology

## 2022-05-04 ENCOUNTER — Inpatient Hospital Stay: Payer: No Typology Code available for payment source | Attending: Oncology

## 2022-05-04 ENCOUNTER — Inpatient Hospital Stay (HOSPITAL_BASED_OUTPATIENT_CLINIC_OR_DEPARTMENT_OTHER): Payer: No Typology Code available for payment source | Admitting: Oncology

## 2022-05-04 VITALS — BP 115/59 | HR 72 | Temp 98.7°F | Resp 18 | Wt 206.6 lb

## 2022-05-04 DIAGNOSIS — Z7985 Long-term (current) use of injectable non-insulin antidiabetic drugs: Secondary | ICD-10-CM | POA: Insufficient documentation

## 2022-05-04 DIAGNOSIS — E119 Type 2 diabetes mellitus without complications: Secondary | ICD-10-CM | POA: Insufficient documentation

## 2022-05-04 DIAGNOSIS — C9201 Acute myeloblastic leukemia, in remission: Secondary | ICD-10-CM

## 2022-05-04 DIAGNOSIS — Z79899 Other long term (current) drug therapy: Secondary | ICD-10-CM | POA: Diagnosis not present

## 2022-05-04 DIAGNOSIS — H538 Other visual disturbances: Secondary | ICD-10-CM

## 2022-05-04 LAB — COMPREHENSIVE METABOLIC PANEL
ALT: 37 U/L (ref 0–44)
AST: 23 U/L (ref 15–41)
Albumin: 4 g/dL (ref 3.5–5.0)
Alkaline Phosphatase: 80 U/L (ref 38–126)
Anion gap: 5 (ref 5–15)
BUN: 13 mg/dL (ref 6–20)
CO2: 26 mmol/L (ref 22–32)
Calcium: 9.1 mg/dL (ref 8.9–10.3)
Chloride: 105 mmol/L (ref 98–111)
Creatinine, Ser: 1.22 mg/dL (ref 0.61–1.24)
GFR, Estimated: >60 mL/min (ref >60)
Glucose, Bld: 131 mg/dL — ABNORMAL HIGH (ref 70–99)
Potassium: 3.9 mmol/L (ref 3.5–5.1)
Sodium: 136 mmol/L (ref 135–145)
Total Bilirubin: 2.3 mg/dL — ABNORMAL HIGH (ref 0.3–1.2)
Total Protein: 6.8 g/dL (ref 6.5–8.1)

## 2022-05-04 LAB — CBC WITH DIFFERENTIAL/PLATELET
Abs Immature Granulocytes: 0.05 10*3/uL (ref 0.00–0.07)
Basophils Absolute: 0.1 10*3/uL (ref 0.0–0.1)
Basophils Relative: 2 %
Eosinophils Absolute: 0.1 10*3/uL (ref 0.0–0.5)
Eosinophils Relative: 3 %
HCT: 38.1 % — ABNORMAL LOW (ref 39.0–52.0)
Hemoglobin: 12.4 g/dL — ABNORMAL LOW (ref 13.0–17.0)
Immature Granulocytes: 2 %
Lymphocytes Relative: 33 %
Lymphs Abs: 1 10*3/uL (ref 0.7–4.0)
MCH: 30.2 pg (ref 26.0–34.0)
MCHC: 32.5 g/dL (ref 30.0–36.0)
MCV: 92.7 fL (ref 80.0–100.0)
Monocytes Absolute: 0.2 10*3/uL (ref 0.1–1.0)
Monocytes Relative: 5 %
Neutro Abs: 1.6 10*3/uL — ABNORMAL LOW (ref 1.7–7.7)
Neutrophils Relative %: 55 %
Platelets: 198 10*3/uL (ref 150–400)
RBC: 4.11 MIL/uL — ABNORMAL LOW (ref 4.22–5.81)
RDW: 14 % (ref 11.5–15.5)
WBC: 3 10*3/uL — ABNORMAL LOW (ref 4.0–10.5)
nRBC: 1.3 % — ABNORMAL HIGH (ref 0.0–0.2)

## 2022-05-04 NOTE — Progress Notes (Signed)
Pt states he has noticed recent changes in his vision. Vision tends to be very blurry; when watching tv everything "glows around him". Has a NP appt with PCP on 05/18/22.

## 2022-05-04 NOTE — Progress Notes (Signed)
Hematology/Oncology Consult note [facility]  Telephone:([phone] Fax:([phone]) [phone]  Patient Care Team: System, Provider Not In as PCP - General [name], MD as Consulting Physician (Oncology)   Name of the patient: Carl [name]  [id number]  12-07-1975   Date of visit: 05/04/22  Diagnosis- history of AML in remission  Chief complaint/ Reason for visit-routine follow-up of AML on oral Vidaza  Heme/Onc history: Carl Ryman Rathgeber. is a 46 y.o. 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 Gene Coding Predicted Protein Variant allele fraction  U2AF1 c.101C>T p.(Ser34Phe) 12.9 %  RUNX1 c.620_621insATCCCCCG p.(Gln208Serfs*6) 6.7 %  NRAS c.38G>A p.(Gly13Asp) 9.2 %   Variants of Unknown Clinical Significance:  Gene Coding Predicted Protein Variant allele fraction  TET2 c.4493G>A p.(Arg1498His) 48.2 %  ETV6 c.776G>A p.(Arg259Gln) 49.1 %   Pertinent Phenotypic data: blasts express CD7, CD13, CD34, CD38, CD71, CD117, and HLA-DR.   He received cytarabine and daunorubicin (7+3) beginning 04/13/2020 and 05/27/2020.  Course has been complicated by fever and neutropenia with mucositis (04/27/2020) and gluteal cleft cellulitis (05/06/2020).Received induction with 7+3+HD with delayed count recovery. D42 BMBx revealed persistent disease and patient re-induced with 7+3.  He then went on to receive consolidation therapy with HiDAC and cycle 4 received on 11/04/2020.  He follows up with Dr. Royce Macadamia at [facility]. Last bone marrow biopsy on 12/05/2020 which showed hypercellular bone marrow with trilineage hematopoiesis and 3% blasts by manual differential.   He is presently on azacitidine p.o. 300 mg daily 2 weeks on 2 weeks off.    Interval history-patient was seen last week in the clinic for blurry vision and was found to have hyperglycemia with a blood sugar in the 500s.  He was sent to the ER and was subsequently discharged on glipizide.  Blood sugars  are under better control presently.  He has an upcoming appointment with his new PCP in 2 weeks.  Patient continues to report blurry vision and difficulty reading his newspaper.  Denies any focal numbness or weakness.  ECOG PS- 1 Pain scale- 0   Review of systems- Review of Systems  Constitutional:  Negative for chills, fever, malaise/fatigue and weight loss.  HENT:  Negative for congestion, ear discharge and nosebleeds.   Eyes:  Negative for blurred vision.  Respiratory:  Negative for cough, hemoptysis, sputum production, shortness of breath and wheezing.   Cardiovascular:  Negative for chest pain, palpitations, orthopnea and claudication.  Gastrointestinal:  Negative for abdominal pain, blood in stool, constipation, diarrhea, heartburn, melena, nausea and vomiting.  Genitourinary:  Negative for dysuria, flank pain, frequency, hematuria and urgency.  Musculoskeletal:  Negative for back pain, joint pain and myalgias.  Skin:  Negative for rash.  Neurological:  Negative for dizziness, tingling, focal weakness, seizures, weakness and headaches.       Blurry vision  Endo/Heme/Allergies:  Does not bruise/bleed easily.  Psychiatric/Behavioral:  Negative for depression and suicidal ideas. The patient does not have insomnia.       Allergies  Allergen Reactions   Dapsone     Contraindication - G6PD deficient   Primaquine     Contraindication - G6PD deficient   Rasburicase     Contraindication - G6PD deficient     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[place])    Agranulocytosis secondary to cancer chemotherapy (CODE) ([place])    Chemotherapy induced neutropenia (HCC)    Pancreatitis    Pancytopenia (HCC)    Thrombocytopenia ([place])  Tobacco use disorder      History reviewed. No pertinent surgical history.  Social History   Socioeconomic History   Marital status: Single    Spouse name: Not on file   Number of children: Not on file   Years  of education: Not on file   Highest education level: Not on file  Occupational History   Not on file  Tobacco Use   Smoking status: Former    Packs/day: 1.00    Years: 17.00    Total pack years: 17.00    Types: Cigarettes    Quit date: 03/27/2020    Years since quitting: 2.1   Smokeless tobacco: Never  Vaping Use   Vaping Use: Never used  Substance and Sexual Activity   Alcohol use: Yes    Comment: Socially   Drug use: Yes    Types: Marijuana   Sexual activity: Yes  Other Topics Concern   Not on file  Social History Narrative   Not on file   Social Determinants of Health   Financial Resource Strain: Not on file  Food Insecurity: Not on file  Transportation Needs: No Transportation Needs (02/05/2022)   PRAPARE - Hydrologist (Medical): No    Lack of Transportation (Non-Medical): No  Physical Activity: Not on file  Stress: Not on file  Social Connections: Not on file  Intimate Partner Violence: Not on file    Family History  Problem Relation Age of Onset   Diabetes Father    Prostate cancer Neg Hx    Kidney cancer Neg Hx    Bladder Cancer Neg Hx      Current Outpatient Medications:    azaCITIDine 300 MG TABS, Take 300 mg by mouth daily. Take for 14 days, hold for 14 days. Repeat every 28 days., Disp: 14 tablet, Rfl: 6   glipiZIDE (GLUCOTROL) 5 MG tablet, Take 1 tablet (5 mg total) by mouth daily., Disp: 30 tablet, Rfl: 0   miconazole (MICOTIN) 2 % cream, Apply 1 Application topically 2 (two) times daily., Disp: 28.35 g, Rfl: 0   ondansetron (ZOFRAN) 8 MG tablet, Take 1 tablet 30 minutes before each dose of Onureg. Can take additional doses every 8 hours as needed if having nausea., Disp: , Rfl:    valACYclovir (VALTREX) 500 MG tablet, Take 500 mg by mouth daily., Disp: , Rfl:   Physical exam:  Vitals:   05/04/22 1041  BP: (!) 115/59  Pulse: 72  Resp: 18  Temp: 98.7 F (37.1 C)  SpO2: 100%  Weight: 206 lb 9.6 oz (93.7 kg)    Physical Exam Cardiovascular:     Rate and Rhythm: Normal rate and regular rhythm.     Heart sounds: Normal heart sounds.  Pulmonary:     Effort: Pulmonary effort is normal.     Breath sounds: Normal breath sounds.  Abdominal:     General: Bowel sounds are normal.     Palpations: Abdomen is soft.  Skin:    General: Skin is warm and dry.  Neurological:     General: No focal deficit present.     Mental Status: He is alert and oriented to person, place, and time.         Latest Ref Rng & Units 05/04/2022   10:20 AM  CMP  Glucose 70 - 99 mg/dL 131   BUN 6 - 20 mg/dL 13   Creatinine 0.61 - 1.24 mg/dL 1.22   Sodium 135 - 145 mmol/L 136  Potassium 3.5 - 5.1 mmol/L 3.9   Chloride 98 - 111 mmol/L 105   CO2 22 - 32 mmol/L 26   Calcium 8.9 - 10.3 mg/dL 9.1   Total Protein 6.5 - 8.1 g/dL 6.8   Total Bilirubin 0.3 - 1.2 mg/dL 2.3   Alkaline Phos 38 - 126 U/L 80   AST 15 - 41 U/L 23   ALT 0 - 44 U/L 37       Latest Ref Rng & Units 05/04/2022   10:20 AM  CBC  WBC 4.0 - 10.5 K/uL 3.0   Hemoglobin 13.0 - 17.0 g/dL 12.4   Hematocrit 39.0 - 52.0 % 38.1   Platelets 150 - 400 K/uL 198     Assessment and plan- Patient is a 46 y.o. male history of AML in remission currently on oral Vidaza here for a routine follow-up  [name] white cell count typically fluctuates between 3-4.  Presently it is down to 3 with an ANC of 1.6.  Hemoglobin is also somewhat lower at 12.4 as compared to 15 in the past.  Peripheral smear shows some evidence of nucleated red blood cells.  I inclined to monitor this with a repeat CBC with differential in 1 month.  If there is a continued downward trend in his hemoglobin and/or white cell count he will likely need a repeat bone marrow biopsy.  Newly diagnosed diabetes: Patient is currently on glipizide and will follow-up with his new PCP in 2 weeks time.  Blurry vision: Etiology unclear.  No focal neurological deficits.  I will proceed with an MRI brain with and  without contrast at this time and if it is unremarkable I will have him seen by ophthalmology.   Visit Diagnosis 1. Blurry vision   2. AML (acute myeloid leukemia) in remission ([place])   3. High risk medication use      Dr. Randa Evens, MD, MPH [facility] at [facility] [phone] 05/04/2022 12:44 PM

## 2022-05-09 ENCOUNTER — Telehealth: Payer: Self-pay

## 2022-05-09 NOTE — Telephone Encounter (Signed)
Faxed pts Long Term Disability paperwork to [employer] at [phone] and received a fax confirmation.

## 2022-05-11 ENCOUNTER — Ambulatory Visit
Admission: RE | Admit: 2022-05-11 | Discharge: 2022-05-11 | Disposition: A | Discharge status: Home / Self Care | Payer: No Typology Code available for payment source | Source: Ambulatory Visit | Attending: Oncology | Admitting: Oncology

## 2022-05-11 DIAGNOSIS — H538 Other visual disturbances: Secondary | ICD-10-CM | POA: Diagnosis present

## 2022-05-11 MED ORDER — GADOBUTROL 1 MMOL/ML IV SOLN
9.0000 mL | Freq: Once | INTRAVENOUS | Status: AC | PRN
  Administered 2022-05-11: 9 mL via INTRAVENOUS

## 2022-05-14 ENCOUNTER — Other Ambulatory Visit: Payer: No Typology Code available for payment source

## 2022-05-14 ENCOUNTER — Ambulatory Visit: Payer: No Typology Code available for payment source | Admitting: Oncology

## 2022-05-18 ENCOUNTER — Encounter: Payer: Self-pay | Admitting: Nurse Practitioner

## 2022-05-18 ENCOUNTER — Ambulatory Visit (INDEPENDENT_AMBULATORY_CARE_PROVIDER_SITE_OTHER): Payer: No Typology Code available for payment source | Admitting: Nurse Practitioner

## 2022-05-18 VITALS — BP 111/76 | HR 81 | Ht 69.0 in | Wt 205.6 lb

## 2022-05-18 DIAGNOSIS — R739 Hyperglycemia, unspecified: Secondary | ICD-10-CM

## 2022-05-18 DIAGNOSIS — C9201 Acute myeloblastic leukemia, in remission: Secondary | ICD-10-CM | POA: Diagnosis not present

## 2022-05-18 LAB — POCT GLYCOSYLATED HEMOGLOBIN (HGB A1C): HbA1c POC (<> result, manual entry): 5.2 % (ref 4.0–5.6)

## 2022-05-18 MED ORDER — GLIPIZIDE 5 MG PO TABS: 5.0000 mg | ORAL_TABLET | Freq: Every day | ORAL | 1 refills | Status: DC

## 2022-05-18 NOTE — Progress Notes (Signed)
New Patient Office Visit  Subjective    Patient ID: Carl Flemmer., male    DOB: 1975-11-05  Age: 46 y.o. MRN: [id number]  CC:  Chief Complaint  Patient presents with   New Patient (Initial Visit)    HPI Carl [name]. presents to establish care. His does not have a previous PCP. He has h/o myeloid leukemia and is on chemotherapy.  Patient had elevated blood sugar (>500)readings on 04/19/2022 at the oncologist office and was sent to the ER for further evaluation.  Patient was given insulin in the ED and was discharged on glipizide 500 mg daily.    Outpatient Encounter Medications as of 05/18/2022  Medication Sig   azaCITIDine 300 MG TABS Take 300 mg by mouth daily. Take for 14 days, hold for 14 days. Repeat every 28 days.   glipiZIDE (GLUCOTROL) 5 MG tablet Take 1 tablet (5 mg total) by mouth daily.   miconazole (MICOTIN) 2 % cream Apply 1 Application topically 2 (two) times daily.   ondansetron (ZOFRAN) 8 MG tablet Take 1 tablet 30 minutes before each dose of Onureg. Can take additional doses every 8 hours as needed if having nausea.   valACYclovir (VALTREX) 500 MG tablet Take 500 mg by mouth daily.   [DISCONTINUED] glipiZIDE (GLUCOTROL) 5 MG tablet Take 1 tablet (5 mg total) by mouth daily.   No facility-administered encounter medications on file as of 05/18/2022.    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[place])    Agranulocytosis secondary to cancer chemotherapy (CODE) ([place])    Chemotherapy induced neutropenia (HCC)    Pancreatitis    Pancytopenia (HCC)    Thrombocytopenia (HCC)    Tobacco use disorder     History reviewed. No pertinent surgical history.  Family History  Problem Relation Age of Onset   Hypertension Mother    Diabetes Mother    Diabetes Father    Prostate cancer Neg Hx    Kidney cancer Neg Hx    Bladder Cancer Neg Hx     Social History   Socioeconomic History   Marital status: Single    Spouse name:  Not on file   Number of children: Not on file   Years of education: Not on file   Highest education level: Not on file  Occupational History   Not on file  Tobacco Use   Smoking status: Former    Packs/day: 1.00    Years: 17.00    Total pack years: 17.00    Types: Cigarettes    Quit date: 03/27/2020    Years since quitting: 2.1   Smokeless tobacco: Never  Vaping Use   Vaping Use: Never used  Substance and Sexual Activity   Alcohol use: Yes    Comment: Socially   Drug use: Not Currently   Sexual activity: Yes  Other Topics Concern   Not on file  Social History Narrative   Not on file   Social Determinants of Health   Financial Resource Strain: Not on file  Food Insecurity: Not on file  Transportation Needs: No Transportation Needs (02/05/2022)   PRAPARE - Hydrologist (Medical): No    Lack of Transportation (Non-Medical): No  Physical Activity: Not on file  Stress: Not on file  Social Connections: Not on file  Intimate Partner Violence: Not on file    Review of Systems  Constitutional: Negative.   HENT: Negative.    Eyes: Negative.  Respiratory: Negative.    Cardiovascular: Negative.   Gastrointestinal: Negative.   Genitourinary: Negative.   Musculoskeletal: Negative.   Skin: Negative.   Neurological: Negative.   Psychiatric/Behavioral: Negative.          Objective    BP 111/76   Pulse 81   Ht '5\' 9"'$  (1.753 m)   Wt 205 lb 9 oz (93.2 kg)   SpO2 99%   BMI 30.36 kg/m   Physical Exam Constitutional:      Appearance: Normal appearance. He is obese.  HENT:     Head: Normocephalic.     Right Ear: Tympanic membrane normal.     Left Ear: Tympanic membrane normal.     Nose: Nose normal.     Mouth/Throat:     Mouth: Mucous membranes are moist.  Eyes:     Conjunctiva/sclera: Conjunctivae normal.     Pupils: Pupils are equal, round, and reactive to light.  Cardiovascular:     Rate and Rhythm: Normal rate and regular rhythm.      Pulses: Normal pulses.  Pulmonary:     Effort: Pulmonary effort is normal.     Breath sounds: Normal breath sounds. No stridor. No wheezing.  Abdominal:     General: Bowel sounds are normal.     Palpations: Abdomen is soft.  Musculoskeletal:        General: Normal range of motion.  Skin:    General: Skin is warm.  Neurological:     General: No focal deficit present.     Mental Status: He is alert and oriented to person, place, and time. Mental status is at baseline.  Psychiatric:        Mood and Affect: Mood normal.        Behavior: Behavior normal.        Thought Content: Thought content normal.        Judgment: Judgment normal.         Assessment & Plan:   Problem List Items Addressed This Visit       Other   Acute myeloid leukemia in remission ([place])    On chemotherapy azacitidine 300 mg. Followed by oncologist Dr. Janese Banks      Hyperglycemia - Primary    Patient hemoglobin 5.2 in the office today. Advised patient to take glipizide 5 mg daily. Advised patient to check blood sugars at home and bring the number on next office appointment. Education provided on diet and exercise      Relevant Orders   POCT glycosylated hemoglobin (Hb A1C) (Completed)    Return in about 1 month (around 06/17/2022).   Theresia Lo, NP

## 2022-05-26 DIAGNOSIS — R739 Hyperglycemia, unspecified: Secondary | ICD-10-CM | POA: Insufficient documentation

## 2022-05-26 NOTE — Assessment & Plan Note (Signed)
On chemotherapy azacitidine 300 mg. Followed by oncologist Dr. Janese Banks

## 2022-05-26 NOTE — Assessment & Plan Note (Signed)
Patient hemoglobin 5.2 in the office today. Advised patient to take glipizide 5 mg daily. Advised patient to check blood sugars at home and bring the number on next office appointment. Education provided on diet and exercise

## 2022-06-04 ENCOUNTER — Inpatient Hospital Stay: Payer: No Typology Code available for payment source | Attending: Oncology

## 2022-06-04 ENCOUNTER — Inpatient Hospital Stay: Payer: No Typology Code available for payment source

## 2022-06-04 DIAGNOSIS — C9201 Acute myeloblastic leukemia, in remission: Secondary | ICD-10-CM

## 2022-06-04 LAB — CBC WITH DIFFERENTIAL/PLATELET
Abs Immature Granulocytes: 0.01 10*3/uL (ref 0.00–0.07)
Basophils Absolute: 0.1 10*3/uL (ref 0.0–0.1)
Basophils Relative: 3 %
Eosinophils Absolute: 0.1 10*3/uL (ref 0.0–0.5)
Eosinophils Relative: 2 %
HCT: 45.3 % (ref 39.0–52.0)
Hemoglobin: 15.1 g/dL (ref 13.0–17.0)
Immature Granulocytes: 0 %
Lymphocytes Relative: 30 %
Lymphs Abs: 0.9 10*3/uL (ref 0.7–4.0)
MCH: 31.7 pg (ref 26.0–34.0)
MCHC: 33.3 g/dL (ref 30.0–36.0)
MCV: 95 fL (ref 80.0–100.0)
Monocytes Absolute: 0.2 10*3/uL (ref 0.1–1.0)
Monocytes Relative: 8 %
Neutro Abs: 1.6 10*3/uL — ABNORMAL LOW (ref 1.7–7.7)
Neutrophils Relative %: 57 %
Platelets: 168 10*3/uL (ref 150–400)
RBC: 4.77 MIL/uL (ref 4.22–5.81)
RDW: 13.9 % (ref 11.5–15.5)
WBC: 2.8 10*3/uL — ABNORMAL LOW (ref 4.0–10.5)
nRBC: 0 % (ref 0.0–0.2)

## 2022-06-04 LAB — COMPREHENSIVE METABOLIC PANEL
ALT: 45 U/L — ABNORMAL HIGH (ref 0–44)
AST: 25 U/L (ref 15–41)
Albumin: 4.5 g/dL (ref 3.5–5.0)
Alkaline Phosphatase: 78 U/L (ref 38–126)
Anion gap: 7 (ref 5–15)
BUN: 11 mg/dL (ref 6–20)
CO2: 26 mmol/L (ref 22–32)
Calcium: 9.4 mg/dL (ref 8.9–10.3)
Chloride: 105 mmol/L (ref 98–111)
Creatinine, Ser: 1.02 mg/dL (ref 0.61–1.24)
GFR, Estimated: >60 mL/min (ref >60)
Glucose, Bld: 117 mg/dL — ABNORMAL HIGH (ref 70–99)
Potassium: 4.7 mmol/L (ref 3.5–5.1)
Sodium: 138 mmol/L (ref 135–145)
Total Bilirubin: 0.7 mg/dL (ref 0.3–1.2)
Total Protein: 7.6 g/dL (ref 6.5–8.1)

## 2022-06-21 ENCOUNTER — Ambulatory Visit: Payer: No Typology Code available for payment source | Admitting: Family Medicine

## 2022-06-27 ENCOUNTER — Encounter: Payer: Self-pay | Admitting: Hematology and Oncology

## 2022-06-28 ENCOUNTER — Telehealth: Payer: Self-pay | Admitting: Oncology

## 2022-06-28 ENCOUNTER — Other Ambulatory Visit: Payer: Self-pay | Admitting: *Deleted

## 2022-06-28 DIAGNOSIS — R519 Headache, unspecified: Secondary | ICD-10-CM

## 2022-06-28 DIAGNOSIS — C9201 Acute myeloblastic leukemia, in remission: Secondary | ICD-10-CM

## 2022-06-28 MED ORDER — ONDANSETRON HCL 8 MG PO TABS: 8.0000 mg | ORAL_TABLET | Freq: Three times a day (TID) | ORAL | 1 refills | Status: DC | PRN

## 2022-06-28 NOTE — Telephone Encounter (Addendum)
Called pt and he said that last 4 days , pt gets HA. . If he lies down the HA will go away. I told him that the med he is on does not cause HA. I asked about his blood sugars if they are high. He says that have been about 120 to 150. He says last month and once this month that he coughed  and had tiny drops 3-4 in sputum. He does not feel like he has a cold and no sinus pressure. It usually happens when he works out. He also said that sometimes he gets nauseated and he has no more of zofran. I told him that I am sending message to Janese Banks and she will let me know what to do and we will refill the zofran. I have sent the message to Dr. Janese Banks but I did tell him she is not here today. Pt is aware.  Dr. Janese Banks sent me a my chart and said that he already had MRI of brain for blurry vision in Sept. So send the pt a ref. For neurology. I called pt and spoke to him about what Dr. Janese Banks said and he is ok with this. I told him I am trying to get him a visit in [place]. I will let him know when I get appt. Some times it can take a month to get it in.he is ok with this. I called [facility] and they due have 1 MD for neurology and it be about 3 weeks.

## 2022-06-28 NOTE — Addendum Note (Signed)
Addended by: Luella Cook on: 06/28/2022 04:03 PM   Modules accepted: Orders

## 2022-06-28 NOTE — Telephone Encounter (Signed)
Pt left a VM and stated that he has been having headaches for the last 5 days. He wants to speak to someone on the clinical team to discuss his symptoms.

## 2022-06-29 ENCOUNTER — Other Ambulatory Visit: Payer: Self-pay | Admitting: *Deleted

## 2022-06-29 ENCOUNTER — Telehealth: Payer: Self-pay | Admitting: *Deleted

## 2022-06-29 MED ORDER — PROCHLORPERAZINE MALEATE 10 MG PO TABS: 10.0000 mg | ORAL_TABLET | Freq: Four times a day (QID) | ORAL | 0 refills | Status: DC | PRN

## 2022-06-29 NOTE — Telephone Encounter (Signed)
I called the [facility] in [place] and they do not have the patient's insurance on file.  I spoke to the patient and he says he has [employer].  I have now called back to the pharmacy and they are going to get his [employer] that I had just faxed to them and then let me know what the amount is going to cost him after the insurance and then I will see if I can do anything to help him with the co-pay.  Patient is aware and when I get the information I will be calling him back

## 2022-07-04 ENCOUNTER — Telehealth: Payer: Self-pay

## 2022-07-04 ENCOUNTER — Other Ambulatory Visit (HOSPITAL_COMMUNITY): Payer: Self-pay

## 2022-07-04 ENCOUNTER — Inpatient Hospital Stay: Payer: Medicaid Other | Attending: Oncology

## 2022-07-04 ENCOUNTER — Inpatient Hospital Stay: Payer: Medicaid Other

## 2022-07-04 ENCOUNTER — Inpatient Hospital Stay (HOSPITAL_BASED_OUTPATIENT_CLINIC_OR_DEPARTMENT_OTHER): Payer: Medicaid Other | Admitting: Oncology

## 2022-07-04 VITALS — BP 126/78 | HR 78 | Temp 96.6°F | Resp 18 | Wt 212.8 lb

## 2022-07-04 DIAGNOSIS — D696 Thrombocytopenia, unspecified: Secondary | ICD-10-CM | POA: Diagnosis not present

## 2022-07-04 DIAGNOSIS — D709 Neutropenia, unspecified: Secondary | ICD-10-CM | POA: Insufficient documentation

## 2022-07-04 DIAGNOSIS — C9201 Acute myeloblastic leukemia, in remission: Secondary | ICD-10-CM | POA: Insufficient documentation

## 2022-07-04 DIAGNOSIS — R519 Headache, unspecified: Secondary | ICD-10-CM | POA: Diagnosis not present

## 2022-07-04 LAB — CBC WITH DIFFERENTIAL/PLATELET
Abs Immature Granulocytes: 0 10*3/uL (ref 0.00–0.07)
Basophils Absolute: 0.1 10*3/uL (ref 0.0–0.1)
Basophils Relative: 2 %
Eosinophils Absolute: 0.1 10*3/uL (ref 0.0–0.5)
Eosinophils Relative: 2 %
HCT: 47.8 % (ref 39.0–52.0)
Hemoglobin: 16 g/dL (ref 13.0–17.0)
Immature Granulocytes: 0 %
Lymphocytes Relative: 40 %
Lymphs Abs: 1.2 10*3/uL (ref 0.7–4.0)
MCH: 31 pg (ref 26.0–34.0)
MCHC: 33.5 g/dL (ref 30.0–36.0)
MCV: 92.6 fL (ref 80.0–100.0)
Monocytes Absolute: 0.2 10*3/uL (ref 0.1–1.0)
Monocytes Relative: 8 %
Neutro Abs: 1.4 10*3/uL — ABNORMAL LOW (ref 1.7–7.7)
Neutrophils Relative %: 48 %
Platelets: 163 10*3/uL (ref 150–400)
RBC: 5.16 MIL/uL (ref 4.22–5.81)
RDW: 13 % (ref 11.5–15.5)
WBC: 2.9 10*3/uL — ABNORMAL LOW (ref 4.0–10.5)
nRBC: 0 % (ref 0.0–0.2)

## 2022-07-04 LAB — COMPREHENSIVE METABOLIC PANEL
ALT: 104 U/L — ABNORMAL HIGH (ref 0–44)
AST: 50 U/L — ABNORMAL HIGH (ref 15–41)
Albumin: 4.6 g/dL (ref 3.5–5.0)
Alkaline Phosphatase: 88 U/L (ref 38–126)
Anion gap: 9 (ref 5–15)
BUN: 14 mg/dL (ref 6–20)
CO2: 26 mmol/L (ref 22–32)
Calcium: 9.9 mg/dL (ref 8.9–10.3)
Chloride: 102 mmol/L (ref 98–111)
Creatinine, Ser: 1.16 mg/dL (ref 0.61–1.24)
GFR, Estimated: >60 mL/min (ref >60)
Glucose, Bld: 83 mg/dL (ref 70–99)
Potassium: 4.1 mmol/L (ref 3.5–5.1)
Sodium: 137 mmol/L (ref 135–145)
Total Bilirubin: 0.8 mg/dL (ref 0.3–1.2)
Total Protein: 8 g/dL (ref 6.5–8.1)

## 2022-07-04 NOTE — Progress Notes (Signed)
Pt states he was let go from his job last month and now his insurance has ran out due to no job. Pt has not had his oral chemo in almost a month and states he is not able to afford it. States he is planning on going to social services for emergency medicare but not sure if he will be approved in time states he is a little stressed because he knows he does need his oral chemo. Pt believes the glipizide is causing migraines on rt side of his head.

## 2022-07-04 NOTE — Telephone Encounter (Signed)
Pt has been referred to [facility] in [facility] and has been scheduled for 07/12/22 at 3pm. Pt agrees and understands appt.

## 2022-07-04 NOTE — Telephone Encounter (Signed)
Oral Oncology Patient Advocate Encounter  Reached out and spoke with patient regarding PAP paperwork, explained that I would send it to their preferred email via [employer].   Confirmed email address: [email or web address].    Patient expressed understanding and consent.  Will follow up once paperwork has been signed and returned.   Berdine Addison, [facility] Patient [place]  [phone] (phone) [phone] (fax) 07/04/2022 3:56 PM

## 2022-07-04 NOTE — Telephone Encounter (Signed)
Reached out to [facility] regarding pts referral for recurrent HA's/Migraines. Per [place] she did not see a referral in their system regarding pt. However, she did advise to resend referral but she will go ahead and scehdule pt to see Luella Cook next Thursday 07/12/22 at 3pm. Re-faxed referral and receieved fax confirmation for [phone] and ATTN: [place]. Reahced out to pt and made him aware of appt on 07/12/2022 at 3pm and pt agrees and understands.

## 2022-07-04 NOTE — Telephone Encounter (Signed)
Oral Oncology Patient Advocate Encounter   Began application for assistance for Onureg through [employer].   Application will be submitted upon completion of necessary supporting documentation.   [employer]' phone number [phone].   I will continue to check the status until final determination.   Berdine Addison, [facility] Patient [place]  [phone] (phone) [phone] (fax) 07/04/2022 3:54 PM

## 2022-07-05 NOTE — Telephone Encounter (Signed)
Oral Oncology Patient Advocate Encounter   Submitted application for assistance for Onureg to [employer].   Application submitted via e-fax to [phone]   [employer]MS' phone number [phone].   I will continue to check the status until final determination.   Berdine Addison, [facility] Patient [place]  [phone] (phone) [phone] (fax) 07/05/2022 9:22 AM

## 2022-07-05 NOTE — Telephone Encounter (Signed)
Patient coming in to sign application. [employer] was erroring out.

## 2022-07-05 NOTE — Telephone Encounter (Signed)
I had called yest. To see what the cost would be for the pt. Because he just lost insurance. Spoke to scheduler for neurology and for no insurance it is 200 dollars for first appt. And if they any future appts after the first one the charge in 100 dollars. I called the pt. And told him this above and he said he could do if the office has appt in nex 3 weeks. They have him appt next week

## 2022-07-08 ENCOUNTER — Encounter: Payer: Self-pay | Admitting: Oncology

## 2022-07-08 ENCOUNTER — Encounter: Payer: Self-pay | Admitting: Hematology and Oncology

## 2022-07-08 NOTE — Progress Notes (Signed)
Hematology/Oncology Consult note [facility]  [phone] [phone]  Patient Care Team: Theresia Lo, NP as PCP - General (Nurse Practitioner) [name], MD as Consulting Physician (Oncology)   Name of the patient: Carl [name]  [id number]  1975-11-25   Date of visit: 07/08/22  Diagnosis- history of AML in remission   Chief complaint/ Reason for visit-in follow-up of AML on oral Vidaza  Heme/Onc history: Carl [name]. is a 46 y.o. 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 Gene Coding Predicted Protein Variant allele fraction  U2AF1 c.101C>T p.(Ser34Phe) 12.9 %  RUNX1 c.620_621insATCCCCCG p.(Gln208Serfs*6) 6.7 %  NRAS c.38G>A p.(Gly13Asp) 9.2 %   Variants of Unknown Clinical Significance:  Gene Coding Predicted Protein Variant allele fraction  TET2 c.4493G>A p.(Arg1498His) 48.2 %  ETV6 c.776G>A p.(Arg259Gln) 49.1 %   Pertinent Phenotypic data: blasts express CD7, CD13, CD34, CD38, CD71, CD117, and HLA-DR.   He received cytarabine and daunorubicin (7+3) beginning 04/13/2020 and 05/27/2020.  Course has been complicated by fever and neutropenia with mucositis (04/27/2020) and gluteal cleft cellulitis (05/06/2020).Received induction with 7+3+HD with delayed count recovery. D42 BMBx revealed persistent disease and patient re-induced with 7+3.  He then went on to receive consolidation therapy with HiDAC and cycle 4 received on 11/04/2020.  He follows up with Dr. Royce Macadamia at [facility]. Last bone marrow biopsy on 12/05/2020 which showed hypercellular bone marrow with trilineage hematopoiesis and 3% blasts by manual differential.   He is presently on azacitidine p.o. 300 mg daily 2 weeks on 2 weeks off.      Interval history-patient reports on and off headaches which have particularly worsened in the last 1 month.  He had fewer episodes which were self-limited prior to that.  Denies any blurry vision presently.   Denies any focal numbness or weakness.  ECOG PS- 1 Pain scale- 0   Review of systems- Review of Systems  Constitutional:  Negative for chills, fever, malaise/fatigue and weight loss.  HENT:  Negative for congestion, ear discharge and nosebleeds.   Eyes:  Negative for blurred vision.  Respiratory:  Negative for cough, hemoptysis, sputum production, shortness of breath and wheezing.   Cardiovascular:  Negative for chest pain, palpitations, orthopnea and claudication.  Gastrointestinal:  Negative for abdominal pain, blood in stool, constipation, diarrhea, heartburn, melena, nausea and vomiting.  Genitourinary:  Negative for dysuria, flank pain, frequency, hematuria and urgency.  Musculoskeletal:  Negative for back pain, joint pain and myalgias.  Skin:  Negative for rash.  Neurological:  Positive for headaches. Negative for dizziness, tingling, focal weakness, seizures and weakness.  Endo/Heme/Allergies:  Does not bruise/bleed easily.  Psychiatric/Behavioral:  Negative for depression and suicidal ideas. The patient does not have insomnia.       Allergies  Allergen Reactions   Dapsone     Contraindication - G6PD deficient   Primaquine     Contraindication - G6PD deficient   Rasburicase     Contraindication - G6PD deficient     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[place])    Agranulocytosis secondary to cancer chemotherapy (CODE) ([place])    Chemotherapy induced neutropenia (HCC)    Pancreatitis    Pancytopenia (HCC)    Thrombocytopenia (HCC)    Tobacco use disorder      No past surgical history on file.  Social History   Socioeconomic History   Marital status: Single    Spouse name: Not on file   Number  of children: Not on file   Years of education: Not on file   Highest education level: Not on file  Occupational History   Not on file  Tobacco Use   Smoking status: Former    Packs/day: 1.00    Years: 17.00    Total pack years:  17.00    Types: Cigarettes    Quit date: 03/27/2020    Years since quitting: 2.2   Smokeless tobacco: Never  Vaping Use   Vaping Use: Never used  Substance and Sexual Activity   Alcohol use: Yes    Comment: Socially   Drug use: Not Currently   Sexual activity: Yes  Other Topics Concern   Not on file  Social History Narrative   Not on file   Social Determinants of Health   Financial Resource Strain: Not on file  Food Insecurity: Not on file  Transportation Needs: No Transportation Needs (02/05/2022)   PRAPARE - Hydrologist (Medical): No    Lack of Transportation (Non-Medical): No  Physical Activity: Not on file  Stress: Not on file  Social Connections: Not on file  Intimate Partner Violence: Not on file    Family History  Problem Relation Age of Onset   Hypertension Mother    Diabetes Mother    Diabetes Father    Prostate cancer Neg Hx    Kidney cancer Neg Hx    Bladder Cancer Neg Hx      Current Outpatient Medications:    glipiZIDE (GLUCOTROL) 5 MG tablet, Take 1 tablet (5 mg total) by mouth daily., Disp: 90 tablet, Rfl: 1   miconazole (MICOTIN) 2 % cream, Apply 1 Application topically 2 (two) times daily., Disp: 28.35 g, Rfl: 0   prochlorperazine (COMPAZINE) 10 MG tablet, Take 1 tablet (10 mg total) by mouth every 6 (six) hours as needed for nausea or vomiting., Disp: 20 tablet, Rfl: 0   valACYclovir (VALTREX) 500 MG tablet, Take 500 mg by mouth daily., Disp: , Rfl:    azaCITIDine 300 MG TABS, Take 300 mg by mouth daily. Take for 14 days, hold for 14 days. Repeat every 28 days. (Patient not taking: Reported on 07/04/2022), Disp: 14 tablet, Rfl: 6  Physical exam:  Vitals:   07/04/22 1319  BP: 126/78  Pulse: 78  Resp: 18  Temp: (!) 96.6 F (35.9 C)  SpO2: 100%  Weight: 212 lb 12.8 oz (96.5 kg)   Physical Exam Constitutional:      General: He is not in acute distress. Cardiovascular:     Rate and Rhythm: Normal rate and regular  rhythm.     Heart sounds: Normal heart sounds.  Pulmonary:     Effort: Pulmonary effort is normal.     Breath sounds: Normal breath sounds.  Abdominal:     General: Bowel sounds are normal.     Palpations: Abdomen is soft.  Skin:    General: Skin is warm and dry.  Neurological:     Mental Status: He is alert and oriented to person, place, and time.     Motor: No weakness.         Latest Ref Rng & Units 07/04/2022   12:59 PM  CMP  Glucose 70 - 99 mg/dL 83   BUN 6 - 20 mg/dL 14   Creatinine 0.61 - 1.24 mg/dL 1.16   Sodium 135 - 145 mmol/L 137   Potassium 3.5 - 5.1 mmol/L 4.1   Chloride 98 - 111 mmol/L 102  CO2 22 - 32 mmol/L 26   Calcium 8.9 - 10.3 mg/dL 9.9   Total Protein 6.5 - 8.1 g/dL 8.0   Total Bilirubin 0.3 - 1.2 mg/dL 0.8   Alkaline Phos 38 - 126 U/L 88   AST 15 - 41 U/L 50   ALT 0 - 44 U/L 104       Latest Ref Rng & Units 07/04/2022   12:59 PM  CBC  WBC 4.0 - 10.5 K/uL 2.9   Hemoglobin 13.0 - 17.0 g/dL 16.0   Hematocrit 39.0 - 52.0 % 47.8   Platelets 150 - 400 K/uL 163     No images are attached to the encounter.  No results found.   Assessment and plan- Patient is a 46 y.o. male with history of AML in remission currently on oral Vidaza here for routine follow-up  Patient has been on oral Vidaza for his AMLOver a year now.  In terms of his CBC it has remained more or less stable with mild neutropenia and thrombocytopenia but no worsening cytopenias suggestive of AML recurrence.  He also has mild abnormal LFTs which has been stable for over a year now.  He will continue oral Vidaza 2 weeks on and 2 weeks off as recommended by [facility].  Headaches: Etiology unclear.  MRI brain with and without contrast did not show any acute pathology her findings suggestive of leptomeningeal disease.  I am referring him to neurology for further management.  There is a small possibility if he is having an extramedullary isolated CNS relapse but the fact that his MRI brain also  checked out normal makes this possibility less likely.  Cause of his headaches remain uncertain despite neurology evaluation I will consider doing a lumbar puncture at that point.  I will continue to monitor his CBC every month and see him back in 3 months.  He will call us sooner if his symptoms worsen   Visit Diagnosis 1. AML (acute myeloid leukemia) in remission [facility])      Dr. Randa Evens, MD, MPH [facility] at [facility] [phone] 07/08/2022 2:36 PM

## 2022-07-09 ENCOUNTER — Telehealth: Payer: Self-pay | Admitting: *Deleted

## 2022-07-09 ENCOUNTER — Telehealth: Payer: Self-pay

## 2022-07-09 NOTE — Telephone Encounter (Signed)
Patient called requesting a call back from the care team regarding his appointments and "cancer medication".

## 2022-07-09 NOTE — Telephone Encounter (Signed)
Received notification that application was received and in processing for [employer]PA[employer] to receive medication at no cost. Will continue to follow and update.   Berdine Addison, [facility] Patient [place]  [phone] (phone) [phone] (fax) 07/09/2022 11:31 AM

## 2022-07-09 NOTE — Telephone Encounter (Signed)
[employer] is requesting proof of income. Contacting patient to get required documents.    Berdine Addison, [facility] Patient [place]  [phone] (phone) [phone] (fax) 07/09/2022 2:51 PM

## 2022-07-09 NOTE — Telephone Encounter (Signed)
Faxed over requested information to [employer] regarding disability over to [phone] and recieved a fax confirmation.

## 2022-07-10 ENCOUNTER — Encounter: Payer: Self-pay | Admitting: Hematology and Oncology

## 2022-07-10 NOTE — Telephone Encounter (Signed)
Faxed over copy of Disability Letter to [employer]PA[employer] to show no income or employment.   Carl [name], [facility] Patient [place]  [phone] (phone) [phone] (fax) 07/10/2022 1:15 PM

## 2022-07-13 ENCOUNTER — Other Ambulatory Visit: Payer: Self-pay | Admitting: *Deleted

## 2022-07-13 ENCOUNTER — Ambulatory Visit (INDEPENDENT_AMBULATORY_CARE_PROVIDER_SITE_OTHER): Payer: Medicaid Other | Admitting: Nurse Practitioner

## 2022-07-13 ENCOUNTER — Other Ambulatory Visit: Payer: Self-pay

## 2022-07-13 ENCOUNTER — Other Ambulatory Visit (HOSPITAL_COMMUNITY): Payer: Self-pay

## 2022-07-13 VITALS — BP 136/87 | HR 71 | Ht 69.0 in | Wt 215.8 lb

## 2022-07-13 DIAGNOSIS — C9201 Acute myeloblastic leukemia, in remission: Secondary | ICD-10-CM

## 2022-07-13 DIAGNOSIS — M549 Dorsalgia, unspecified: Secondary | ICD-10-CM

## 2022-07-13 DIAGNOSIS — R739 Hyperglycemia, unspecified: Secondary | ICD-10-CM

## 2022-07-13 LAB — GLUCOSE, POCT (MANUAL RESULT ENTRY): POC Glucose: 104 mg/dl — AB (ref 70–99)

## 2022-07-13 MED ORDER — AZACITIDINE 300 MG PO TABS: 300.0000 mg | ORAL_TABLET | Freq: Every day | ORAL | 0 refills | Status: DC

## 2022-07-13 MED ORDER — AZACITIDINE 300 MG PO TABS: 300.0000 mg | ORAL_TABLET | Freq: Every day | ORAL | 6 refills | Status: DC

## 2022-07-13 MED ORDER — DICLOFENAC SODIUM 1 % EX GEL: 2.0000 g | Freq: Four times a day (QID) | CUTANEOUS | 1 refills | Status: DC

## 2022-07-13 NOTE — Assessment & Plan Note (Signed)
started him on voltern gel. Advised to use hot and cold compress alternatively.

## 2022-07-13 NOTE — Progress Notes (Unsigned)
Established Patient Office Visit  Subjective:  Patient ID: Carl Sipp., male    DOB: 03-30-1976  Age: 46 y.o. MRN: [id number]  CC: No chief complaint on file.    HPI  Carl [name]. presents for:  Back Pain This is a new problem. The current episode started in the past 7 days. The problem occurs intermittently. The problem is unchanged. Quality: throbbing pain. The pain is at a severity of 3/10. The pain is The same all the time. The symptoms are aggravated by lying down. Stiffness is present All day. Pertinent negatives include no abdominal pain, chest pain, dysuria, headaches or pelvic pain. Risk factors include history of cancer. He has tried analgesics for the symptoms. The treatment provided mild relief.     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[place])    Agranulocytosis secondary to cancer chemotherapy (CODE) ([place])    Chemotherapy induced neutropenia (HCC)    Pancreatitis    Pancytopenia (HCC)    Thrombocytopenia (HCC)    Tobacco use disorder     No past surgical history on file.  Family History  Problem Relation Age of Onset   Hypertension Mother    Diabetes Mother    Diabetes Father    Prostate cancer Neg Hx    Kidney cancer Neg Hx    Bladder Cancer Neg Hx     Social History   Socioeconomic History   Marital status: Single    Spouse name: Not on file   Number of children: Not on file   Years of education: Not on file   Highest education level: Not on file  Occupational History   Not on file  Tobacco Use   Smoking status: Former    Packs/day: 1.00    Years: 17.00    Total pack years: 17.00    Types: Cigarettes    Quit date: 03/27/2020    Years since quitting: 2.3   Smokeless tobacco: Never  Vaping Use   Vaping Use: Never used  Substance and Sexual Activity   Alcohol use: Yes    Comment: Socially   Drug use: Not Currently   Sexual activity: Yes  Other Topics Concern   Not on file  Social History  Narrative   Not on file   Social Determinants of Health   Financial Resource Strain: Not on file  Food Insecurity: Not on file  Transportation Needs: No Transportation Needs (02/05/2022)   PRAPARE - Hydrologist (Medical): No    Lack of Transportation (Non-Medical): No  Physical Activity: Not on file  Stress: Not on file  Social Connections: Not on file  Intimate Partner Violence: Not on file     Outpatient Medications Prior to Visit  Medication Sig Dispense Refill   glipiZIDE (GLUCOTROL) 5 MG tablet Take 1 tablet (5 mg total) by mouth daily. 90 tablet 1   miconazole (MICOTIN) 2 % cream Apply 1 Application topically 2 (two) times daily. 28.35 g 0   prochlorperazine (COMPAZINE) 10 MG tablet Take 1 tablet (10 mg total) by mouth every 6 (six) hours as needed for nausea or vomiting. 20 tablet 0   valACYclovir (VALTREX) 500 MG tablet Take 500 mg by mouth daily.     azaCITIDine 300 MG TABS Take 300 mg by mouth daily. Take for 14 days, hold for 14 days. Repeat every 28 days. (Patient not taking: Reported on 07/04/2022) 14 tablet 6   No facility-administered medications prior  to visit.    Allergies  Allergen Reactions   Dapsone     Contraindication - G6PD deficient   Primaquine     Contraindication - G6PD deficient   Rasburicase     Contraindication - G6PD deficient    ROS Review of Systems  Cardiovascular:  Negative for chest pain.  Gastrointestinal:  Negative for abdominal pain.  Genitourinary:  Negative for dysuria and pelvic pain.  Musculoskeletal:  Positive for back pain (left beneth the shoulder blade).  Neurological:  Negative for dizziness, facial asymmetry and headaches.  Psychiatric/Behavioral: Negative.        Objective:    Physical Exam Constitutional:      Appearance: Normal appearance. He is obese.  HENT:     Right Ear: Tympanic membrane normal.     Left Ear: Tympanic membrane normal.     Mouth/Throat:     Mouth: Mucous  membranes are moist.  Eyes:     Conjunctiva/sclera: Conjunctivae normal.     Pupils: Pupils are equal, round, and reactive to light.  Cardiovascular:     Rate and Rhythm: Normal rate and regular rhythm.     Pulses: Normal pulses.     Heart sounds: Normal heart sounds.  Pulmonary:     Effort: Pulmonary effort is normal.  Abdominal:     Palpations: There is no mass.     Tenderness: There is no abdominal tenderness. There is no right CVA tenderness or left CVA tenderness.  Musculoskeletal:        General: Normal range of motion.  Skin:    Coloration: Skin is not jaundiced.     Findings: No erythema.  Neurological:     General: No focal deficit present.     Mental Status: He is alert and oriented to person, place, and time. Mental status is at baseline.  Psychiatric:        Mood and Affect: Mood normal.        Behavior: Behavior normal.        Thought Content: Thought content normal.        Judgment: Judgment normal.     BP 136/87   Pulse 71   Ht '5\' 9"'$  (1.753 m)   Wt 215 lb 12.8 oz (97.9 kg)   BMI 31.87 kg/m  Wt Readings from Last 3 Encounters:  07/13/22 215 lb 12.8 oz (97.9 kg)  07/04/22 212 lb 12.8 oz (96.5 kg)  05/18/22 205 lb 9 oz (93.2 kg)     Health Maintenance  Topic Date Due   COLONOSCOPY (Pts 45-33yr Insurance coverage will need to be confirmed)  Never done   COVID-19 Vaccine (3 - Pfizer risk series) 11/07/2020   INFLUENZA VACCINE  03/27/2022   Hepatitis C Screening  Completed   HIV Screening  Completed   HPV VACCINES  Aged Out    There are no preventive care reminders to display for this patient.  Lab Results  Component Value Date   TSH 0.957 04/19/2022   Lab Results  Component Value Date   WBC 2.9 (L) 07/04/2022   HGB 16.0 07/04/2022   HCT 47.8 07/04/2022   MCV 92.6 07/04/2022   PLT 163 07/04/2022   Lab Results  Component Value Date   NA 137 07/04/2022   K 4.1 07/04/2022   CO2 26 07/04/2022   GLUCOSE 83 07/04/2022   BUN 14 07/04/2022    CREATININE 1.16 07/04/2022   BILITOT 0.8 07/04/2022   ALKPHOS 88 07/04/2022   AST 50 (H) 07/04/2022  ALT 104 (H) 07/04/2022   PROT 8.0 07/04/2022   ALBUMIN 4.6 07/04/2022   CALCIUM 9.9 07/04/2022   ANIONGAP 9 07/04/2022   Lab Results  Component Value Date   CHOL 153 12/10/2019   Lab Results  Component Value Date   HDL 71 12/10/2019   Lab Results  Component Value Date   LDLCALC 73 12/10/2019   Lab Results  Component Value Date   TRIG 33 12/10/2019   Lab Results  Component Value Date   CHOLHDL 2.2 12/10/2019   Lab Results  Component Value Date   HGBA1C 5.2 05/18/2022      Assessment & Plan:   Problem List Items Addressed This Visit       Other   Notalgia    Patient complaint of back pain without CVA tenderness. Started him on voltern gel. Advised to use hot and cold compress alternatively.       Hyperglycemia - Primary    His blood glucose 104 in the office today. His hemoglobin A1c on 05/18/2022 was 5.2. Advised patient to take glipizide 5 mg daily. We will continue to monitor.      Relevant Orders   POCT glucose (manual entry) (Completed)     Meds ordered this encounter  Medications   diclofenac Sodium (VOLTAREN) 1 % GEL    Sig: Apply 2 g topically 4 (four) times daily.    Dispense:  100 g    Refill:  1     Follow-up: No follow-ups on file.    Theresia Lo, NP

## 2022-07-15 ENCOUNTER — Encounter: Payer: Self-pay | Admitting: Nurse Practitioner

## 2022-07-15 NOTE — Assessment & Plan Note (Signed)
His blood glucose 104 in the office today. His hemoglobin A1c on 05/18/2022 was 5.2. Advised patient to take glipizide 5 mg daily. We will continue to monitor.

## 2022-07-16 NOTE — Telephone Encounter (Signed)
Called to check status. BMSPAF received script, but still awaiting tax forms from patient. I will contact the patient to request those again.   BMSPAF's phone number: [phone] BMSPA's fax number: [phone]  Carl [name], [name]  [phone] (phone) [phone] (fax) 07/16/2022 8:28 AM

## 2022-07-16 NOTE — Telephone Encounter (Signed)
Oral Oncology Patient Advocate Encounter   Received notification that the application for assistance for [place] through [employer] has been approved.   [employer]' phone number [phone].   Effective dates: 07/16/22 through 07/16/23  I have spoken to the patient.  Berdine Addison, [facility] Patient [place]  [phone] (phone) [phone] (fax) 07/16/2022 11:19 AM

## 2022-07-16 NOTE — Telephone Encounter (Signed)
Spoke to patient regarding Proof of Income and called [employer] back to let them know he did not file taxes for 2022. They accepted his monthly disability income and pushed his application through processing. Will let us know outcome by end of day today (11.20) or tomorrow (11.21). I will continue to follow and monitor.  Berdine Addison, [facility] Patient [place]  [phone] (phone) [phone] (fax) 07/16/2022 10:02 AM

## 2022-07-23 ENCOUNTER — Encounter: Payer: Self-pay | Admitting: Hematology and Oncology

## 2022-07-23 ENCOUNTER — Inpatient Hospital Stay
Admission: EM | Admit: 2022-07-23 | Discharge: 2022-07-24 | DRG: 369 | Disposition: A | Discharge status: Home / Self Care | Payer: Medicaid Other | Attending: Hospitalist | Admitting: Hospitalist

## 2022-07-23 ENCOUNTER — Inpatient Hospital Stay: Payer: Medicaid Other

## 2022-07-23 ENCOUNTER — Telehealth: Payer: Self-pay | Admitting: *Deleted

## 2022-07-23 ENCOUNTER — Telehealth: Payer: Self-pay | Admitting: Oncology

## 2022-07-23 ENCOUNTER — Other Ambulatory Visit: Payer: Self-pay

## 2022-07-23 DIAGNOSIS — Z87891 Personal history of nicotine dependence: Secondary | ICD-10-CM | POA: Diagnosis not present

## 2022-07-23 DIAGNOSIS — C9201 Acute myeloblastic leukemia, in remission: Secondary | ICD-10-CM | POA: Diagnosis present

## 2022-07-23 DIAGNOSIS — K226 Gastro-esophageal laceration-hemorrhage syndrome: Secondary | ICD-10-CM | POA: Diagnosis present

## 2022-07-23 DIAGNOSIS — Z8249 Family history of ischemic heart disease and other diseases of the circulatory system: Secondary | ICD-10-CM

## 2022-07-23 DIAGNOSIS — R739 Hyperglycemia, unspecified: Secondary | ICD-10-CM | POA: Diagnosis present

## 2022-07-23 DIAGNOSIS — Z833 Family history of diabetes mellitus: Secondary | ICD-10-CM | POA: Diagnosis not present

## 2022-07-23 DIAGNOSIS — Y900 Blood alcohol level of less than 20 mg/100 ml: Secondary | ICD-10-CM | POA: Diagnosis present

## 2022-07-23 DIAGNOSIS — K920 Hematemesis: Secondary | ICD-10-CM | POA: Diagnosis present

## 2022-07-23 DIAGNOSIS — F1010 Alcohol abuse, uncomplicated: Secondary | ICD-10-CM | POA: Diagnosis present

## 2022-07-23 DIAGNOSIS — D75A Glucose-6-phosphate dehydrogenase (G6PD) deficiency without anemia: Secondary | ICD-10-CM | POA: Diagnosis present

## 2022-07-23 DIAGNOSIS — Z79899 Other long term (current) drug therapy: Secondary | ICD-10-CM

## 2022-07-23 DIAGNOSIS — Z888 Allergy status to other drugs, medicaments and biological substances status: Secondary | ICD-10-CM | POA: Diagnosis not present

## 2022-07-23 DIAGNOSIS — Z9221 Personal history of antineoplastic chemotherapy: Secondary | ICD-10-CM | POA: Diagnosis not present

## 2022-07-23 DIAGNOSIS — N179 Acute kidney failure, unspecified: Secondary | ICD-10-CM | POA: Diagnosis present

## 2022-07-23 DIAGNOSIS — F17200 Nicotine dependence, unspecified, uncomplicated: Secondary | ICD-10-CM | POA: Diagnosis present

## 2022-07-23 DIAGNOSIS — Z95828 Presence of other vascular implants and grafts: Secondary | ICD-10-CM

## 2022-07-23 DIAGNOSIS — Z7141 Alcohol abuse counseling and surveillance of alcoholic: Secondary | ICD-10-CM | POA: Insufficient documentation

## 2022-07-23 LAB — CBC WITH DIFFERENTIAL/PLATELET
Abs Immature Granulocytes: 0 10*3/uL (ref 0.00–0.07)
Basophils Absolute: 0 10*3/uL (ref 0.0–0.1)
Basophils Relative: 1 %
Eosinophils Absolute: 0 10*3/uL (ref 0.0–0.5)
Eosinophils Relative: 1 %
HCT: 44.5 % (ref 39.0–52.0)
Hemoglobin: 14.9 g/dL (ref 13.0–17.0)
Immature Granulocytes: 0 %
Lymphocytes Relative: 20 %
Lymphs Abs: 0.8 10*3/uL (ref 0.7–4.0)
MCH: 30.4 pg (ref 26.0–34.0)
MCHC: 33.5 g/dL (ref 30.0–36.0)
MCV: 90.8 fL (ref 80.0–100.0)
Monocytes Absolute: 0.3 10*3/uL (ref 0.1–1.0)
Monocytes Relative: 7 %
Neutro Abs: 2.9 10*3/uL (ref 1.7–7.7)
Neutrophils Relative %: 71 %
Platelets: 206 10*3/uL (ref 150–400)
RBC: 4.9 MIL/uL (ref 4.22–5.81)
RDW: 12.9 % (ref 11.5–15.5)
WBC: 4.1 10*3/uL (ref 4.0–10.5)
nRBC: 0 % (ref 0.0–0.2)

## 2022-07-23 LAB — COMPREHENSIVE METABOLIC PANEL
ALT: 46 U/L — ABNORMAL HIGH (ref 0–44)
AST: 31 U/L (ref 15–41)
Albumin: 4.5 g/dL (ref 3.5–5.0)
Alkaline Phosphatase: 86 U/L (ref 38–126)
Anion gap: 10 (ref 5–15)
BUN: 16 mg/dL (ref 6–20)
CO2: 25 mmol/L (ref 22–32)
Calcium: 9.6 mg/dL (ref 8.9–10.3)
Chloride: 106 mmol/L (ref 98–111)
Creatinine, Ser: 1.75 mg/dL — ABNORMAL HIGH (ref 0.61–1.24)
GFR, Estimated: 48 mL/min — ABNORMAL LOW (ref >60)
Glucose, Bld: 124 mg/dL — ABNORMAL HIGH (ref 70–99)
Potassium: 4.5 mmol/L (ref 3.5–5.1)
Sodium: 141 mmol/L (ref 135–145)
Total Bilirubin: 0.8 mg/dL (ref 0.3–1.2)
Total Protein: 7.8 g/dL (ref 6.5–8.1)

## 2022-07-23 LAB — ETHANOL: Alcohol, Ethyl (B): <10 mg/dL (ref <10)

## 2022-07-23 LAB — MAGNESIUM
Magnesium: 1.8 mg/dL (ref 1.7–2.4)
Magnesium: 1.7 mg/dL (ref 1.7–2.4)

## 2022-07-23 LAB — PROTIME-INR
INR: 1 (ref 0.8–1.2)
Prothrombin Time: 13.3 seconds (ref 11.4–15.2)

## 2022-07-23 LAB — CBG MONITORING, ED
Glucose-Capillary: 97 mg/dL (ref 70–99)
Glucose-Capillary: 79 mg/dL (ref 70–99)

## 2022-07-23 LAB — PHOSPHORUS: Phosphorus: 2.9 mg/dL (ref 2.5–4.6)

## 2022-07-23 MED ORDER — LORAZEPAM 2 MG/ML IJ SOLN: 2.0000 mg | INTRAMUSCULAR | Status: DC | PRN

## 2022-07-23 MED ORDER — AZACITIDINE 300 MG PO TABS: 300.0000 mg | ORAL_TABLET | Freq: Every day | ORAL | Status: DC

## 2022-07-23 MED ORDER — INSULIN ASPART 100 UNIT/ML IJ SOLN: 0.0000 [IU] | Freq: Every day | INTRAMUSCULAR | Status: DC

## 2022-07-23 MED ORDER — NICOTINE 14 MG/24HR TD PT24: 14.0000 mg | MEDICATED_PATCH | Freq: Every day | TRANSDERMAL | Status: DC | PRN

## 2022-07-23 MED ORDER — ACETAMINOPHEN 650 MG RE SUPP: 650.0000 mg | Freq: Four times a day (QID) | RECTAL | Status: DC | PRN

## 2022-07-23 MED ORDER — ONDANSETRON HCL 4 MG/2ML IJ SOLN: 4.0000 mg | Freq: Four times a day (QID) | INTRAMUSCULAR | Status: DC | PRN

## 2022-07-23 MED ORDER — PANTOPRAZOLE SODIUM 40 MG IV SOLR
40.0000 mg | Freq: Two times a day (BID) | INTRAVENOUS | Status: DC
  Administered 2022-07-23: 40 mg via INTRAVENOUS
  Filled 2022-07-23: qty 10

## 2022-07-23 MED ORDER — PANTOPRAZOLE SODIUM 40 MG IV SOLR
40.0000 mg | Freq: Once | INTRAVENOUS | Status: AC
  Administered 2022-07-23: 40 mg via INTRAVENOUS
  Filled 2022-07-23: qty 10

## 2022-07-23 MED ORDER — SODIUM CHLORIDE 0.9 % IV SOLN
INTRAVENOUS | Status: AC
  Administered 2022-07-23: 999 mL via INTRAVENOUS

## 2022-07-23 MED ORDER — INSULIN ASPART 100 UNIT/ML IJ SOLN: 0.0000 [IU] | Freq: Three times a day (TID) | INTRAMUSCULAR | Status: DC

## 2022-07-23 MED ORDER — ONDANSETRON HCL 4 MG/2ML IJ SOLN
4.0000 mg | Freq: Once | INTRAMUSCULAR | Status: AC
  Administered 2022-07-23: 4 mg via INTRAVENOUS
  Filled 2022-07-23: qty 2

## 2022-07-23 MED ORDER — ACETAMINOPHEN 325 MG PO TABS: 650.0000 mg | ORAL_TABLET | Freq: Four times a day (QID) | ORAL | Status: DC | PRN

## 2022-07-23 MED ORDER — ONDANSETRON HCL 4 MG PO TABS: 4.0000 mg | ORAL_TABLET | Freq: Four times a day (QID) | ORAL | Status: DC | PRN

## 2022-07-23 MED ORDER — HYDRALAZINE HCL 20 MG/ML IJ SOLN: 5.0000 mg | Freq: Three times a day (TID) | INTRAMUSCULAR | Status: DC | PRN

## 2022-07-23 MED ORDER — VALACYCLOVIR HCL 500 MG PO TABS: 500.0000 mg | ORAL_TABLET | Freq: Every day | ORAL | Status: DC

## 2022-07-23 MED ORDER — LACTATED RINGERS IV BOLUS
1000.0000 mL | Freq: Once | INTRAVENOUS | Status: AC
  Administered 2022-07-23: 1000 mL via INTRAVENOUS

## 2022-07-23 NOTE — H&P (Addendum)
History and Physical   Carl [name]. KHV:[id number] DOB: 1976-07-17 DOA: 07/23/2022  PCP: Theresia Lo, NP  Outpatient Specialists: Dr. Janese Banks, medical oncology Patient coming from: Home  I have personally briefly reviewed patient's old medical records in [place].  Chief Concern: Hematemesis  HPI: [name], [name]. 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  Initial vitals in the emergency department show temperature of 98.6, respiration rate of 20, heart rate of 85, blood pressure 135/80, SpO2 100% on room air.  Serum sodium is 141, potassium 4.5, chloride 106, bicarb 25, BUN of 16, serum creatinine of 1.75, nonfasting blood glucose 124, EGFR 48, WBC 4.1, hemoglobin 14.9, platelets of 206.  ED treatment: Ondansetron 4 mg IV, Protonix 40 mg IV, LR 1 L bolus ---------------------------- At bedside, he is able to tell me his name, age, current year, and he knows he is in the hospital.  He reports two episodes of hematemesis, with frank blood and black clots.  He reports he had a similar episode about 1 month ago.  He reports 2 episodes today had large volume and significant amount of blood and was concerning to his outpatient provider and they have advised him to present to the emergency department for further evaluation.  He drinks 1 case of 12 oz cans of beers, 18 bottles. He drank 24 packs of beer last weekend.  He denies history of seizures and or tremors or withdrawals during the week.  He reports his last drink was on Sunday, 07/22/2022.  He denies chest pain, shortness of breath, abdominal pain, dysuria, cough, fever, hematuria, diarrhea, melena stool.  He reports he does not check his stool regularly but thinks it is [place].  I have counseled him on following up with his stool in the  next few days to ensure it is not black.  He is advised to report any black stool or coffee-ground emesis to nursing if he has any of these signs.  Social history: He lives with by himself. He denies tobacco. He endorses weekly etoh use.   ROS: Constitutional: no weight change, no fever ENT/Mouth: no sore throat, no rhinorrhea Eyes: no eye pain, no vision changes Cardiovascular: no chest pain, no dyspnea,  no edema, no palpitations Respiratory: no cough, no sputum, no wheezing Gastrointestinal: no nausea, + vomiting, no diarrhea, no constipation Genitourinary: no urinary incontinence, no dysuria, no hematuria Musculoskeletal: no arthralgias, no myalgias Skin: no skin lesions, no pruritus, Neuro: no weakness, no loss of consciousness, no syncope Psych: no anxiety, no depression, no decrease appetite Heme/Lymph: no bruising, no bleeding  ED Course: Discussed with emergency medicine provider, patient requiring hospitalization for chief concerns of hematemesis.  Assessment/Plan  Principal Problem:   Hematemesis Active Problems:   AKI (acute kidney injury) ([place])   Acute myeloid leukemia in remission ([place])   Port-A-Cath in place   G6PD deficiency   Tobacco use disorder   Hyperglycemia   Encounter for alcohol cessation counseling   Assessment and Plan:  * Hematemesis Versus hemoptysis - Check ethanol level - If patient continues to have symptoms and CBC downtrends, a.m. team to consider pulmonology consultation for bronchoscopy versus GI for endoscopy - Protonix 40 mg IV twice daily, patient has negative history for variceal bleed, gastric ulcer - Clear liquids at this time - Stat portable chest x-ray ordered - Gastroenterology has been consulted via  secure chat and Epic order and is aware - Gastroenterology specialist, Dr. Allen Norris recommends a CTA chest abdomen pelvis, this has not been ordered on admission due to acute kidney injury, a.m. team to order when the benefits outweigh  the risk - Repeat CBC in the a.m.  AKI (acute kidney injury) ([place]) - Presumptive etiology secondary to prerenal in setting of GI loss - Status post LR 1 L bolus per EDP - We will continue with sodium chloride 125 mL/h, 1 day ordered - A.M. team to order CTA chest abdomen and pelvis per gastroenterology recommendation once renal function improves - Recheck BMP in the a.m.  Encounter for alcohol cessation counseling - Patient endorses understanding and compliance.  He denies tremors and or history of seizures or withdrawals - He recognizes that this is a sign that he should stop drinking alcohol  Hyperglycemia - Home glipizide 5 mg daily not resumed on admission due to high risk of hypoglycemia - Insulin SSI with at bedtime coverage ordered - Goal inpatient blood glucose level is 140-180  Tobacco use disorder - Nicotine patch as needed ordered  Acute myeloid leukemia in remission ([place]) - Currently on azacitidine 300 mg daily, two weeks on, two weeks off; resumed - Per med reconciliation patient is currently on his 2 weeks of taking medication with the last dose being on 07/31/2022, this has been resumed as appropriate - Continue outpatient follow-up with medical oncology  Chart reviewed.   DVT prophylaxis: TED hose, pharmacologic DVT prophylaxis not initiated on admission due to presenting symptom of hemoptysis versus hematemesis Code Status: Full code Diet: Clear liquids Family Communication: No Disposition Plan: Pending clinical course Consults called: Gastroenterology Admission status: Inpatient, telemetry medical  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[place])    Agranulocytosis secondary to cancer chemotherapy (CODE) ([place])    Chemotherapy induced neutropenia ([place])    Pancreatitis    Pancytopenia ([place])    Thrombocytopenia ([place])    Tobacco use disorder    Past Surgical History:  Procedure Laterality Date   BONE MARROW BIOPSY      Social History:  reports that he quit smoking about 2 years ago. His smoking use included cigarettes. He has a 17.00 pack-year smoking history. He has never used smokeless tobacco. He reports current alcohol use of about 18.0 standard drinks of alcohol per week. He reports that he does not currently use drugs.  Allergies  Allergen Reactions   Dapsone     Contraindication - G6PD deficient   Primaquine     Contraindication - G6PD deficient   Rasburicase     Contraindication - G6PD deficient   Family History  Problem Relation Age of Onset   Hypertension Mother    Diabetes Mother    Diabetes Father    Prostate cancer Neg Hx    Kidney cancer Neg Hx    Bladder Cancer Neg Hx    Family history: Family history reviewed and not pertinent.  Prior to Admission medications   Medication Sig Start Date End Date Taking? Authorizing Provider  azaCITIDine (ONUREG) 300 MG tablet Take 1 tablet (300 mg total) by mouth daily. Take for 14 days, hold for 14 days. Repeat every 28 days. 07/13/22  Yes [name], MD  glipiZIDE (GLUCOTROL) 5 MG tablet Take 1 tablet (5 mg total) by mouth daily. 05/18/22  Yes Theresia Lo, NP  prochlorperazine (COMPAZINE) 10 MG tablet Take 1 tablet (10 mg total) by mouth every 6 (six) hours  as needed for nausea or vomiting. 06/29/22  Yes [name], MD  valACYclovir (VALTREX) 500 MG tablet Take 500 mg by mouth daily. 06/24/20  Yes [provider]  diclofenac Sodium (VOLTAREN) 1 % GEL Apply 2 g topically 4 (four) times daily. 07/13/22   Theresia Lo, NP  miconazole (MICOTIN) 2 % cream Apply 1 Application topically 2 (two) times daily. 04/10/22   Hans Eden, NP   Physical Exam: Vitals:   07/23/22 1545 07/23/22 1600 07/23/22 1800 07/23/22 2143  BP:  138/87  116/69  Pulse:  90 80 69  Resp:  _0 Temp: 98 F (36.7 C)  98.3 F (36.8 C) 98.2 F (36.8 C)  TempSrc: Oral  Oral Oral  SpO2:  98% 100% 99%  Weight:      Height:        Constitutional: appears age-appropriate, NAD, calm, comfortable Eyes: PERRL, lids and conjunctivae normal ENMT: Mucous membranes are moist. Posterior pharynx clear of any exudate or lesions. Age-appropriate dentition. Hearing appropriate Neck: normal, supple, no masses, no thyromegaly Respiratory: clear to auscultation bilaterally, no wheezing, no crackles. Normal respiratory effort. No accessory muscle use.  Cardiovascular: Regular rate and rhythm, no murmurs / rubs / gallops. No extremity edema. 2+ pedal pulses. No carotid bruits.  Abdomen: no tenderness, no masses palpated, no hepatosplenomegaly. Bowel sounds positive.  Musculoskeletal: no clubbing / cyanosis. No joint deformity upper and lower extremities. Good ROM, no contractures, no atrophy. Normal muscle tone.  Skin: no rashes, lesions, ulcers. No induration Neurologic: Sensation intact. Strength 5/5 in all 4.  Psychiatric: Normal judgment and insight. Alert and oriented x 3. Normal mood.   EKG: independently reviewed, showing sinus tachycardia with a rate of 110, QTc 451  Chest x-ray on Admission: I personally reviewed and I agree with radiologist reading as below.  DG Chest Port 1 View  Result Date: 07/23/2022 CLINICAL DATA:  Dyspnea on exertion. History of leukemia. Throwing up blood this morning. EXAM: PORTABLE CHEST 1 VIEW COMPARISON:  03/29/2020. FINDINGS: Cardiac silhouette mildly enlarged.  No mediastinal or hilar masses. Clear lungs.  No pleural effusion or pneumothorax. Skeletal structures are grossly intact. IMPRESSION: No active disease. Electronically Signed   By: Lajean Manes M.D.   On: 07/23/2022 14:05    Labs on Admission: I have personally reviewed following labs  CBC: Recent Labs  Lab 07/23/22 1122  WBC 4.1  NEUTROABS 2.9  HGB 14.9  HCT 44.5  MCV 90.8  PLT 696   Basic Metabolic Panel: Recent Labs  Lab 07/23/22 1122 07/23/22 1430  NA 141  --   K 4.5  --   CL 106  --   CO2 25  --   GLUCOSE 124*   --   BUN 16  --   CREATININE 1.75*  --   CALCIUM 9.6  --   MG 1.8 1.7  PHOS  --  2.9   GFR: Estimated Creatinine Clearance: 60.7 mL/min (A) (by C-G formula based on SCr of 1.75 mg/dL (H)).  Liver Function Tests: Recent Labs  Lab 07/23/22 1122  AST 31  ALT 46*  ALKPHOS 86  BILITOT 0.8  PROT 7.8  ALBUMIN 4.5   Coagulation Profile: Recent Labs  Lab 07/23/22 1122  INR 1.0   Urine analysis:    Component Value Date/Time   COLORURINE STRAW (A) 04/19/2022 1514   APPEARANCEUR CLEAR (A) 04/19/2022 1514   LABSPEC 1.030 04/19/2022 1514   PHURINE 6.0 04/19/2022 1514   GLUCOSEU >=500 (A)  04/19/2022 1514   HGBUR SMALL (A) 04/19/2022 1514   BILIRUBINUR NEGATIVE 04/19/2022 1514   BILIRUBINUR small 11/23/2019 [place] 04/19/2022 1514   PROTEINUR NEGATIVE 04/19/2022 1514   UROBILINOGEN 0.2 11/23/2019 1457   NITRITE NEGATIVE 04/19/2022 1514   LEUKOCYTESUR NEGATIVE 04/19/2022 1514   Dr. Tobie Poet [facility]  If 7PM-7AM, please contact overnight-coverage provider If 7AM-7PM, please contact day coverage provider [email or web address]  07/23/2022, 9:47 PM

## 2022-07-23 NOTE — ED Triage Notes (Signed)
Patient reports hx of leukemia but started throwing up blood this morning.  Reports he drinks atleast 12 pack of beer on the weeks.  Reports numerous episodes of bright red blood with clots.

## 2022-07-23 NOTE — Telephone Encounter (Signed)
Patient called to report that he was coughing up "big blood clots". Per RN patient needs to go to the ER. Patient was agreeable and stated that his sister would come pick him up and take him.

## 2022-07-23 NOTE — ED Provider Notes (Signed)
[facility] Provider Note    Event Date/Time   First MD Initiated Contact with Patient 07/23/22 1102     (approximate)   History   Chief Complaint Hematemesis   HPI  Carl [name]. is a 46 y.o. male with past medical history of AML in remission and alcohol abuse who presents to the ED complaining of hematemesis.  Patient reports that he has been dealing with intermittent nausea and vomiting over the past month, will occasionally see small amounts of bright red blood in his emesis.  He has not been evaluated for this, but became more concerned earlier today when he reports vomiting up larger amounts of blood with clots.  He reports 2 episodes of large-volume hematemesis with small clots, continues to feel nauseous.  He states his stool has been dark recently but he has not noticed any overt blood in his stool.  He is not aware of any fevers and denies abdominal pain, dysuria, hematuria, flank pain, chest pain, or shortness of breath.  He does not take any blood thinners, denies NSAID or steroid use.  He does admit to regular alcohol consumption, most days of the week but not every day.  He reports consuming as much as 12 beers at a time, will also occasionally drink wine and liquor.  He denies any history of liver disease and has never had an endoscopy.      Physical Exam   Triage Vital Signs: ED Triage Vitals  Enc Vitals Group     BP 07/23/22 1055 135/80     Pulse Rate 07/23/22 1055 85     Resp 07/23/22 1055 20     Temp --      Temp src --      SpO2 07/23/22 1055 100 %     Weight 07/23/22 1057 215 lb (97.5 kg)     Height 07/23/22 1057 '5\' 9"'$  (1.753 m)     Head Circumference --      Peak Flow --      Pain Score 07/23/22 1057 0     Pain Loc --      Pain Edu? --      Excl. in [place]? --     Most recent vital signs: Vitals:   07/23/22 1149 07/23/22 1150  BP: 129/74   Pulse:    Resp:  16  Temp:    SpO2:      Constitutional: Alert and  oriented. Eyes: Conjunctivae are normal. Head: Atraumatic. Nose: No congestion/rhinnorhea. Mouth/Throat: Mucous membranes are moist.  Cardiovascular: Normal rate, regular rhythm. Grossly normal heart sounds.  2+ radial pulses bilaterally. Respiratory: Normal respiratory effort.  No retractions. Lungs CTAB. Gastrointestinal: Soft and nontender. No distention. Musculoskeletal: No lower extremity tenderness nor edema.  Neurologic:  Normal speech and language. No gross focal neurologic deficits are appreciated.    ED Results / Procedures / Treatments   Labs (all labs ordered are listed, but only abnormal results are displayed) Labs Reviewed  COMPREHENSIVE METABOLIC PANEL - Abnormal; Notable for the following components:      Result Value   Glucose, Bld 124 (*)    Creatinine, Ser 1.75 (*)    ALT 46 (*)    GFR, Estimated 48 (*)    All other components within normal limits  CBC WITH DIFFERENTIAL/PLATELET  PROTIME-INR  MAGNESIUM  ETHANOL  PHOSPHORUS  MAGNESIUM  TYPE AND SCREEN     EKG  ED ECG REPORT I, Blake Divine, the attending physician, personally viewed  and interpreted this ECG.   Date: 07/23/2022  EKG Time: 11:24  Rate: 110  Rhythm: sinus tachycardia  Axis: RAD  Intervals:none  ST&T Change: Lateral T wave inversions  PROCEDURES:  Critical Care performed: No  Procedures   MEDICATIONS ORDERED IN ED: Medications  valACYclovir (VALTREX) tablet 500 mg (has no administration in time range)  acetaminophen (TYLENOL) tablet 650 mg (has no administration in time range)    Or  acetaminophen (TYLENOL) suppository 650 mg (has no administration in time range)  ondansetron (ZOFRAN) tablet 4 mg (has no administration in time range)    Or  ondansetron (ZOFRAN) injection 4 mg (has no administration in time range)  hydrALAZINE (APRESOLINE) injection 5 mg (has no administration in time range)  insulin aspart (novoLOG) injection 0-5 Units (has no administration in time  range)  insulin aspart (novoLOG) injection 0-9 Units (has no administration in time range)  ondansetron (ZOFRAN) injection 4 mg (4 mg Intravenous Given 07/23/22 1143)  pantoprazole (PROTONIX) injection 40 mg (40 mg Intravenous Given 07/23/22 1143)  lactated ringers bolus 1,000 mL (1,000 mLs Intravenous New Bag/Given 07/23/22 1145)     IMPRESSION / MDM / ASSESSMENT AND PLAN / ED COURSE  I reviewed the triage vital signs and the nursing notes.                              46 y.o. male with past medical history of AML in remission and alcohol abuse who presents to the ED complaining of intermittent small-volume hematemesis over the past month that was more severe this morning with 2 episodes of large-volume hematemesis.  [name] presentation is most consistent with acute presentation with potential threat to life or bodily function.  Differential diagnosis includes, but is not limited to, peptic ulcer disease, esophageal varices, anemia, other upper GI bleed, lower GI bleed, electrolyte abnormality, AKI, alcohol withdrawal.  Patient nontoxic-appearing and in no acute distress, vital signs are unremarkable.  He has a benign abdominal exam, is not actively vomiting on my assessment.  His described large-volume hematemesis is concerning for upper GI bleed and we will give dose of IV Protonix, labs are pending at this time.  Plan to hold off on octreotide for now given no documented history of esophageal varices or liver disease, although patient does have significant history of alcohol abuse.  Plan to discuss with GI following lab results, we will hydrate with IV fluids and treat symptomatically with IV Zofran for now.  Labs are reassuring with no significant anemia, leukocytosis, or electrolyte abnormality, patient does have mild AKI.  LFTs are unremarkable and I doubt significant liver disease contributing to his presentation.  He has had no further hematemesis here in the ED and low suspicion for  variceal bleed.  Patient did have another episode of emesis consisting of greenish fluid with no obvious blood.  Case discussed with hospitalist for admission      FINAL CLINICAL IMPRESSION(S) / ED DIAGNOSES   Final diagnoses:  Hematemesis with nausea  Alcohol abuse     Rx / DC Orders   ED Discharge Orders     None        Note:  This document was prepared using Dragon voice recognition software and may include unintentional dictation errors.   Blake Divine, MD 07/23/22 1256

## 2022-07-23 NOTE — Assessment & Plan Note (Signed)
-   Patient endorses understanding and compliance.  He denies tremors and or history of seizures or withdrawals - He recognizes that this is a sign that he should stop drinking alcohol

## 2022-07-23 NOTE — Assessment & Plan Note (Addendum)
Versus hemoptysis - Check ethanol level - If patient continues to have symptoms and CBC downtrends, a.m. team to consider pulmonology consultation for bronchoscopy versus GI for endoscopy - Protonix 40 mg IV twice daily, patient has negative history for variceal bleed, gastric ulcer - Clear liquids at this time - Stat portable chest x-ray ordered - Gastroenterology has been consulted via secure chat and Epic order and is aware - Gastroenterology specialist, Dr. Allen Norris recommends a CTA chest abdomen pelvis, this has not been ordered on admission due to acute kidney injury, a.m. team to order when the benefits outweigh the risk - Repeat CBC in the a.m.

## 2022-07-23 NOTE — Telephone Encounter (Signed)
Got a call that pt. Coughing of blood and I spoke to [place] and he will go to ER. Sister is taking him.

## 2022-07-23 NOTE — Assessment & Plan Note (Signed)
-   Home glipizide 5 mg daily not resumed on admission due to high risk of hypoglycemia - Insulin SSI with at bedtime coverage ordered - Goal inpatient blood glucose level is 140-180

## 2022-07-23 NOTE — Assessment & Plan Note (Signed)
-   Nicotine patch as needed ordered ?

## 2022-07-23 NOTE — Consult Note (Signed)
[name], MD [facility]  [address], [place], [place] [address] Phone: [phone] Fax : [phone]  Consultation  Referring Provider:     Dr. Tobie Poet Primary Care Physician:  Theresia Lo, NP Primary Gastroenterologist:  Althia Forts         Reason for Consultation:     Hematemesis  Date of Admission:  07/23/2022 Date of Consultation:  07/23/2022         HPI:   Carl [name]. is a 46 y.o. 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  The patient reports that he has had hematemesis off and on.  He states that in the past that has been a small amount of blood but he had significantly more bleeding this time.  He reports that his first episodes of vomiting was just bile but then he started to have bright red blood with clots.  He reports he has had episodes like this including a month ago.  He denies taking any anti-inflammatory medication but does state that he does drink on the weekends but not during the week.  He usually drinks approximately a case of beers on the weekend. The patient denies any NSAID use.  He also reports that he has not seen a gastrologist in the past.  Most of his issues have started with the nausea and vomiting which have increased intermittently over the last month.  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 Authorizing Provider  azaCITIDine (ONUREG) 300 MG tablet Take 1 tablet (300 mg total) by mouth daily. Take for 14 days, hold for 14 days. Repeat every 28 days. 07/13/22  Yes [name], MD  glipiZIDE (GLUCOTROL) 5 MG tablet Take 1 tablet (5 mg total) by mouth daily. 05/18/22  Yes Theresia Lo, NP  prochlorperazine (COMPAZINE) 10 MG tablet Take 1 tablet (10 mg total) by mouth every 6 (six) hours as needed for nausea or vomiting. 06/29/22  Yes [name], MD  valACYclovir (VALTREX) 500 MG tablet Take 500 mg by mouth daily. 06/24/20  Yes [provider]  diclofenac Sodium (VOLTAREN) 1 % GEL Apply 2 g topically 4 (four) times daily. 07/13/22   Theresia Lo, NP  miconazole (MICOTIN) 2 % cream Apply 1 Application topically 2 (two) times daily. 04/10/22   Hans Eden, NP    Family History  Problem Relation Age of Onset   Hypertension Mother    Diabetes Mother    Diabetes Father    Prostate cancer Neg Hx    Kidney cancer Neg Hx    Bladder Cancer Neg Hx  Social History   Tobacco Use   Smoking status: Former    Packs/day: 1.00    Years: 17.00    Total pack years: 17.00    Types: Cigarettes    Quit date: 03/27/2020    Years since quitting: 2.3   Smokeless tobacco: Never  Vaping Use   Vaping Use: Never used  Substance Use Topics   Alcohol use: Yes    Alcohol/week: 18.0 standard drinks of alcohol    Types: 18 Cans of beer per week    Comment: Socially   Drug use: Not Currently    Allergies as of 07/23/2022 - Review Complete 07/23/2022  Allergen Reaction Noted   Dapsone  04/05/2020   Primaquine  04/05/2020   Rasburicase  04/05/2020    Review of Systems:    All systems reviewed and negative except where noted in HPI.   Physical Exam:  Vital signs in last 24 hours: Temp:  [98 F (36.7 C)-98.6 F (37 C)] 98 F (36.7 C) (11/27 1545) Pulse Rate:  [85-107] 96 (11/27 1530) Resp:   [16-23] 20 (11/27 1530) BP: (114-135)/(44-84) 128/81 (11/27 1530) SpO2:  [95 %-100 %] 98 % (11/27 1530) Weight:  [97.5 kg] 97.5 kg (11/27 1057)   General:   Pleasant, cooperative in NAD Head:  Normocephalic and atraumatic. Eyes:   No icterus.   Conjunctiva pink. PERRLA. Ears:  Normal auditory acuity. Neck:  Supple; no masses or thyroidomegaly Lungs: Respirations even and unlabored. Lungs clear to auscultation bilaterally.   No wheezes, crackles, or rhonchi.  Heart:  Regular rate and rhythm;  Without murmur, clicks, rubs or gallops Abdomen:  Soft, nondistended, nontender. Normal bowel sounds. No appreciable masses or hepatomegaly.  No rebound or guarding.  Rectal:  Not performed. Msk:  Symmetrical without gross deformities.    Extremities:  Without edema, cyanosis or clubbing. Neurologic:  Alert and oriented x3;  grossly normal neurologically. Skin:  Intact without significant lesions or rashes. Cervical Nodes:  No significant cervical adenopathy. Psych:  Alert and cooperative. Normal affect.  LAB RESULTS: Recent Labs    07/23/22 1122  WBC 4.1  HGB 14.9  HCT 44.5  PLT 206   BMET Recent Labs    07/23/22 1122  NA 141  K 4.5  CL 106  CO2 25  GLUCOSE 124*  BUN 16  CREATININE 1.75*  CALCIUM 9.6   LFT Recent Labs    07/23/22 1122  PROT 7.8  ALBUMIN 4.5  AST 31  ALT 46*  ALKPHOS 86  BILITOT 0.8   PT/INR Recent Labs    07/23/22 1122  LABPROT 13.3  INR 1.0    STUDIES: DG Chest Port 1 View  Result Date: 07/23/2022 CLINICAL DATA:  Dyspnea on exertion. History of leukemia. Throwing up blood this morning. EXAM: PORTABLE CHEST 1 VIEW COMPARISON:  03/29/2020. FINDINGS: Cardiac silhouette mildly enlarged.  No mediastinal or hilar masses. Clear lungs.  No pleural effusion or pneumothorax. Skeletal structures are grossly intact. IMPRESSION: No active disease. Electronically Signed   By: Lajean Manes M.D.   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 is a 46 y.o. y/o male with With a history of nausea and vomiting with some hematemesis in the  past but to a small amount to his now admitted with a significantly increase episode of hematemesis.  The patient has had a stable hemoglobin.  The differential diagnosis includes a Mallory-Weiss tear since his vomiting of bile proceeded any visible blood.  It is unlikely that the patient has cirrhosis and variceal bleeding since his other markers such as platelets and Albumin are not consistent with chronic liver disease.  Plan:  The patient will be set up for a upper endoscopy for tomorrow.  The patient will be kept nothing by mouth after midnight.  The patient has been explained the plan and agrees with it.  Thank you for involving me in the care of this patient.      LOS: 0 days   [name], MD, [facility] 07/23/2022, 4:19 PM,  Pager [phone] 7am-5pm  Check AMION for 5pm -7am coverage and on weekends   Note: This dictation was prepared with Dragon dictation along with smaller phrase technology. Any transcriptional errors that result from this process are unintentional.

## 2022-07-23 NOTE — Assessment & Plan Note (Addendum)
-   Currently on azacitidine 300 mg daily, two weeks on, two weeks off; resumed - Per med reconciliation patient is currently on his 2 weeks of taking medication with the last dose being on 07/31/2022, this has been resumed as appropriate - Continue outpatient follow-up with medical oncology

## 2022-07-23 NOTE — Assessment & Plan Note (Addendum)
-   Presumptive etiology secondary to prerenal in setting of GI loss - Status post LR 1 L bolus per EDP - We will continue with sodium chloride 125 mL/h, 1 day ordered - A.M. team to order CTA chest abdomen and pelvis per gastroenterology recommendation once renal function improves - Recheck BMP in the a.m.

## 2022-07-23 NOTE — Hospital Course (Addendum)
[name], Carl [name]. 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  Initial vitals in the emergency department show temperature of 98.6, respiration rate of 20, heart rate of 85, blood pressure 135/80, SpO2 100% on room air.  Serum sodium is 141, potassium 4.5, chloride 106, bicarb 25, BUN of 16, serum creatinine of 1.75, nonfasting blood glucose 124, EGFR 48, WBC 4.1, hemoglobin 14.9, platelets of 206.  ED treatment: Ondansetron 4 mg IV, Protonix 40 mg IV, LR 1 L bolus

## 2022-07-24 ENCOUNTER — Encounter
Admission: EM | Disposition: A | Discharge status: Home / Self Care | Payer: Self-pay | Source: Home / Self Care | Attending: Internal Medicine

## 2022-07-24 ENCOUNTER — Inpatient Hospital Stay: Payer: Medicaid Other | Admitting: Anesthesiology

## 2022-07-24 ENCOUNTER — Encounter: Payer: Self-pay | Admitting: Internal Medicine

## 2022-07-24 DIAGNOSIS — K920 Hematemesis: Secondary | ICD-10-CM

## 2022-07-24 HISTORY — PX: ESOPHAGOGASTRODUODENOSCOPY (EGD) WITH PROPOFOL: SHX5813

## 2022-07-24 LAB — CBG MONITORING, ED
Glucose-Capillary: 104 mg/dL — ABNORMAL HIGH (ref 70–99)
Glucose-Capillary: 102 mg/dL — ABNORMAL HIGH (ref 70–99)

## 2022-07-24 LAB — CBC
HCT: 39.7 % (ref 39.0–52.0)
Hemoglobin: 13.2 g/dL (ref 13.0–17.0)
MCH: 30.4 pg (ref 26.0–34.0)
MCHC: 33.2 g/dL (ref 30.0–36.0)
MCV: 91.5 fL (ref 80.0–100.0)
Platelets: 170 10*3/uL (ref 150–400)
RBC: 4.34 MIL/uL (ref 4.22–5.81)
RDW: 12.8 % (ref 11.5–15.5)
WBC: 3.9 10*3/uL — ABNORMAL LOW (ref 4.0–10.5)
nRBC: 0 % (ref 0.0–0.2)

## 2022-07-24 LAB — BASIC METABOLIC PANEL
Anion gap: 6 (ref 5–15)
BUN: 13 mg/dL (ref 6–20)
CO2: 24 mmol/L (ref 22–32)
Calcium: 8.6 mg/dL — ABNORMAL LOW (ref 8.9–10.3)
Chloride: 106 mmol/L (ref 98–111)
Creatinine, Ser: 1.26 mg/dL — ABNORMAL HIGH (ref 0.61–1.24)
GFR, Estimated: >60 mL/min (ref >60)
Glucose, Bld: 93 mg/dL (ref 70–99)
Potassium: 3.7 mmol/L (ref 3.5–5.1)
Sodium: 136 mmol/L (ref 135–145)

## 2022-07-24 LAB — TYPE AND SCREEN
ABO/RH(D): B POS
Antibody Screen: NEGATIVE

## 2022-07-24 SURGERY — ESOPHAGOGASTRODUODENOSCOPY (EGD) WITH PROPOFOL
Anesthesia: General

## 2022-07-24 MED ORDER — SODIUM CHLORIDE 0.9 % IV SOLN: INTRAVENOUS | Status: DC

## 2022-07-24 MED ORDER — PROPOFOL 10 MG/ML IV BOLUS
INTRAVENOUS | Status: DC | PRN
  Administered 2022-07-24: 165 ug/kg/min via INTRAVENOUS
  Administered 2022-07-24: 80 mg via INTRAVENOUS

## 2022-07-24 NOTE — OR Nursing (Signed)
Drs Allen Norris and Billie Ruddy consulted together and decided to discharge Carl [name] from the endoscopy unit.  Discharge instructions were given to him and a handout was also given to him. Patient was getting picked up by sister

## 2022-07-24 NOTE — Op Note (Signed)
[facility] Gastroenterology Patient Name: Carl [name] Procedure Date: 07/24/2022 11:53 AM MRN: [id number] Account #: [id number] Date of Birth: [birth year] Admit Type: Inpatient Age: 46 Room: [facility] ENDO ROOM 4 Gender: Male Note Status: Finalized Instrument Name: Michaelle Birks [id number] Procedure:             Upper GI endoscopy Indications:           Hematemesis Providers:             [name] MD, MD Referring MD:          Theresia Lo (Referring MD) Medicines:             Propofol per Anesthesia Complications:         No immediate complications. Procedure:             Pre-Anesthesia Assessment:                        - Prior to the procedure, a History and Physical was                         performed, and patient medications and allergies were                         reviewed. The patient's tolerance of previous                         anesthesia was also reviewed. The risks and benefits                         of the procedure and the sedation options and risks                         were discussed with the patient. All questions were                         answered, and informed consent was obtained. Prior                         Anticoagulants: The patient has taken no anticoagulant                         or antiplatelet agents. ASA Grade Assessment: II - A                         patient with mild systemic disease. After reviewing                         the risks and benefits, the patient was deemed in                         satisfactory condition to undergo the procedure.                        After obtaining informed consent, the endoscope was                         passed under direct vision. Throughout the procedure,  the patient's blood pressure, pulse, and oxygen                         saturations were monitored continuously. The Endoscope                         was introduced through the mouth, and advanced to  the                         second part of duodenum. The upper GI endoscopy was                         accomplished without difficulty. The patient tolerated                         the procedure well. Findings:      The examined esophagus was normal.      The entire examined stomach was normal.      The examined duodenum was normal.      The hematemesis was likely a Mallory-Weiss tear from vomiting. Impression:            - Normal esophagus.                        - Normal stomach.                        - Normal examined duodenum.                        - The hematemesis was likely a Mallory-Weiss tear from                         vomiting.                        - No specimens collected. Recommendation:        - Return patient to hospital ward for ongoing care.                        - Resume regular diet.                        - Continue present medications. Procedure Code(s):     --- Professional ---                        [phone], Esophagogastroduodenoscopy, flexible,                         transoral; diagnostic, including collection of                         specimen(s) by brushing or washing, when performed                         (separate procedure) Diagnosis Code(s):     --- Professional ---                        K92.0, Hematemesis CPT copyright 2022 American Medical Association. All rights reserved. The codes documented in this report  are preliminary and upon coder review may  be revised to meet current compliance requirements. [name] MD, MD 07/24/2022 12:12:19 PM This report has been signed electronically. Number of Addenda: 0 Note Initiated On: 07/24/2022 11:53 AM Estimated Blood Loss:  Estimated blood loss: none.      [facility]

## 2022-07-24 NOTE — Transfer of Care (Signed)
Immediate Anesthesia Transfer of Care Note  Patient: Carl [name].  Procedure(s) Performed: ESOPHAGOGASTRODUODENOSCOPY (EGD) WITH PROPOFOL  Patient Location: Endoscopy Unit  Anesthesia Type:General  Level of Consciousness: drowsy  Airway & Oxygen Therapy: Patient Spontanous Breathing  Post-op Assessment: Report given to RN and Post -op Vital signs reviewed and stable  Post vital signs: Reviewed and stable  Last Vitals:  Vitals Value Taken Time  BP 108/63   Temp    Pulse 86   Resp 14   SpO2 96     Last Pain:  Vitals:   07/24/22 1031  TempSrc: Temporal  PainSc: 0-No pain         Complications: No notable events documented.

## 2022-07-24 NOTE — Anesthesia Procedure Notes (Signed)
Date/Time: 07/24/2022 12:10 PM  Performed by: Fredderick Phenix, CRNAPre-anesthesia Checklist: Emergency Drugs available, Patient identified, Suction available, Patient being monitored and Timeout performed Patient Re-evaluated:Patient Re-evaluated prior to induction Oxygen Delivery Method: Simple face mask Induction Type: IV induction

## 2022-07-24 NOTE — Anesthesia Preprocedure Evaluation (Addendum)
Anesthesia Evaluation  Patient identified by MRN, date of birth, ID band Patient awake    Reviewed: Allergy & Precautions, NPO status , [name] Chart, lab work & pertinent test results  Airway Mallampati: III  TM Distance: >3 FB Neck ROM: full    Dental no notable dental hx.    Pulmonary former smoker   Pulmonary exam normal        Cardiovascular negative cardio ROS Normal cardiovascular exam     Neuro/Psych negative neurological ROS  negative psych ROS   GI/Hepatic ,,,(+)     substance abuse  alcohol usehematemesis off and on   Endo/Other  negative endocrine ROS    Renal/GU Renal diseaseMild AKI  negative genitourinary   Musculoskeletal   Abdominal  (+) + obese  Peds  Hematology Acute myeloid leukemia in remission   Anesthesia Other Findings  G6PD deficiency  He has admitted to regular alcohol consumption, most days of the week but not every day.  He reports consuming as much as 12 beers at a time, will also occasionally drink wine and liquor.  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[place]) No date: Agranulocytosis secondary to cancer chemotherapy (CODE) (HCC) No date: Chemotherapy induced neutropenia (HCC) No date: Pancreatitis No date: Pancytopenia ([place]) No date: Thrombocytopenia (HCC) No date: Tobacco use disorder  Past Surgical History: No date: BONE MARROW BIOPSY  BMI    Body Mass Index: 31.75 kg/m      Reproductive/Obstetrics negative OB ROS                             Anesthesia Physical Anesthesia Plan  ASA: 3  Anesthesia Plan: General   Post-op Pain Management: Minimal or no pain anticipated   Induction: Intravenous  PONV Risk Score and Plan: Propofol infusion and TIVA  Airway Management Planned: Natural Airway  Additional Equipment:   Intra-op Plan:   Post-operative Plan:   Informed Consent: I have reviewed the  patients History and Physical, chart, labs and discussed the procedure including the risks, benefits and alternatives for the proposed anesthesia with the patient or authorized representative who has indicated his/her understanding and acceptance.     Dental Advisory Given  Plan Discussed with: Anesthesiologist, CRNA and Surgeon  Anesthesia Plan Comments:         Anesthesia Quick Evaluation

## 2022-07-24 NOTE — Discharge Summary (Signed)
Physician Discharge Summary   Carl [name].  male DOB: Nov 18, 1975  WUJ:[id number]  PCP: Theresia Lo, NP  Admit date: 07/23/2022 Discharge date: 07/24/2022  Admitted From: home Disposition:  home CODE STATUS: Full code   Hospital Course:  For full details, please see H&P, progress notes, consult notes and ancillary notes.  Briefly,  Carl [name], [place]. is a 46 year old male with history of acute myeloid leukemia in remission, agranulocytosis secondary to chemotherapy, tobacco use disorder, heavy alcohol use, who presented emergency department for chief concerns of two episodes of hematemesis, with frank blood and black clots.   Hematemesis --Hgb stable around 13.  No hematemesis since presentation.   --EGD performed with no acute finding.  Per GI, hematemesis was likely a Mallory-Weiss tear from vomiting.   AKI (acute kidney injury) ([place]) - Cr 1.75 on presentation, improved to 1.26 with IVF.     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 The score is  based on dignosis, age, lab data, medications, orders, and past utilization.   Low:  0-14.9   Medium: 15-21.9   High: 22-29.9   Extreme: 30 and above         Discharge Instructions:  Allergies as of 07/24/2022       Reactions   Dapsone    Contraindication - G6PD deficient   Primaquine    Contraindication - G6PD deficient   Rasburicase    Contraindication - G6PD deficient        Medication List     TAKE these medications    azaCITIDine 300 MG tablet Commonly known as: ONUREG Take 1 tablet (300 mg total) by mouth daily. Take for 14 days, hold for 14 days. Repeat every 28 days.   diclofenac Sodium 1 % Gel Commonly known as: VOLTAREN Apply 2 g topically 4 (four) times daily.   glipiZIDE 5 MG tablet Commonly known as: Glucotrol Take 1 tablet (5 mg total) by mouth daily.   miconazole 2 % cream Commonly known as: MICOTIN Apply 1 Application topically 2 (two) times daily.   prochlorperazine 10 MG tablet Commonly known as: COMPAZINE Take 1 tablet (10 mg total) by mouth every 6 (six) hours as needed for nausea or vomiting.   valACYclovir 500 MG tablet Commonly known as: VALTREX Take 500 mg by mouth daily.         Follow-up Information     Theresia Lo, NP Follow up in 1 week(s).   Specialties: Nurse Practitioner, Family Medicine Contact information: [address] [place] [address] [phone]  Allergies  Allergen Reactions   Dapsone     Contraindication - G6PD deficient   Primaquine     Contraindication - G6PD deficient   Rasburicase     Contraindication - G6PD deficient     The results of significant diagnostics from this hospitalization (including imaging, microbiology, ancillary and laboratory) are listed below for reference.   Consultations:   Procedures/Studies: DG Chest Port 1 View  Result Date: 07/23/2022 CLINICAL DATA:  Dyspnea on exertion. History of leukemia. Throwing up blood this morning. EXAM:  PORTABLE CHEST 1 VIEW COMPARISON:  03/29/2020. FINDINGS: Cardiac silhouette mildly enlarged.  No mediastinal or hilar masses. Clear lungs.  No pleural effusion or pneumothorax. Skeletal structures are grossly intact. IMPRESSION: No active disease. Electronically Signed   By: Lajean Manes M.D.   On: 07/23/2022 14:05      Labs: BNP (last 3 results) No results for input(s): "BNP" in the last 8760 hours. Basic Metabolic Panel: Recent Labs  Lab 07/23/22 1122 07/23/22 1430 07/24/22 0429  NA 141  --  136  K 4.5  --  3.7  CL 106  --  106  CO2 25  --  24  GLUCOSE 124*  --  93  BUN 16  --  13  CREATININE 1.75*  --  1.26*  CALCIUM 9.6  --  8.6*  MG 1.8 1.7  --   PHOS  --  2.9  --    Liver Function Tests: Recent Labs  Lab 07/23/22 1122  AST 31  ALT 46*  ALKPHOS 86  BILITOT 0.8  PROT 7.8  ALBUMIN 4.5   No results for input(s): "LIPASE", "AMYLASE" in the last 168 hours. No results for input(s): "AMMONIA" in the last 168 hours. CBC: Recent Labs  Lab 07/23/22 1122 07/24/22 0429  WBC 4.1 3.9*  NEUTROABS 2.9  --   HGB 14.9 13.2  HCT 44.5 39.7  MCV 90.8 91.5  PLT 206 170   Cardiac Enzymes: No results for input(s): "CKTOTAL", "CKMB", "CKMBINDEX", "TROPONINI" in the last 168 hours. BNP: Invalid input(s): "POCBNP" CBG: Recent Labs  Lab 07/23/22 1744 07/23/22 2138 07/24/22 0753 07/24/22 1035  GLUCAP 97 79 104* 102*   D-Dimer No results for input(s): "DDIMER" in the last 72 hours. Hgb A1c No results for input(s): "HGBA1C" in the last 72 hours. Lipid Profile No results for input(s): "CHOL", "HDL", "LDLCALC", "TRIG", "CHOLHDL", "LDLDIRECT" in the last 72 hours. Thyroid function studies No results for input(s): "TSH", "T4TOTAL", "T3FREE", "THYROIDAB" in the last 72 hours.  Invalid input(s): "FREET3" Anemia work up No results for input(s): "VITAMINB12", "FOLATE", "FERRITIN", "TIBC", "IRON", "RETICCTPCT" in the last 72 hours. Urinalysis    Component Value Date/Time    COLORURINE STRAW (A) 04/19/2022 1514   APPEARANCEUR CLEAR (A) 04/19/2022 1514   LABSPEC 1.030 04/19/2022 1514   PHURINE 6.0 04/19/2022 1514   GLUCOSEU >=500 (A) 04/19/2022 1514   HGBUR SMALL (A) 04/19/2022 1514   BILIRUBINUR NEGATIVE 04/19/2022 1514   BILIRUBINUR small 11/23/2019 1457   KETONESUR NEGATIVE 04/19/2022 1514   PROTEINUR NEGATIVE 04/19/2022 1514   UROBILINOGEN 0.2 11/23/2019 1457   NITRITE NEGATIVE 04/19/2022 1514   LEUKOCYTESUR NEGATIVE 04/19/2022 1514   Sepsis Labs Recent Labs  Lab 07/23/22 1122 07/24/22 0429  WBC 4.1 3.9*   Microbiology No results found for this or any previous visit (from the past 240 hour(s)).   Total time spend on discharging this patient, including the last patient exam, discussing the hospital stay, instructions for ongoing care as it relates  to all pertinent caregivers, as well as preparing the medical discharge records, prescriptions, and/or referrals as applicable, is 40 minutes.    Enzo Bi, MD  [facility] 07/24/2022, 1:11 PM

## 2022-07-24 NOTE — Progress Notes (Signed)
Prior-To-Admission Oral Chemotherapy for Treatment of Oncologic Disease   Order noted from Dr. Tobie Poet to continue prior-to-admission oral chemotherapy regimen of Azacitidine.  Procedure Per Pharmacy & Therapeutics Committee Policy: Orders for continuation of home oral chemotherapy for treatment of an oncologic disease will be held unless approved by an oncologist during current admission.    For patients receiving oncology care at [facility], inpatient pharmacist contacts patient's oncologist during regular office hours to review. If earlier review is medically necessary, attending physician consults [facility] on-call oncologist   For patients receiving oncology care outside of [facility], attending physician consults patient's oncologist to review. If this oncologist or their coverage cannot be reached, attending physician consults [facility] on-call oncologist   Oral chemotherapy continuation order is on hold pending oncologist review, [facility] oncologist Randa Evens, MD will be notified by inpatient pharmacy during office hours   Dr. Janese Banks request medication be held while admitted.   Lorin Picket, PharmD 07/24/2022, 1:03 PM

## 2022-07-25 ENCOUNTER — Encounter: Payer: Self-pay | Admitting: Gastroenterology

## 2022-07-25 ENCOUNTER — Telehealth: Payer: Self-pay

## 2022-07-25 ENCOUNTER — Encounter: Payer: Self-pay | Admitting: Hematology and Oncology

## 2022-07-25 NOTE — Anesthesia Postprocedure Evaluation (Signed)
Anesthesia Post Note  Patient: Carl [name].  Procedure(s) Performed: ESOPHAGOGASTRODUODENOSCOPY (EGD) WITH PROPOFOL  Patient location during evaluation: PACU Anesthesia Type: General Level of consciousness: awake and alert Pain management: pain level controlled Vital Signs Assessment: post-procedure vital signs reviewed and stable Respiratory status: spontaneous breathing, nonlabored ventilation and respiratory function stable Cardiovascular status: blood pressure returned to baseline and stable Postop Assessment: no apparent nausea or vomiting Anesthetic complications: no   No notable events documented.   Last Vitals:  Vitals:   07/24/22 1236 07/24/22 1256  BP: (!) 89/72 127/78  Pulse: 77   Resp: (!) 22 13  Temp:    SpO2: 100%     Last Pain:  Vitals:   07/24/22 1256  TempSrc:   PainSc: 0-No pain                 [place] Ouch

## 2022-07-25 NOTE — Telephone Encounter (Signed)
Transition Care Management Follow-up Telephone Call Date of discharge and from where: TCM DC [facility] 07-24-22 Dx: hematemesis How have you been since you were released from the hospital? Doing ok  Any questions or concerns? No  Items Reviewed: Did the pt receive and understand the discharge instructions provided? Yes  Medications obtained and verified? Yes  Other? No  Any new allergies since your discharge? No  Dietary orders reviewed? Yes Do you have support at home? Yes   Home Care and Equipment/Supplies: Were home health services ordered? no If so, what is the name of the agency? na  Has the agency set up a time to come to the patient's home? not applicable Were any new equipment or medical supplies ordered?  No What is the name of the medical supply agency? na Were you able to get the supplies/equipment? not applicable Do you have any questions related to the use of the equipment or supplies? No  Functional Questionnaire: (I = Independent and D = Dependent) ADLs: I  Bathing/Dressing- I  Meal Prep- I  Eating- I  Maintaining continence- I  Transferring/Ambulation- I  Managing Meds- I  Follow up appointments reviewed:  PCP Hospital f/u appt confirmed? Yes  Scheduled to see Theresia Lo NP on 08-02-22 @ [facility] f/u appt confirmed? No  . Are transportation arrangements needed? No  If their condition worsens, is the pt aware to call PCP or go to the Emergency Dept.? Yes Was the patient provided with contact information for the PCP's office or ED? Yes Was to pt encouraged to call back with questions or concerns? Yes   Juanda Crumble LPN [place] Direct Dial [phone]

## 2022-07-26 ENCOUNTER — Other Ambulatory Visit: Payer: Self-pay | Admitting: Urology

## 2022-07-27 ENCOUNTER — Telehealth: Payer: Self-pay

## 2022-07-27 NOTE — Telephone Encounter (Signed)
Transition Care Management Follow-up Telephone Call Date of discharge and from where: [facility] 07/26/2022 How have you been since you were released from the hospital? better Any questions or concerns? Yes  Items Reviewed: Did the pt receive and understand the discharge instructions provided? Yes  Medications obtained and verified? Yes  Other? No  Any new allergies since your discharge? No  Dietary orders reviewed? Yes Do you have support at home? Yes   Home Care and Equipment/Supplies: Were home health services ordered? no If so, what is the name of the agency? N/a  Has the agency set up a time to come to the patient's home? not applicable Were any new equipment or medical supplies ordered?  No What is the name of the medical supply agency? N/a Were you able to get the supplies/equipment? not applicable Do you have any questions related to the use of the equipment or supplies? No  Functional Questionnaire: (I = Independent and D = Dependent) ADLs: I  Bathing/Dressing- I  Meal Prep- I  Eating- I  Maintaining continence- I  Transferring/Ambulation- I  Managing Meds- I  Follow up appointments reviewed:  PCP Hospital f/u appt confirmed? Yes  Scheduled to see Theresia Lo on 08/02/2022 @ 2:00. [facility] f/u appt confirmed? No   Are transportation arrangements needed? No  If their condition worsens, is the pt aware to call PCP or go to the Emergency Dept.? Yes Was the patient provided with contact information for the PCP's office or ED? Yes Was to pt encouraged to call back with questions or concerns? Yes Juanda Crumble, LPN [place] Direct Dial [phone]

## 2022-08-02 ENCOUNTER — Ambulatory Visit (INDEPENDENT_AMBULATORY_CARE_PROVIDER_SITE_OTHER): Payer: Medicaid Other | Admitting: Nurse Practitioner

## 2022-08-02 ENCOUNTER — Encounter: Payer: Self-pay | Admitting: Nurse Practitioner

## 2022-08-02 VITALS — BP 133/78 | HR 91 | Temp 98.2°F | Ht 69.0 in | Wt 215.1 lb

## 2022-08-02 DIAGNOSIS — F1090 Alcohol use, unspecified, uncomplicated: Secondary | ICD-10-CM

## 2022-08-02 DIAGNOSIS — K920 Hematemesis: Secondary | ICD-10-CM

## 2022-08-02 MED ORDER — MOXIFLOXACIN HCL 0.5 % OP SOLN: 1.0000 [drp] | Freq: Three times a day (TID) | OPHTHALMIC | 0 refills | Status: DC

## 2022-08-02 NOTE — Progress Notes (Signed)
Established Patient Office Visit  Subjective:  Patient ID: Carl Hass., male    DOB: [birth year]  Age: 46 y.o. MRN: [id number]  CC:  Chief Complaint  Patient presents with   Hospitalization Follow-up    Patient states he has been doing ok since hospital. No vomiting since. He states that he was sick because they changed the nausea medication. They tried to switch him to zofran but he can't take that he is back on compazine.      HPI  Carl [name]. presents for hospital follow up. He was admitted to the hospital due to 2 episode of hematemesis at home.  He had a EGD while in the hospital with no acute finding.  He states that he is doing better after the discharge.  He states that he has vomiting because of the medication change while  he is on active therapy.  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[place])    Agranulocytosis secondary to cancer chemotherapy (CODE) ([place])    Chemotherapy induced neutropenia (H[facility])    Pancreatitis    Pancytopenia (HCC)    Thrombocytopenia (HCC)    Tobacco use disorder     Past Surgical History:  Procedure Laterality Date   BONE MARROW BIOPSY     ESOPHAGOGASTRODUODENOSCOPY (EGD) WITH PROPOFOL N/A 07/24/2022   Procedure: ESOPHAGOGASTRODUODENOSCOPY (EGD) WITH PROPOFOL;  Surgeon: [name], MD;  Location: [facility] ENDOSCOPY;  Service: Endoscopy;  Laterality: N/A;    Family History  Problem Relation Age of Onset   Hypertension Mother    Diabetes Mother    Diabetes Father    Prostate cancer Neg Hx    Kidney cancer Neg Hx    Bladder Cancer Neg Hx     Social History   Socioeconomic History   Marital status: Single    Spouse name: Not on file   Number of children: Not on file   Years of education: Not on file   Highest education level: Not on file  Occupational History   Not on file  Tobacco Use   Smoking status: Former    Packs/day: 1.00    Years: 17.00    Total pack years: 17.00     Types: Cigarettes    Quit date: 03/27/2020    Years since quitting: 2.3   Smokeless tobacco: Never  Vaping Use   Vaping Use: Never used  Substance and Sexual Activity   Alcohol use: Yes    Alcohol/week: 7.0 standard drinks of alcohol    Types: 7 Cans of beer per week    Comment: Socially   Drug use: Not Currently   Sexual activity: Yes  Other Topics Concern   Not on file  Social History Narrative   Not on file   Social Determinants of Health   Financial Resource Strain: Not on file  Food Insecurity: Not on file  Transportation Needs: No Transportation Needs (02/05/2022)   PRAPARE - Hydrologist (Medical): No    Lack of Transportation (Non-Medical): No  Physical Activity: Not on file  Stress: Not on file  Social Connections: Not on file  Intimate Partner Violence: Not on file     Outpatient Medications Prior to Visit  Medication Sig Dispense Refill   azaCITIDine (ONUREG) 300 MG tablet Take 1 tablet (300 mg total) by mouth daily. Take for 14 days, hold for 14 days. Repeat every 28 days. 14 tablet 6   diclofenac Sodium (  VOLTAREN) 1 % GEL Apply 2 g topically 4 (four) times daily. 100 g 1   glipiZIDE (GLUCOTROL) 5 MG tablet Take 1 tablet (5 mg total) by mouth daily. 90 tablet 1   miconazole (MICOTIN) 2 % cream Apply 1 Application topically 2 (two) times daily. 28.35 g 0   prochlorperazine (COMPAZINE) 10 MG tablet Take 1 tablet (10 mg total) by mouth every 6 (six) hours as needed for nausea or vomiting. 20 tablet 0   valACYclovir (VALTREX) 500 MG tablet Take 500 mg by mouth daily.     No facility-administered medications prior to visit.    Allergies  Allergen Reactions   Dapsone     Contraindication - G6PD deficient   Primaquine     Contraindication - G6PD deficient   Rasburicase     Contraindication - G6PD deficient    ROS Review of Systems  Constitutional: Negative.   HENT: Negative.    Eyes: Negative.   Respiratory:  Negative for  apnea and shortness of breath.   Cardiovascular:  Negative for chest pain and palpitations.  Gastrointestinal: Negative.   Genitourinary: Negative.   Musculoskeletal: Negative.   Allergic/Immunologic: Negative.   Neurological: Negative.   Psychiatric/Behavioral: Negative.        Objective:    Physical Exam Constitutional:      Appearance: Normal appearance. He is obese.  HENT:     Right Ear: Tympanic membrane normal.     Left Ear: Tympanic membrane normal.     Mouth/Throat:     Mouth: Mucous membranes are moist.     Pharynx: Oropharynx is clear.  Eyes:     Conjunctiva/sclera: Conjunctivae normal.     Pupils: Pupils are equal, round, and reactive to light.  Cardiovascular:     Rate and Rhythm: Normal rate and regular rhythm.     Pulses: Normal pulses.     Heart sounds: Normal heart sounds.  Pulmonary:     Effort: Pulmonary effort is normal.     Breath sounds: Normal breath sounds.  Abdominal:     General: Bowel sounds are normal.     Palpations: Abdomen is soft. There is no mass.     Tenderness: There is no abdominal tenderness.  Musculoskeletal:        General: Normal range of motion.     Cervical back: Normal range of motion.  Skin:    General: Skin is warm.  Neurological:     General: No focal deficit present.     Mental Status: He is alert and oriented to person, place, and time. Mental status is at baseline.  Psychiatric:        Mood and Affect: Mood normal.        Behavior: Behavior normal.        Thought Content: Thought content normal.        Judgment: Judgment normal.     BP 133/78   Pulse 91   Temp 98.2 F (36.8 C) (Temporal)   Ht _0  (1.753 m)   Wt 215 lb 1.6 oz (97.6 kg)   SpO2 96%   BMI 31.76 kg/m  Wt Readings from Last 3 Encounters:  08/02/22 215 lb 1.6 oz (97.6 kg)  07/24/22 210 lb (95.3 kg)  07/13/22 215 lb 12.8 oz (97.9 kg)     Health Maintenance  Topic Date Due   DTaP/Tdap/Td (1 - Tdap) Never done   COLONOSCOPY (Pts 45-27yr  Insurance coverage will need to be confirmed)  Never done   COVID-19 Vaccine (  3 - Pfizer risk series) 08/18/2022 (Originally 11/07/2020)   INFLUENZA VACCINE  11/25/2022 (Originally 03/27/2022)   Hepatitis C Screening  Completed   HIV Screening  Completed   HPV VACCINES  Aged Out    There are no preventive care reminders to display for this patient.  Lab Results  Component Value Date   TSH 0.957 04/19/2022   Lab Results  Component Value Date   WBC 3.4 (L) 08/03/2022   HGB 14.0 08/03/2022   HCT 42.5 08/03/2022   MCV 92.2 08/03/2022   PLT 144 (L) 08/03/2022   Lab Results  Component Value Date   NA 136 08/03/2022   K 3.7 08/03/2022   CO2 23 08/03/2022   GLUCOSE 83 08/03/2022   BUN 19 08/03/2022   CREATININE 1.36 (H) 08/03/2022   BILITOT 0.9 08/03/2022   ALKPHOS 66 08/03/2022   AST 31 08/03/2022   ALT 37 08/03/2022   PROT 7.2 08/03/2022   ALBUMIN 4.1 08/03/2022   CALCIUM 9.0 08/03/2022   ANIONGAP 8 08/03/2022   Lab Results  Component Value Date   CHOL 153 12/10/2019   Lab Results  Component Value Date   HDL 71 12/10/2019   Lab Results  Component Value Date   LDLCALC 73 12/10/2019   Lab Results  Component Value Date   TRIG 33 12/10/2019   Lab Results  Component Value Date   CHOLHDL 2.2 12/10/2019   Lab Results  Component Value Date   HGBA1C 5.2 05/18/2022      Assessment & Plan:   Problem List Items Addressed This Visit       Digestive   Hematemesis - Primary    Resolved at present.        Other   Alcohol use disorder    Encourage patient to stop drinking. Education provided regarding the harmful effects of drinking alcohol/beer        Meds ordered this encounter  Medications   moxifloxacin (VIGAMOX) 0.5 % ophthalmic solution    Sig: Place 1 drop into both eyes 3 (three) times daily.    Dispense:  3 mL    Refill:  0     Follow-up: No follow-ups on file.    Theresia Lo, NP

## 2022-08-03 ENCOUNTER — Inpatient Hospital Stay: Payer: Medicaid Other | Attending: Oncology

## 2022-08-03 ENCOUNTER — Inpatient Hospital Stay: Payer: Medicaid Other

## 2022-08-03 DIAGNOSIS — C9201 Acute myeloblastic leukemia, in remission: Secondary | ICD-10-CM | POA: Diagnosis not present

## 2022-08-03 LAB — CBC WITH DIFFERENTIAL/PLATELET
Abs Immature Granulocytes: 0 10*3/uL (ref 0.00–0.07)
Basophils Absolute: 0 10*3/uL (ref 0.0–0.1)
Basophils Relative: 1 %
Eosinophils Absolute: 0.1 10*3/uL (ref 0.0–0.5)
Eosinophils Relative: 2 %
HCT: 42.5 % (ref 39.0–52.0)
Hemoglobin: 14 g/dL (ref 13.0–17.0)
Immature Granulocytes: 0 %
Lymphocytes Relative: 23 %
Lymphs Abs: 0.8 10*3/uL (ref 0.7–4.0)
MCH: 30.4 pg (ref 26.0–34.0)
MCHC: 32.9 g/dL (ref 30.0–36.0)
MCV: 92.2 fL (ref 80.0–100.0)
Monocytes Absolute: 0.3 10*3/uL (ref 0.1–1.0)
Monocytes Relative: 8 %
Neutro Abs: 2.2 10*3/uL (ref 1.7–7.7)
Neutrophils Relative %: 66 %
Platelets: 144 10*3/uL — ABNORMAL LOW (ref 150–400)
RBC: 4.61 MIL/uL (ref 4.22–5.81)
RDW: 13.4 % (ref 11.5–15.5)
WBC: 3.4 10*3/uL — ABNORMAL LOW (ref 4.0–10.5)
nRBC: 0 % (ref 0.0–0.2)

## 2022-08-03 LAB — COMPREHENSIVE METABOLIC PANEL
ALT: 37 U/L (ref 0–44)
AST: 31 U/L (ref 15–41)
Albumin: 4.1 g/dL (ref 3.5–5.0)
Alkaline Phosphatase: 66 U/L (ref 38–126)
Anion gap: 8 (ref 5–15)
BUN: 19 mg/dL (ref 6–20)
CO2: 23 mmol/L (ref 22–32)
Calcium: 9 mg/dL (ref 8.9–10.3)
Chloride: 105 mmol/L (ref 98–111)
Creatinine, Ser: 1.36 mg/dL — ABNORMAL HIGH (ref 0.61–1.24)
GFR, Estimated: >60 mL/min (ref >60)
Glucose, Bld: 83 mg/dL (ref 70–99)
Potassium: 3.7 mmol/L (ref 3.5–5.1)
Sodium: 136 mmol/L (ref 135–145)
Total Bilirubin: 0.9 mg/dL (ref 0.3–1.2)
Total Protein: 7.2 g/dL (ref 6.5–8.1)

## 2022-08-10 DIAGNOSIS — F1090 Alcohol use, unspecified, uncomplicated: Secondary | ICD-10-CM | POA: Insufficient documentation

## 2022-08-10 NOTE — Assessment & Plan Note (Signed)
Encourage patient to stop drinking. Education provided regarding the harmful effects of drinking alcohol/beer

## 2022-08-10 NOTE — Assessment & Plan Note (Signed)
Resolved at present.

## 2022-08-14 ENCOUNTER — Encounter: Payer: Self-pay | Admitting: Hematology and Oncology

## 2022-08-29 ENCOUNTER — Other Ambulatory Visit: Payer: Self-pay

## 2022-08-29 ENCOUNTER — Telehealth: Payer: Self-pay | Admitting: *Deleted

## 2022-08-29 MED ORDER — PROCHLORPERAZINE MALEATE 10 MG PO TABS: 10.0000 mg | ORAL_TABLET | Freq: Four times a day (QID) | ORAL | 0 refills | Status: DC | PRN

## 2022-08-29 MED ORDER — VALACYCLOVIR HCL 500 MG PO TABS: 500.0000 mg | ORAL_TABLET | Freq: Every day | ORAL | 0 refills | Status: DC

## 2022-08-29 NOTE — Telephone Encounter (Signed)
Received a update and medicine rec request from [place] wanting to know when patient can return to work

## 2022-08-29 NOTE — Telephone Encounter (Signed)
Patient called requesting a refill for the medications listed.  Compazine Viatrex

## 2022-08-29 NOTE — Telephone Encounter (Signed)
Pended to Dr. Janese Banks for review, thx

## 2022-08-31 ENCOUNTER — Encounter: Payer: Self-pay | Admitting: Hematology and Oncology

## 2022-09-03 ENCOUNTER — Telehealth: Payer: Self-pay | Admitting: *Deleted

## 2022-09-03 ENCOUNTER — Inpatient Hospital Stay: Payer: Medicare Other | Attending: Oncology

## 2022-09-03 ENCOUNTER — Telehealth: Payer: Self-pay

## 2022-09-03 ENCOUNTER — Inpatient Hospital Stay: Payer: Medicare Other

## 2022-09-03 DIAGNOSIS — R1013 Epigastric pain: Secondary | ICD-10-CM | POA: Insufficient documentation

## 2022-09-03 DIAGNOSIS — D701 Agranulocytosis secondary to cancer chemotherapy: Secondary | ICD-10-CM | POA: Diagnosis not present

## 2022-09-03 DIAGNOSIS — C9201 Acute myeloblastic leukemia, in remission: Secondary | ICD-10-CM | POA: Insufficient documentation

## 2022-09-03 LAB — COMPREHENSIVE METABOLIC PANEL
ALT: 48 U/L — ABNORMAL HIGH (ref 0–44)
AST: 26 U/L (ref 15–41)
Albumin: 4.2 g/dL (ref 3.5–5.0)
Alkaline Phosphatase: 81 U/L (ref 38–126)
Anion gap: 8 (ref 5–15)
BUN: 16 mg/dL (ref 6–20)
CO2: 24 mmol/L (ref 22–32)
Calcium: 9 mg/dL (ref 8.9–10.3)
Chloride: 105 mmol/L (ref 98–111)
Creatinine, Ser: 1.41 mg/dL — ABNORMAL HIGH (ref 0.61–1.24)
GFR, Estimated: >60 mL/min (ref >60)
Glucose, Bld: 112 mg/dL — ABNORMAL HIGH (ref 70–99)
Potassium: 3.8 mmol/L (ref 3.5–5.1)
Sodium: 137 mmol/L (ref 135–145)
Total Bilirubin: 0.9 mg/dL (ref 0.3–1.2)
Total Protein: 6.8 g/dL (ref 6.5–8.1)

## 2022-09-03 LAB — CBC WITH DIFFERENTIAL/PLATELET
Abs Immature Granulocytes: 0.01 10*3/uL (ref 0.00–0.07)
Basophils Absolute: 0.1 10*3/uL (ref 0.0–0.1)
Basophils Relative: 3 %
Eosinophils Absolute: 0.1 10*3/uL (ref 0.0–0.5)
Eosinophils Relative: 8 %
HCT: 41.6 % (ref 39.0–52.0)
Hemoglobin: 14.1 g/dL (ref 13.0–17.0)
Immature Granulocytes: 1 %
Lymphocytes Relative: 54 %
Lymphs Abs: 1 10*3/uL (ref 0.7–4.0)
MCH: 30.6 pg (ref 26.0–34.0)
MCHC: 33.9 g/dL (ref 30.0–36.0)
MCV: 90.2 fL (ref 80.0–100.0)
Monocytes Absolute: 0.1 10*3/uL (ref 0.1–1.0)
Monocytes Relative: 6 %
Neutro Abs: 0.5 10*3/uL — ABNORMAL LOW (ref 1.7–7.7)
Neutrophils Relative %: 28 %
Platelets: 202 10*3/uL (ref 150–400)
RBC: 4.61 MIL/uL (ref 4.22–5.81)
RDW: 13.2 % (ref 11.5–15.5)
WBC: 1.9 10*3/uL — ABNORMAL LOW (ref 4.0–10.5)
nRBC: 0 % (ref 0.0–0.2)

## 2022-09-03 NOTE — Telephone Encounter (Addendum)
Pt c/o to lab personnel that he had "Stomach pains" x 6-7 days. He refused to wait to be worked in for [employer] scheduled. I called patient to get more details to his concerns. Pt stated that Located in the epigastric region. Feels like heartburn. Pain comes and goes. Pt denies any vomiting. No constipation, denies any fevers, shortness of breath or chest pain. Patient has not taking any nausea medication or acid reflux meds. He denies any exposure. I asked patient if he had taken anything for reflux. He stated that he used his sister's medication once but doesn't recall what the medication was. He denies any other complaints. He is not a good historian to medical care. He had labs drawn today which showed neutropenia. I discussed neutropenic precautions with the patient over a 15 mins conversation. He stated that he understood. His grand baby has a cold and he wanted to see the baby. I explained to him that given his low blood counts that this would not be advisable as he may get the 'cold' as well given his low immune system.  Pt added to Lauren's schedule tomorrow at 130 pm. Pt requires transportation to his apt. He is uncertain that his family can take him to the apt. I will inquire if [facility] transportation is available.

## 2022-09-03 NOTE — Telephone Encounter (Signed)
Received a voicemail from someone at [facility] stating they still have an outstanding request for records. Attempted to call number back [phone]) all I got was people selling ads. Called patient to verify number to [facility], was given the correct number. Contacted [employer], states they received the request on 08/29/22 and do not have any other requests out for these records.

## 2022-09-03 NOTE — Telephone Encounter (Signed)
Pt notified that [facility] transportation can pick up patient.

## 2022-09-04 ENCOUNTER — Inpatient Hospital Stay: Payer: Medicare Other

## 2022-09-04 ENCOUNTER — Inpatient Hospital Stay: Payer: Medicare Other | Admitting: Nurse Practitioner

## 2022-09-04 ENCOUNTER — Inpatient Hospital Stay (HOSPITAL_BASED_OUTPATIENT_CLINIC_OR_DEPARTMENT_OTHER): Payer: Medicare Other | Admitting: Nurse Practitioner

## 2022-09-04 ENCOUNTER — Encounter: Payer: Self-pay | Admitting: Nurse Practitioner

## 2022-09-04 VITALS — BP 129/83 | HR 74 | Temp 96.9°F | Resp 17 | Wt 213.0 lb

## 2022-09-04 DIAGNOSIS — D701 Agranulocytosis secondary to cancer chemotherapy: Secondary | ICD-10-CM

## 2022-09-04 DIAGNOSIS — R1013 Epigastric pain: Secondary | ICD-10-CM | POA: Diagnosis not present

## 2022-09-04 DIAGNOSIS — T451X5A Adverse effect of antineoplastic and immunosuppressive drugs, initial encounter: Secondary | ICD-10-CM

## 2022-09-04 DIAGNOSIS — C9201 Acute myeloblastic leukemia, in remission: Secondary | ICD-10-CM

## 2022-09-04 DIAGNOSIS — Z79899 Other long term (current) drug therapy: Secondary | ICD-10-CM

## 2022-09-04 MED ORDER — OMEPRAZOLE 20 MG PO CPDR: 20.0000 mg | DELAYED_RELEASE_CAPSULE | Freq: Every day | ORAL | 0 refills | Status: DC

## 2022-09-04 NOTE — Progress Notes (Incomplete)
Symptom Management [place] at [place]. [facility] [address] [place], [place] [address] [phone] (phone) [phone] (fax)  Patient Care Team: Theresia Lo, NP as PCP - General (Nurse Practitioner) [name], MD as Consulting Physician (Oncology)   Name of the patient: Carl [name]  [id number]  July 01, 1976   Date of visit: 09/04/22  Diagnosis- AML, in remission  Chief complaint/ Reason for visit- abdominal pain & burning  Heme/Onc history:  Oncology History   No history exists.    Interval history- Carl [name]. Is a 47 y.o. male diagnosed with AML, on oral vidaza, currently in remission, who presents to [facility] for complaints of abdominal pain.   On 07/23/22 he presented to ER with report of intermittent nausea and vomiting, and hematemesis thought to be secondary to heavy alcohol use. He underwent EGD with Dr. Loron Weimer Norris on 07/24/22 which revealed normal esophagus, stomach, and duodenum. Hematemesis was thought to be secondary to Mallory-Weiss tear d/t vomiting.   He called clinic complaining of abdominal pain x 1 week localized to epigastric region. Says it's similar to acid reflux. Symptoms have now resolved. Unsure of anything that makes symptoms better or worse. Symptoms present when he takes vidaza and when he doesn't.      ECOG FS:{CHL ONC [id number]}  Review of systems- ROS   Current treatment- ***  Allergies  Allergen Reactions  . Dapsone     Contraindication - G6PD deficient  . Primaquine     Contraindication - G6PD deficient  . Rasburicase     Contraindication - G6PD deficient   Past Medical History:  Diagnosis Date  . 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 Agranulocytosis secondary to cancer chemotherapy (CODE) ([place])   . Chemotherapy induced neutropenia ([place])   . Pancreatitis   . Pancytopenia ([place])   .  Thrombocytopenia ([place])   . Tobacco use disorder    Past Surgical History:  Procedure Laterality Date  . BONE MARROW BIOPSY    . ESOPHAGOGASTRODUODENOSCOPY (EGD) WITH PROPOFOL N/A 07/24/2022   Procedure: ESOPHAGOGASTRODUODENOSCOPY (EGD) WITH PROPOFOL;  Surgeon: [name], MD;  Location: [facility] ENDOSCOPY;  Service: Endoscopy;  Laterality: N/A;   Social History   Socioeconomic History  . Marital status: Single    Spouse name: Not on file  . Number of children: Not on file  . Years of education: Not on file  . Highest education level: Not on file  Occupational History  . Not on file  Tobacco Use  . Smoking status: Former    Packs/day: 1.00    Years: 17.00    Total pack years: 17.00    Types: Cigarettes    Quit date: 03/27/2020    Years since quitting: 2.4  . Smokeless tobacco: Never  Vaping Use  . Vaping Use: Never used  Substance and Sexual Activity  . Alcohol use: Yes    Alcohol/week: 7.0 standard drinks of alcohol    Types: 7 Cans of beer per week    Comment: Socially  . Drug use: Not Currently  . Sexual activity: Yes  Other Topics Concern  . Not on file  Social History Narrative  . Not on file   Social Determinants of Health   Financial Resource Strain: Not on file  Food Insecurity: Not on file  Transportation Needs: No Transportation Needs (02/05/2022)   PRAPARE - Transportation   . Lack of Transportation (Medical): No   .  Lack of Transportation (Non-Medical): No  Physical Activity: Not on file  Stress: Not on file  Social Connections: Not on file  Intimate Partner Violence: Not on file   Family History  Problem Relation Age of Onset  . Hypertension Mother   . Diabetes Mother   . Diabetes Father   . Prostate cancer Neg Hx   . Kidney cancer Neg Hx   . Bladder Cancer Neg Hx     Current Outpatient Medications:  .  azaCITIDine (ONUREG) 300 MG tablet, Take 1 tablet (300 mg total) by mouth daily. Take for 14 days, hold for 14 days. Repeat every 28 days.,  Disp: 14 tablet, Rfl: 6 .  diclofenac Sodium (VOLTAREN) 1 % GEL, Apply 2 g topically 4 (four) times daily., Disp: 100 g, Rfl: 1 .  glipiZIDE (GLUCOTROL) 5 MG tablet, Take 1 tablet (5 mg total) by mouth daily., Disp: 90 tablet, Rfl: 1 .  miconazole (MICOTIN) 2 % cream, Apply 1 Application topically 2 (two) times daily., Disp: 28.35 g, Rfl: 0 .  moxifloxacin (VIGAMOX) 0.5 % ophthalmic solution, Place 1 drop into both eyes 3 (three) times daily., Disp: 3 mL, Rfl: 0 .  ondansetron (ZOFRAN) 8 MG tablet, SMARTSIG:1 Tablet(s) By Mouth Every 12 Hours PRN, Disp: , Rfl:  .  prochlorperazine (COMPAZINE) 10 MG tablet, Take 1 tablet (10 mg total) by mouth every 6 (six) hours as needed for nausea or vomiting., Disp: 20 tablet, Rfl: 0 .  valACYclovir (VALTREX) 500 MG tablet, Take 1 tablet (500 mg total) by mouth daily., Disp: 30 tablet, Rfl: 0  Physical exam:  Vitals:   09/04/22 0919  BP: 129/83  Pulse: 74  Resp: 17  Temp: (!) 96.9 F (36.1 C)  SpO2: 100%  Weight: 213 lb (96.6 kg)   Physical Exam      Latest Ref Rng & Units 09/03/2022    2:48 PM  CMP  Glucose 70 - 99 mg/dL 112   BUN 6 - 20 mg/dL 16   Creatinine 0.61 - 1.24 mg/dL 1.41   Sodium 135 - 145 mmol/L 137   Potassium 3.5 - 5.1 mmol/L 3.8   Chloride 98 - 111 mmol/L 105   CO2 22 - 32 mmol/L 24   Calcium 8.9 - 10.3 mg/dL 9.0   Total Protein 6.5 - 8.1 g/dL 6.8   Total Bilirubin 0.3 - 1.2 mg/dL 0.9   Alkaline Phos 38 - 126 U/L 81   AST 15 - 41 U/L 26   ALT 0 - 44 U/L 48       Latest Ref Rng & Units 09/03/2022    2:48 PM  CBC  WBC 4.0 - 10.5 K/uL 1.9   Hemoglobin 13.0 - 17.0 g/dL 14.1   Hematocrit 39.0 - 52.0 % 41.6   Platelets 150 - 400 K/uL 202    No images are attached to the encounter.  No results found.  Assessment and plan- Patient is a 47 y.o. male ***   Visit Diagnosis No diagnosis found.  Patient expressed understanding and was in agreement with this plan. He also understands that He can call clinic at any time with  any questions, concerns, or complaints.   Thank you for allowing me to participate in the care of this very pleasant patient.   Beckey Rutter, DNP, AGNP-C [place] [facility] [place]  CC:

## 2022-09-04 NOTE — Progress Notes (Unsigned)
Patient here for oncology follow-up appointment, ,patient states upper abdominal up to a 2 (constant) over 1 week. Eating certain foods makes it worse and nothing makes in better

## 2022-09-05 ENCOUNTER — Telehealth: Payer: Self-pay

## 2022-09-05 ENCOUNTER — Encounter: Payer: Self-pay | Admitting: Hematology and Oncology

## 2022-09-05 NOTE — Addendum Note (Signed)
Addended by: Verlon Au on: 09/05/2022 04:21 PM   Modules accepted: Orders

## 2022-09-05 NOTE — Telephone Encounter (Signed)
Patient had office visit yesterday, which showed an [place] of 0.5. Called patient to get clarification on oral azacitidine schedule for future dose modification. Patient did not answer, left a voicemail asking to return call.   Since his [place] was 500 yesterday, if he is on Day 1 of azacitidine, it may be considered to hold until [facility] is greater than 500.

## 2022-09-05 NOTE — Telephone Encounter (Signed)
Patient returned phone call and confirmed that he has been taking azacitidine on a 2 week on / 2 week off schedule and that he was currently on the off cycle. He just received a shipment of azacitidine from the pharmacy and was planning to start today (09/05/2022).   I spoke with Ander Purpura, NP and updated her that Mr. Rossetti was on schedule. With an [place] of 500, holding his medication until recovery is an option. She will consult with Dr. Janese Banks and let the patient know if he needs to hold his azacitidine or

## 2022-09-11 ENCOUNTER — Encounter: Payer: Self-pay | Admitting: Hematology and Oncology

## 2022-09-18 ENCOUNTER — Inpatient Hospital Stay: Payer: Medicare Other

## 2022-09-18 ENCOUNTER — Telehealth: Payer: Self-pay | Admitting: *Deleted

## 2022-09-18 DIAGNOSIS — C9201 Acute myeloblastic leukemia, in remission: Secondary | ICD-10-CM | POA: Diagnosis not present

## 2022-09-18 DIAGNOSIS — D701 Agranulocytosis secondary to cancer chemotherapy: Secondary | ICD-10-CM

## 2022-09-18 LAB — CBC WITH DIFFERENTIAL/PLATELET
Abs Immature Granulocytes: 0.02 10*3/uL (ref 0.00–0.07)
Basophils Absolute: 0.1 10*3/uL (ref 0.0–0.1)
Basophils Relative: 2 %
Eosinophils Absolute: 0.1 10*3/uL (ref 0.0–0.5)
Eosinophils Relative: 2 %
HCT: 42.9 % (ref 39.0–52.0)
Hemoglobin: 14.5 g/dL (ref 13.0–17.0)
Immature Granulocytes: 1 %
Lymphocytes Relative: 41 %
Lymphs Abs: 1.1 10*3/uL (ref 0.7–4.0)
MCH: 30.7 pg (ref 26.0–34.0)
MCHC: 33.8 g/dL (ref 30.0–36.0)
MCV: 90.7 fL (ref 80.0–100.0)
Monocytes Absolute: 0.3 10*3/uL (ref 0.1–1.0)
Monocytes Relative: 12 %
Neutro Abs: 1.2 10*3/uL — ABNORMAL LOW (ref 1.7–7.7)
Neutrophils Relative %: 42 %
Platelets: 160 10*3/uL (ref 150–400)
RBC: 4.73 MIL/uL (ref 4.22–5.81)
RDW: 12.6 % (ref 11.5–15.5)
WBC: 2.8 10*3/uL — ABNORMAL LOW (ref 4.0–10.5)
nRBC: 0 % (ref 0.0–0.2)

## 2022-09-18 NOTE — Telephone Encounter (Signed)
Pt wanted to know about his counts so that he would know if he can start back on his oral vidaza. The wbc 2.8 and aNC 1.2 and he will start back tom. Wed. 2 weeks on and 2 weeks off. Pt. Has meds and understands the above

## 2022-09-26 ENCOUNTER — Other Ambulatory Visit: Payer: Self-pay | Admitting: Nurse Practitioner

## 2022-09-26 ENCOUNTER — Other Ambulatory Visit: Payer: Self-pay | Admitting: Oncology

## 2022-09-27 ENCOUNTER — Encounter: Payer: Self-pay | Admitting: Hematology and Oncology

## 2022-10-02 ENCOUNTER — Inpatient Hospital Stay (HOSPITAL_BASED_OUTPATIENT_CLINIC_OR_DEPARTMENT_OTHER): Payer: Medicare Other | Admitting: Oncology

## 2022-10-02 ENCOUNTER — Inpatient Hospital Stay: Payer: Medicare Other

## 2022-10-02 ENCOUNTER — Other Ambulatory Visit: Payer: Self-pay | Admitting: *Deleted

## 2022-10-02 ENCOUNTER — Encounter: Payer: Self-pay | Admitting: Oncology

## 2022-10-02 ENCOUNTER — Telehealth: Payer: Self-pay | Admitting: *Deleted

## 2022-10-02 ENCOUNTER — Inpatient Hospital Stay: Payer: Medicare Other | Attending: Oncology

## 2022-10-02 VITALS — BP 128/88 | HR 90 | Temp 97.8°F | Resp 19 | Wt 210.0 lb

## 2022-10-02 DIAGNOSIS — Z79899 Other long term (current) drug therapy: Secondary | ICD-10-CM

## 2022-10-02 DIAGNOSIS — R1013 Epigastric pain: Secondary | ICD-10-CM

## 2022-10-02 DIAGNOSIS — N529 Male erectile dysfunction, unspecified: Secondary | ICD-10-CM | POA: Diagnosis not present

## 2022-10-02 DIAGNOSIS — C9201 Acute myeloblastic leukemia, in remission: Secondary | ICD-10-CM | POA: Insufficient documentation

## 2022-10-02 DIAGNOSIS — Z87891 Personal history of nicotine dependence: Secondary | ICD-10-CM | POA: Diagnosis not present

## 2022-10-02 DIAGNOSIS — N5319 Other ejaculatory dysfunction: Secondary | ICD-10-CM

## 2022-10-02 DIAGNOSIS — Z79624 Long term (current) use of inhibitors of nucleotide synthesis: Secondary | ICD-10-CM | POA: Diagnosis not present

## 2022-10-02 DIAGNOSIS — R112 Nausea with vomiting, unspecified: Secondary | ICD-10-CM | POA: Diagnosis not present

## 2022-10-02 DIAGNOSIS — T50905A Adverse effect of unspecified drugs, medicaments and biological substances, initial encounter: Secondary | ICD-10-CM

## 2022-10-02 LAB — CBC WITH DIFFERENTIAL/PLATELET
Abs Immature Granulocytes: 0.02 10*3/uL (ref 0.00–0.07)
Basophils Absolute: 0 10*3/uL (ref 0.0–0.1)
Basophils Relative: 1 %
Eosinophils Absolute: 0 10*3/uL (ref 0.0–0.5)
Eosinophils Relative: 1 %
HCT: 44.3 % (ref 39.0–52.0)
Hemoglobin: 14.7 g/dL (ref 13.0–17.0)
Immature Granulocytes: 1 %
Lymphocytes Relative: 29 %
Lymphs Abs: 1.3 10*3/uL (ref 0.7–4.0)
MCH: 30.3 pg (ref 26.0–34.0)
MCHC: 33.2 g/dL (ref 30.0–36.0)
MCV: 91.3 fL (ref 80.0–100.0)
Monocytes Absolute: 0.3 10*3/uL (ref 0.1–1.0)
Monocytes Relative: 8 %
Neutro Abs: 2.7 10*3/uL (ref 1.7–7.7)
Neutrophils Relative %: 60 %
Platelets: 150 10*3/uL (ref 150–400)
RBC: 4.85 MIL/uL (ref 4.22–5.81)
RDW: 13 % (ref 11.5–15.5)
WBC: 4.4 10*3/uL (ref 4.0–10.5)
nRBC: 0 % (ref 0.0–0.2)

## 2022-10-02 LAB — COMPREHENSIVE METABOLIC PANEL
ALT: 47 U/L — ABNORMAL HIGH (ref 0–44)
AST: 25 U/L (ref 15–41)
Albumin: 4.4 g/dL (ref 3.5–5.0)
Alkaline Phosphatase: 69 U/L (ref 38–126)
Anion gap: 8 (ref 5–15)
BUN: 15 mg/dL (ref 6–20)
CO2: 27 mmol/L (ref 22–32)
Calcium: 9.2 mg/dL (ref 8.9–10.3)
Chloride: 106 mmol/L (ref 98–111)
Creatinine, Ser: 1.24 mg/dL (ref 0.61–1.24)
GFR, Estimated: >60 mL/min (ref >60)
Glucose, Bld: 68 mg/dL — ABNORMAL LOW (ref 70–99)
Potassium: 4.1 mmol/L (ref 3.5–5.1)
Sodium: 141 mmol/L (ref 135–145)
Total Bilirubin: 0.8 mg/dL (ref 0.3–1.2)
Total Protein: 7.3 g/dL (ref 6.5–8.1)

## 2022-10-02 MED ORDER — ONDANSETRON HCL 4 MG PO TABS: 4.0000 mg | ORAL_TABLET | Freq: Three times a day (TID) | ORAL | 0 refills | Status: DC | PRN

## 2022-10-02 NOTE — Addendum Note (Signed)
Addended by: Luella Cook on: 10/02/2022 02:30 PM   Modules accepted: Orders

## 2022-10-02 NOTE — Progress Notes (Addendum)
Hematology/Oncology Consult note [facility]  [phone] [phone]  Patient Care Team: Theresia Lo, NP as PCP - General (Nurse Practitioner) [name], MD as Consulting Physician (Oncology)   Name of the patient: Carl [name]  [id number]  1975/09/26   Date of visit: 10/02/22  Diagnosis- history of AML in remission    Chief complaint/ Reason for visit-acute visit for ongoing nausea  Heme/Onc history: Carl [name]. is a 47 y.o. 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 Gene Coding Predicted Protein Variant allele fraction  U2AF1 c.101C>T p.(Ser34Phe) 12.9 %  RUNX1 c.620_621insATCCCCCG p.(Gln208Serfs*6) 6.7 %  NRAS c.38G>A p.(Gly13Asp) 9.2 %   Variants of Unknown Clinical Significance:  Gene Coding Predicted Protein Variant allele fraction  TET2 c.4493G>A p.(Arg1498His) 48.2 %  ETV6 c.776G>A p.(Arg259Gln) 49.1 %   Pertinent Phenotypic data: blasts express CD7, CD13, CD34, CD38, CD71, CD117, and HLA-DR.   He received cytarabine and daunorubicin (7+3) beginning 04/13/2020 and 05/27/2020.  Course has been complicated by fever and neutropenia with mucositis (04/27/2020) and gluteal cleft cellulitis (05/06/2020).Received induction with 7+3+HD with delayed count recovery. D42 BMBx revealed persistent disease and patient re-induced with 7+3.  He then went on to receive consolidation therapy with HiDAC and cycle 4 received on 11/04/2020.  He follows up with Dr. Royce Macadamia at [facility]. Last bone marrow biopsy on 12/05/2020 which showed hypercellular bone marrow with trilineage hematopoiesis and 3% blasts by manual differential.   He is presently on azacitidine p.o. 300 mg daily 2 weeks on 2 weeks off.      Interval history-patient states that he gets nauseous whenever he takes oral Vidaza.  He does not have any significant nausea or vomiting when he is not on Vidaza.  Previously patient was taking Zofran 30 minutes  prior to his [place] which was working well for him.  Recently he is lost insurance and was not able to afford Zofran for the last 2 months and was switched to Compazine.  Compazine has not been working as well for him and does not control his nausea well.  Sometimes nausea is associated with significant retching and vomiting and he has occasionally seen specks of blood when he vomits.  He has been able to keep up with his oral intake.  Patient also reports difficulty with maintaining erection and ejaculation  ECOG PS- 0 Pain scale- 0   Review of systems- Review of Systems  Constitutional:  Negative for chills, fever, malaise/fatigue and weight loss.  HENT:  Negative for congestion, ear discharge and nosebleeds.   Eyes:  Negative for blurred vision.  Respiratory:  Negative for cough, hemoptysis, sputum production, shortness of breath and wheezing.   Cardiovascular:  Negative for chest pain, palpitations, orthopnea and claudication.  Gastrointestinal:  Positive for abdominal pain and vomiting. Negative for blood in stool, constipation, diarrhea, heartburn, melena and nausea.  Genitourinary:  Negative for dysuria, flank pain, frequency, hematuria and urgency.  Musculoskeletal:  Negative for back pain, joint pain and myalgias.  Skin:  Negative for rash.  Neurological:  Negative for dizziness, tingling, focal weakness, seizures, weakness and headaches.  Endo/Heme/Allergies:  Does not bruise/bleed easily.  Psychiatric/Behavioral:  Negative for depression and suicidal ideas. The patient does not have insomnia.       Allergies  Allergen Reactions   Dapsone     Contraindication - G6PD deficient   Primaquine     Contraindication - G6PD deficient   Rasburicase     Contraindication - G6PD deficient  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[place])    Agranulocytosis secondary to cancer chemotherapy (CODE) ([place])    Chemotherapy induced neutropenia (HCC)     Pancreatitis    Pancytopenia (HCC)    Thrombocytopenia (HCC)    Tobacco use disorder      Past Surgical History:  Procedure Laterality Date   BONE MARROW BIOPSY     ESOPHAGOGASTRODUODENOSCOPY (EGD) WITH PROPOFOL N/A 07/24/2022   Procedure: ESOPHAGOGASTRODUODENOSCOPY (EGD) WITH PROPOFOL;  Surgeon: [name], MD;  Location: [facility] ENDOSCOPY;  Service: Endoscopy;  Laterality: N/A;    Social History   Socioeconomic History   Marital status: Single    Spouse name: Not on file   Number of children: Not on file   Years of education: Not on file   Highest education level: Not on file  Occupational History   Not on file  Tobacco Use   Smoking status: Former    Packs/day: 1.00    Years: 17.00    Total pack years: 17.00    Types: Cigarettes    Quit date: 03/27/2020    Years since quitting: 2.5   Smokeless tobacco: Never  Vaping Use   Vaping Use: Never used  Substance and Sexual Activity   Alcohol use: Yes    Alcohol/week: 7.0 standard drinks of alcohol    Types: 7 Cans of beer per week    Comment: Socially   Drug use: Not Currently   Sexual activity: Yes  Other Topics Concern   Not on file  Social History Narrative   Not on file   Social Determinants of Health   Financial Resource Strain: Not on file  Food Insecurity: Not on file  Transportation Needs: Unmet Transportation Needs (09/18/2022)   PRAPARE - Hydrologist (Medical): Yes    Lack of Transportation (Non-Medical): Yes  Physical Activity: Not on file  Stress: Not on file  Social Connections: Not on file  Intimate Partner Violence: Not on file    Family History  Problem Relation Age of Onset   Hypertension Mother    Diabetes Mother    Diabetes Father    Prostate cancer Neg Hx    Kidney cancer Neg Hx    Bladder Cancer Neg Hx      Current Outpatient Medications:    azaCITIDine (ONUREG) 300 MG tablet, Take 1 tablet (300 mg total) by mouth daily. Take for 14 days, hold for 14  days. Repeat every 28 days., Disp: 14 tablet, Rfl: 6   diclofenac Sodium (VOLTAREN) 1 % GEL, Apply 2 g topically 4 (four) times daily., Disp: 100 g, Rfl: 1   glipiZIDE (GLUCOTROL) 5 MG tablet, Take 1 tablet (5 mg total) by mouth daily., Disp: 90 tablet, Rfl: 1   miconazole (MICOTIN) 2 % cream, Apply 1 Application topically 2 (two) times daily., Disp: 28.35 g, Rfl: 0   moxifloxacin (VIGAMOX) 0.5 % ophthalmic solution, Place 1 drop into both eyes 3 (three) times daily., Disp: 3 mL, Rfl: 0   omeprazole (PRILOSEC) 20 MG capsule, Take 1 capsule (20 mg total) by mouth daily., Disp: 30 capsule, Rfl: 0   ondansetron (ZOFRAN) 4 MG tablet, Take 1 tablet (4 mg total) by mouth every 8 (eight) hours as needed for nausea or vomiting., Disp: 60 tablet, Rfl: 0   prochlorperazine (COMPAZINE) 10 MG tablet, Take 1 tablet (10 mg total) by mouth every 6 (six) hours as needed for nausea or vomiting., Disp: 20 tablet,  Rfl: 0   valACYclovir (VALTREX) 500 MG tablet, TAKE 1 TABLET (500 MG TOTAL) BY MOUTH DAILY., Disp: 90 tablet, Rfl: 1  Physical exam:  Vitals:   10/02/22 1345  BP: 128/88  Pulse: 90  Resp: 19  Temp: 97.8 F (36.6 C)  SpO2: 98%  Weight: 210 lb (95.3 kg)   Physical Exam Constitutional:      General: He is not in acute distress. Cardiovascular:     Rate and Rhythm: Normal rate and regular rhythm.     Heart sounds: Normal heart sounds.  Pulmonary:     Effort: Pulmonary effort is normal.     Breath sounds: Normal breath sounds.  Abdominal:     General: Bowel sounds are normal.     Palpations: Abdomen is soft.  Skin:    General: Skin is warm and dry.  Neurological:     Mental Status: He is alert and oriented to person, place, and time.         Latest Ref Rng & Units 10/02/2022    1:27 PM  CMP  Glucose 70 - 99 mg/dL 68   BUN 6 - 20 mg/dL 15   Creatinine 0.61 - 1.24 mg/dL 1.24   Sodium 135 - 145 mmol/L 141   Potassium 3.5 - 5.1 mmol/L 4.1   Chloride 98 - 111 mmol/L 106   CO2 22 - 32  mmol/L 27   Calcium 8.9 - 10.3 mg/dL 9.2   Total Protein 6.5 - 8.1 g/dL 7.3   Total Bilirubin 0.3 - 1.2 mg/dL 0.8   Alkaline Phos 38 - 126 U/L 69   AST 15 - 41 U/L 25   ALT 0 - 44 U/L 47       Latest Ref Rng & Units 10/02/2022    1:27 PM  CBC  WBC 4.0 - 10.5 K/uL 4.4   Hemoglobin 13.0 - 17.0 g/dL 14.7   Hematocrit 39.0 - 52.0 % 44.3   Platelets 150 - 400 K/uL 150     No images are attached to the encounter.  No results found.   Assessment and plan- Patient is a 46 y.o. male with history of AML on oral Vidaza here for an acute visit for ongoing nausea  As per history it appears that his nausea is triggered by Vidaza.  Patient is not on right is that he has not had any significant nausea or vomiting.  Compazine has not been working for him.  He is willing to pay out-of-pocket for Zofran we are sending 4 mg daily as needed Zofran for him.  If it is prohibitively expensive for him to buy Zofran we will consider switching him to a nightly dose of olanzapine which she can take for the 2 weeks when he is on Vidaza.  Counts are presently unremarkable and no overt concern that his leukemia is recurring at this time.  He has always had mildly abnormal AST ALT which we will continue to monitor  Patient presently has 1 more week provide is a left for this cycle which he will complete as planned.  He will come back end of February to get repeat labs before he starts his next cycle and I will see him back in 2 months  Patient reports difficulty with maintaining erection as well as ejaculation.  I will refer him to urology for this   Visit Diagnosis 1. Drug-induced nausea and vomiting   2. High risk medication use   3. Acute myeloid leukemia in remission ([place])  Dr. Randa Evens, MD, MPH [facility] [phone] 10/02/2022 2:17 PM

## 2022-10-02 NOTE — Addendum Note (Signed)
Addended by: Luella Cook on: 10/02/2022 07:10 PM   Modules accepted: Orders

## 2022-10-02 NOTE — Telephone Encounter (Signed)
Patient reports that he has been vomiting since 2 AM, liquid and a little bit of blood at 5 AM, dark in color. The compazine does not agree with him and makes him sicker. He states that he just finished vomiting . He states that he does not feel he has food poisoning. He had only had broth. Reports that he has a headache after vomiting. He said that he has an appointment tomorrow. 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 Please advise.

## 2022-10-02 NOTE — Telephone Encounter (Signed)
I have called patient back and got voice mail and left message that Dr Janese Banks wants to see him at 77 today. I also contacted S Hester to see if the [facility] can pick patient upif he is unable to get other transportation and she said we can pick him up if needed. I am awaiting a return call from Carl [name]

## 2022-10-02 NOTE — Telephone Encounter (Signed)
Yes possibly

## 2022-10-03 ENCOUNTER — Ambulatory Visit: Payer: No Typology Code available for payment source | Admitting: Urology

## 2022-10-03 ENCOUNTER — Inpatient Hospital Stay: Payer: Medicare Other

## 2022-10-03 ENCOUNTER — Inpatient Hospital Stay: Payer: Medicare Other | Admitting: Oncology

## 2022-10-03 ENCOUNTER — Encounter: Payer: Self-pay | Admitting: Hematology and Oncology

## 2022-10-05 ENCOUNTER — Telehealth: Payer: Self-pay | Admitting: Nurse Practitioner

## 2022-10-05 NOTE — Telephone Encounter (Signed)
Noted, thank you

## 2022-10-05 NOTE — Telephone Encounter (Signed)
S/w pt - is urinating every 30-40 minutes, without drinking excess fluids. Blurry vision, and sugar reading was 370. Pt advised to go to ED for eval ASAP.

## 2022-10-05 NOTE — Telephone Encounter (Signed)
Pt called stating he is urinating a lot, eyes are blurry,  and sugar is 370. Sent to access nurse

## 2022-10-12 ENCOUNTER — Other Ambulatory Visit: Payer: Self-pay | Admitting: Nurse Practitioner

## 2022-10-25 ENCOUNTER — Inpatient Hospital Stay: Payer: Medicare Other

## 2022-10-29 ENCOUNTER — Inpatient Hospital Stay: Payer: Medicare Other

## 2022-10-29 ENCOUNTER — Inpatient Hospital Stay: Payer: Medicare Other | Attending: Oncology

## 2022-10-29 ENCOUNTER — Telehealth: Payer: Self-pay

## 2022-10-29 DIAGNOSIS — A0472 Enterocolitis due to Clostridium difficile, not specified as recurrent: Secondary | ICD-10-CM | POA: Insufficient documentation

## 2022-10-29 DIAGNOSIS — D702 Other drug-induced agranulocytosis: Secondary | ICD-10-CM | POA: Diagnosis not present

## 2022-10-29 DIAGNOSIS — C9201 Acute myeloblastic leukemia, in remission: Secondary | ICD-10-CM

## 2022-10-29 DIAGNOSIS — Z79899 Other long term (current) drug therapy: Secondary | ICD-10-CM

## 2022-10-29 LAB — CBC WITH DIFFERENTIAL/PLATELET
Abs Immature Granulocytes: 0.02 10*3/uL (ref 0.00–0.07)
Basophils Absolute: 0.1 10*3/uL (ref 0.0–0.1)
Basophils Relative: 2 %
Eosinophils Absolute: 0 10*3/uL (ref 0.0–0.5)
Eosinophils Relative: 2 %
HCT: 40.4 % (ref 39.0–52.0)
Hemoglobin: 13.3 g/dL (ref 13.0–17.0)
Immature Granulocytes: 1 %
Lymphocytes Relative: 36 %
Lymphs Abs: 0.9 10*3/uL (ref 0.7–4.0)
MCH: 30.9 pg (ref 26.0–34.0)
MCHC: 32.9 g/dL (ref 30.0–36.0)
MCV: 94 fL (ref 80.0–100.0)
Monocytes Absolute: 0.2 10*3/uL (ref 0.1–1.0)
Monocytes Relative: 8 %
Neutro Abs: 1.3 10*3/uL — ABNORMAL LOW (ref 1.7–7.7)
Neutrophils Relative %: 51 %
Platelets: 167 10*3/uL (ref 150–400)
RBC: 4.3 MIL/uL (ref 4.22–5.81)
RDW: 14 % (ref 11.5–15.5)
WBC: 2.5 10*3/uL — ABNORMAL LOW (ref 4.0–10.5)
nRBC: 0 % (ref 0.0–0.2)

## 2022-10-29 NOTE — Telephone Encounter (Signed)
Patient called regarding transportation, inform him should be on the way apt not until 215

## 2022-10-30 ENCOUNTER — Ambulatory Visit: Payer: No Typology Code available for payment source | Admitting: Urology

## 2022-11-02 ENCOUNTER — Inpatient Hospital Stay: Payer: Medicare Other

## 2022-11-02 ENCOUNTER — Other Ambulatory Visit: Payer: Self-pay | Admitting: *Deleted

## 2022-11-02 ENCOUNTER — Ambulatory Visit: Payer: No Typology Code available for payment source | Admitting: Urology

## 2022-11-02 ENCOUNTER — Inpatient Hospital Stay (HOSPITAL_BASED_OUTPATIENT_CLINIC_OR_DEPARTMENT_OTHER): Payer: Medicare Other | Admitting: Nurse Practitioner

## 2022-11-02 ENCOUNTER — Telehealth: Payer: Self-pay | Admitting: *Deleted

## 2022-11-02 ENCOUNTER — Other Ambulatory Visit: Payer: Self-pay | Admitting: Nurse Practitioner

## 2022-11-02 ENCOUNTER — Other Ambulatory Visit: Payer: Self-pay

## 2022-11-02 ENCOUNTER — Encounter: Payer: Self-pay | Admitting: Nurse Practitioner

## 2022-11-02 ENCOUNTER — Other Ambulatory Visit: Payer: Self-pay | Admitting: Oncology

## 2022-11-02 VITALS — BP 127/79 | HR 80 | Temp 98.9°F | Resp 20 | Ht 69.0 in | Wt 207.0 lb

## 2022-11-02 DIAGNOSIS — R112 Nausea with vomiting, unspecified: Secondary | ICD-10-CM | POA: Diagnosis not present

## 2022-11-02 DIAGNOSIS — R1013 Epigastric pain: Secondary | ICD-10-CM

## 2022-11-02 DIAGNOSIS — R197 Diarrhea, unspecified: Secondary | ICD-10-CM

## 2022-11-02 DIAGNOSIS — C9201 Acute myeloblastic leukemia, in remission: Secondary | ICD-10-CM | POA: Diagnosis not present

## 2022-11-02 LAB — CBC WITH DIFFERENTIAL/PLATELET
Abs Immature Granulocytes: 0 10*3/uL (ref 0.00–0.07)
Basophils Absolute: 0 10*3/uL (ref 0.0–0.1)
Basophils Relative: 1 %
Eosinophils Absolute: 0.1 10*3/uL (ref 0.0–0.5)
Eosinophils Relative: 2 %
HCT: 43 % (ref 39.0–52.0)
Hemoglobin: 14.4 g/dL (ref 13.0–17.0)
Immature Granulocytes: 0 %
Lymphocytes Relative: 26 %
Lymphs Abs: 0.9 10*3/uL (ref 0.7–4.0)
MCH: 31.2 pg (ref 26.0–34.0)
MCHC: 33.5 g/dL (ref 30.0–36.0)
MCV: 93.3 fL (ref 80.0–100.0)
Monocytes Absolute: 0.3 10*3/uL (ref 0.1–1.0)
Monocytes Relative: 9 %
Neutro Abs: 2.2 10*3/uL (ref 1.7–7.7)
Neutrophils Relative %: 62 %
Platelets: 148 10*3/uL — ABNORMAL LOW (ref 150–400)
RBC: 4.61 MIL/uL (ref 4.22–5.81)
RDW: 13.7 % (ref 11.5–15.5)
WBC: 3.5 10*3/uL — ABNORMAL LOW (ref 4.0–10.5)
nRBC: 0 % (ref 0.0–0.2)

## 2022-11-02 LAB — COMPREHENSIVE METABOLIC PANEL
ALT: 46 U/L — ABNORMAL HIGH (ref 0–44)
AST: 27 U/L (ref 15–41)
Albumin: 4.2 g/dL (ref 3.5–5.0)
Alkaline Phosphatase: 69 U/L (ref 38–126)
Anion gap: 6 (ref 5–15)
BUN: 12 mg/dL (ref 6–20)
CO2: 26 mmol/L (ref 22–32)
Calcium: 8.6 mg/dL — ABNORMAL LOW (ref 8.9–10.3)
Chloride: 105 mmol/L (ref 98–111)
Creatinine, Ser: 1.16 mg/dL (ref 0.61–1.24)
GFR, Estimated: >60 mL/min (ref >60)
Glucose, Bld: 62 mg/dL — ABNORMAL LOW (ref 70–99)
Potassium: 3.5 mmol/L (ref 3.5–5.1)
Sodium: 137 mmol/L (ref 135–145)
Total Bilirubin: 1 mg/dL (ref 0.3–1.2)
Total Protein: 7.2 g/dL (ref 6.5–8.1)

## 2022-11-02 LAB — MAGNESIUM: Magnesium: 1.9 mg/dL (ref 1.7–2.4)

## 2022-11-02 MED ORDER — ONDANSETRON HCL 4 MG/2ML IJ SOLN
8.0000 mg | Freq: Once | INTRAMUSCULAR | Status: AC
  Administered 2022-11-02: 8 mg via INTRAVENOUS
  Filled 2022-11-02: qty 4

## 2022-11-02 MED ORDER — SODIUM CHLORIDE 0.9 % IV SOLN
INTRAVENOUS | Status: DC
  Filled 2022-11-02 (×2): qty 250

## 2022-11-02 MED ORDER — SODIUM CHLORIDE 0.9 % IV SOLN: 8.0000 mg | Freq: Once | INTRAVENOUS | Status: DC

## 2022-11-02 MED ORDER — PROMETHAZINE HCL 25 MG PO TABS: 25.0000 mg | ORAL_TABLET | Freq: Three times a day (TID) | ORAL | 0 refills | Status: DC | PRN

## 2022-11-02 NOTE — Telephone Encounter (Signed)
Pt been having nausea and vomiting since wed. Then he  had runs he says for couple of days. He will be seen with [facility] and labs entered and pt told that his labs 12:45 and [place] set up [place]. Jinny Blossom is making appts . Pt was told of the appt today and times

## 2022-11-02 NOTE — Telephone Encounter (Signed)
I have sent a message for the [facility] to see if he can be seen today for nausea and runs per pt

## 2022-11-02 NOTE — Progress Notes (Signed)
Symptom Management [place] at [place]. [facility] [address], [place], [place] [address] [phone] (phone) [phone] (fax)  Patient Care Team: Theresia Lo, NP as PCP - General (Nurse Practitioner) [name], MD as Consulting Physician (Oncology)   Name of the patient: Carl [name]  [id number]  1976/08/19   Date of visit: 11/02/22  Diagnosis- AML  Chief complaint/ Reason for visit- Nausea Vomiting, Diarrhea  Heme/Onc history: Carl [name]. is a 47 y.o. 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 Gene Coding Predicted Protein Variant allele fraction  U2AF1 c.101C>T p.(Ser34Phe) 12.9 %  RUNX1 c.620_621insATCCCCCG p.(Gln208Serfs*6) 6.7 %  NRAS c.38G>A p.(Gly13Asp) 9.2 %   Variants of Unknown Clinical Significance:  Gene Coding Predicted Protein Variant allele fraction  TET2 c.4493G>A p.(Arg1498His) 48.2 %  ETV6 c.776G>A p.(Arg259Gln) 49.1 %   Pertinent Phenotypic data: blasts express CD7, CD13, CD34, CD38, CD71, CD117, and HLA-DR.   He received cytarabine and daunorubicin (7+3) beginning 04/13/2020 and 05/27/2020.  Course has been complicated by fever and neutropenia with mucositis (04/27/2020) and gluteal cleft cellulitis (05/06/2020).Received induction with 7+3+HD with delayed count recovery. D42 BMBx revealed persistent disease and patient re-induced with 7+3.  He then went on to receive consolidation therapy with HiDAC and cycle 4 received on 11/04/2020.  He follows up with Dr. Royce Macadamia at [facility]. Last bone marrow biopsy on 12/05/2020 which showed hypercellular bone marrow with trilineage hematopoiesis and 3% blasts by manual differential.   He is presently on azacitidine p.o. 300 mg daily 2 weeks on 2 weeks off.   Oncology History   No history exists.    Interval history- Patient presents to symptom management clinic for nausea, vomiting, and  diarrhea. 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 Feels poorly. He has chronic nausea and vomiting with vidaza but this is worse. Has been vomiting up his normal medications today. Not able to take oral intake. Has not had antiemetics today or imodium. Is supposed to see urology this afternoon for evaluation as well. No fevers or chills. Mild upper abdominal pain.   Review of systems- Review of Systems  Constitutional:  Positive for malaise/fatigue and weight loss. Negative for chills and fever.  HENT:  Negative for hearing loss, nosebleeds, sore throat and tinnitus.   Eyes:  Negative for blurred vision and double vision.  Respiratory:  Negative for cough, hemoptysis, shortness of breath and wheezing.   Cardiovascular:  Negative for chest pain, palpitations and leg swelling.  Gastrointestinal:  Positive for abdominal pain, diarrhea, nausea and vomiting. Negative for blood in stool, constipation and melena.  Genitourinary:  Negative for dysuria and urgency.  Musculoskeletal:  Negative for back pain, falls, joint pain and myalgias.  Skin:  Negative for itching and rash.  Neurological:  Positive for weakness. Negative for dizziness, tingling, sensory change, loss of consciousness and headaches.  Endo/Heme/Allergies:  Negative for environmental allergies. Does not bruise/bleed easily.  Psychiatric/Behavioral:  Negative for depression. The patient is not nervous/anxious and does not have insomnia.     Allergies  Allergen Reactions   Dapsone     Contraindication - G6PD deficient   Primaquine     Contraindication - G6PD deficient   Rasburicase     Contraindication - G6PD deficient   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[place])  Agranulocytosis secondary to cancer chemotherapy (CODE) ([facility])    Chemotherapy induced neutropenia (HCC)    Pancreatitis    Pancytopenia (HCC)    Thrombocytopenia (HCC)     Tobacco use disorder    Past Surgical History:  Procedure Laterality Date   BONE MARROW BIOPSY     ESOPHAGOGASTRODUODENOSCOPY (EGD) WITH PROPOFOL N/A 07/24/2022   Procedure: ESOPHAGOGASTRODUODENOSCOPY (EGD) WITH PROPOFOL;  Surgeon: [name], MD;  Location: [facility] ENDOSCOPY;  Service: Endoscopy;  Laterality: N/A;   Social History   Socioeconomic History   Marital status: Single    Spouse name: Not on file   Number of children: Not on file   Years of education: Not on file   Highest education level: Not on file  Occupational History   Not on file  Tobacco Use   Smoking status: Former    Packs/day: 1.00    Years: 17.00    Total pack years: 17.00    Types: Cigarettes    Quit date: 03/27/2020    Years since quitting: 2.6   Smokeless tobacco: Never  Vaping Use   Vaping Use: Never used  Substance and Sexual Activity   Alcohol use: Yes    Alcohol/week: 7.0 standard drinks of alcohol    Types: 7 Cans of beer per week    Comment: Socially   Drug use: Not Currently   Sexual activity: Yes  Other Topics Concern   Not on file  Social History Narrative   Not on file   Social Determinants of Health   Financial Resource Strain: Not on file  Food Insecurity: Not on file  Transportation Needs: Unmet Transportation Needs (10/29/2022)   PRAPARE - Hydrologist (Medical): Yes    Lack of Transportation (Non-Medical): Yes  Physical Activity: Not on file  Stress: Not on file  Social Connections: Not on file  Intimate Partner Violence: Not on file   Family History  Problem Relation Age of Onset   Hypertension Mother    Diabetes Mother    Diabetes Father    Prostate cancer Neg Hx    Kidney cancer Neg Hx    Bladder Cancer Neg Hx    Current Outpatient Medications:    azaCITIDine (ONUREG) 300 MG tablet, Take 1 tablet (300 mg total) by mouth daily. Take for 14 days, hold for 14 days. Repeat every 28 days., Disp: 14 tablet, Rfl: 6   glipiZIDE (GLUCOTROL) 5  MG tablet, TAKE 1 TABLET (5 MG TOTAL) BY MOUTH DAILY., Disp: 90 tablet, Rfl: 1   miconazole (MICOTIN) 2 % cream, Apply 1 Application topically 2 (two) times daily., Disp: 28.35 g, Rfl: 0   omeprazole (PRILOSEC) 20 MG capsule, TAKE 1 CAPSULE BY MOUTH EVERY DAY, Disp: 90 capsule, Rfl: 1   valACYclovir (VALTREX) 500 MG tablet, TAKE 1 TABLET (500 MG TOTAL) BY MOUTH DAILY., Disp: 90 tablet, Rfl: 1   ondansetron (ZOFRAN) 4 MG tablet, Take 1 tablet (4 mg total) by mouth every 8 (eight) hours as needed for nausea or vomiting. (Patient not taking: Reported on 11/02/2022), Disp: 60 tablet, Rfl: 0   prochlorperazine (COMPAZINE) 10 MG tablet, Take 1 tablet (10 mg total) by mouth every 6 (six) hours as needed for nausea or vomiting. (Patient not taking: Reported on 11/02/2022), Disp: 20 tablet, Rfl: 0  Physical exam:  Vitals:   11/02/22 1300  BP: 127/79  Pulse: 80  Resp: 20  Temp: 98.9 F (37.2 C)  TempSrc: Tympanic  SpO2: 100%  Weight: 207 lb (  93.9 kg)  Height: '5\' 9"'$  (1.753 m)   Physical Exam Constitutional:      Comments: Fatigued appearing  HENT:     Head: Normocephalic.  Eyes:     General: No scleral icterus. Cardiovascular:     Rate and Rhythm: Normal rate and regular rhythm.  Pulmonary:     Effort: Pulmonary effort is normal.  Abdominal:     General: There is no distension.     Tenderness: There is no abdominal tenderness. There is no guarding.  Skin:    General: Skin is warm and dry.  Neurological:     Mental Status: He is alert and oriented to person, place, and time.  Psychiatric:        Mood and Affect: Mood normal.        Behavior: Behavior normal.        Latest Ref Rng & Units 11/02/2022   12:41 PM  CMP  Glucose 70 - 99 mg/dL 62   BUN 6 - 20 mg/dL 12   Creatinine 0.61 - 1.24 mg/dL 1.16   Sodium 135 - 145 mmol/L 137   Potassium 3.5 - 5.1 mmol/L 3.5   Chloride 98 - 111 mmol/L 105   CO2 22 - 32 mmol/L 26   Calcium 8.9 - 10.3 mg/dL 8.6   Total Protein 6.5 - 8.1 g/dL 7.2    Total Bilirubin 0.3 - 1.2 mg/dL 1.0   Alkaline Phos 38 - 126 U/L 69   AST 15 - 41 U/L 27   ALT 0 - 44 U/L 46       Latest Ref Rng & Units 11/02/2022   12:41 PM  CBC  WBC 4.0 - 10.5 K/uL 3.5   Hemoglobin 13.0 - 17.0 g/dL 14.4   Hematocrit 39.0 - 52.0 % 43.0   Platelets 150 - 400 K/uL 148    No results found.  Assessment and plan- Patient is a 47 y.o. male   Nausea and vomiting- he has chronic nausea and episodic emesis with his vidaza but this is worse and he is not tolerating oral medications including antiemetics x 2 days. Recommend IV fluids and IV antiemetics in clinic. Plan for 1 L NaCl and Zofran 8 mg IV. If able to tolerate food and fluids after, we can discharge home. I've asked him to hold vidaza over the weekend given that this often worsens his nausea. Prescription for phenergan 25 mg PO q8h prn sent to pharmacy. Will re-evaluate Monday to see if symptoms have resolved and can restart vidaza.  Low blood sugar- d/t GI losses and no intake. Hold glipizide. Restart Monday if symptoms improving. Monitor for symptomatic low blood sugars at home.   Disposition:  Monday- see me for [facility] +/- fluids- la   Visit Diagnosis 1. Nausea and vomiting, unspecified vomiting type   2. Diarrhea, unspecified type   3. AML (acute myeloid leukemia) in remission ([place])   4. Epigastric abdominal pain    Patient expressed understanding and was in agreement with this plan. He also understands that He can call clinic at any time with any questions, concerns, or complaints.   Thank you for allowing me to participate in the care of this very pleasant patient.   Beckey Rutter, DNP, AGNP-C, [place] [facility] [place]

## 2022-11-05 ENCOUNTER — Inpatient Hospital Stay: Payer: Medicare Other

## 2022-11-05 ENCOUNTER — Inpatient Hospital Stay (HOSPITAL_BASED_OUTPATIENT_CLINIC_OR_DEPARTMENT_OTHER): Payer: Medicare Other | Admitting: Nurse Practitioner

## 2022-11-05 ENCOUNTER — Other Ambulatory Visit: Payer: Self-pay

## 2022-11-05 ENCOUNTER — Encounter: Payer: Self-pay | Admitting: Nurse Practitioner

## 2022-11-05 VITALS — BP 134/89 | HR 76 | Temp 95.2°F | Resp 20 | Ht 69.0 in | Wt 200.5 lb

## 2022-11-05 DIAGNOSIS — Z79899 Other long term (current) drug therapy: Secondary | ICD-10-CM

## 2022-11-05 DIAGNOSIS — R112 Nausea with vomiting, unspecified: Secondary | ICD-10-CM

## 2022-11-05 DIAGNOSIS — R197 Diarrhea, unspecified: Secondary | ICD-10-CM

## 2022-11-05 DIAGNOSIS — C9201 Acute myeloblastic leukemia, in remission: Secondary | ICD-10-CM | POA: Diagnosis not present

## 2022-11-05 LAB — CBC WITH DIFFERENTIAL (CANCER CENTER ONLY)
Abs Immature Granulocytes: 0.02 10*3/uL (ref 0.00–0.07)
Basophils Absolute: 0 10*3/uL (ref 0.0–0.1)
Basophils Relative: 1 %
Eosinophils Absolute: 0.1 10*3/uL (ref 0.0–0.5)
Eosinophils Relative: 3 %
HCT: 40.5 % (ref 39.0–52.0)
Hemoglobin: 13.7 g/dL (ref 13.0–17.0)
Immature Granulocytes: 1 %
Lymphocytes Relative: 36 %
Lymphs Abs: 1.3 10*3/uL (ref 0.7–4.0)
MCH: 30.6 pg (ref 26.0–34.0)
MCHC: 33.8 g/dL (ref 30.0–36.0)
MCV: 90.4 fL (ref 80.0–100.0)
Monocytes Absolute: 0.2 10*3/uL (ref 0.1–1.0)
Monocytes Relative: 7 %
Neutro Abs: 1.8 10*3/uL (ref 1.7–7.7)
Neutrophils Relative %: 52 %
Platelet Count: 168 10*3/uL (ref 150–400)
RBC: 4.48 MIL/uL (ref 4.22–5.81)
RDW: 13.4 % (ref 11.5–15.5)
WBC Count: 3.5 10*3/uL — ABNORMAL LOW (ref 4.0–10.5)
nRBC: 0 % (ref 0.0–0.2)

## 2022-11-05 LAB — CMP (CANCER CENTER ONLY)
ALT: 45 U/L — ABNORMAL HIGH (ref 0–44)
AST: 29 U/L (ref 15–41)
Albumin: 4.1 g/dL (ref 3.5–5.0)
Alkaline Phosphatase: 70 U/L (ref 38–126)
Anion gap: 8 (ref 5–15)
BUN: 11 mg/dL (ref 6–20)
CO2: 23 mmol/L (ref 22–32)
Calcium: 8.9 mg/dL (ref 8.9–10.3)
Chloride: 105 mmol/L (ref 98–111)
Creatinine: 1.02 mg/dL (ref 0.61–1.24)
GFR, Estimated: >60 mL/min (ref >60)
Glucose, Bld: 94 mg/dL (ref 70–99)
Potassium: 3.2 mmol/L — ABNORMAL LOW (ref 3.5–5.1)
Sodium: 136 mmol/L (ref 135–145)
Total Bilirubin: 0.9 mg/dL (ref 0.3–1.2)
Total Protein: 7.3 g/dL (ref 6.5–8.1)

## 2022-11-05 MED ORDER — ONDANSETRON HCL 4 MG/2ML IJ SOLN
8.0000 mg | Freq: Once | INTRAMUSCULAR | Status: AC
  Administered 2022-11-05: 8 mg via INTRAVENOUS

## 2022-11-05 MED ORDER — SODIUM CHLORIDE 0.9 % IV SOLN
INTRAVENOUS | Status: DC
  Filled 2022-11-05 (×2): qty 250

## 2022-11-05 MED ORDER — SODIUM CHLORIDE 0.9 % IV SOLN: 8.0000 mg | Freq: Once | INTRAVENOUS | Status: DC

## 2022-11-05 NOTE — Progress Notes (Signed)
[facility]pt[facility] [place] [facility] [place]. [facility] [address] [place], [place] [address] [phone] (phone) [phone] (fax)  Patient Care Team: Theresia Lo, NP as PCP - General (Nurse Practitioner) [name], MD as Consulting Physician (Oncology)   Name of the patient: Carl [name]  [id number]  May 31, 1976   Date of visit: 11/05/22  Diagnosis- AML  Chief complaint/ Reason for visit- Nausea Vomiting, Diarrhea  Heme/Onc history: Carl [name]. is a 47 y.o. 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 Gene Coding Predicted Protein Variant allele fraction  U2AF1 c.101C>T p.(Ser34Phe) 12.9 %  RUNX1 c.620_621insATCCCCCG p.(Gln208Serfs*6) 6.7 %  NRAS c.38G>A p.(Gly13Asp) 9.2 %   Variants of Unknown Clinical Significance:  Gene Coding Predicted Protein Variant allele fraction  TET2 c.4493G>A p.(Arg1498His) 48.2 %  ETV6 c.776G>A p.(Arg259Gln) 49.1 %   Pertinent Phenotypic data: blasts express CD7, CD13, CD34, CD38, CD71, CD117, and HLA-DR.   He received cytarabine and daunorubicin (7+3) beginning 04/13/2020 and 05/27/2020.  Course has been complicated by fever and neutropenia with mucositis (04/27/2020) and gluteal cleft cellulitis (05/06/2020).Received induction with 7+3+HD with delayed count recovery. D42 BMBx revealed persistent disease and patient re-induced with 7+3.  He then went on to receive consolidation therapy with HiDAC and cycle 4 received on 11/04/2020.  He follows up with Dr. Royce Macadamia at [facility]. Last bone marrow biopsy on 12/05/2020 which showed hypercellular bone marrow with trilineage hematopoiesis and 3% blasts by manual differential.   He is presently on azacitidine p.o. 300 mg daily 2 weeks on 2 weeks off.   Oncology History   No history exists.    Interval history- Patient returns to clinic for follow up of nausea, vomiting, and diarrhea.  He was seen on Friday for same symptoms. Did not hold vidaza and continued to take it through the weekend. He has not started phenergan either. Continues to be nauseous. Has eaten minimal amounts of food. Continues tohave diarrhea.     Review of systems- Review of Systems  Constitutional:  Positive for malaise/fatigue and weight loss. Negative for chills and fever.  HENT:  Negative for hearing loss, nosebleeds, sore throat and tinnitus.   Eyes:  Negative for blurred vision and double vision.  Respiratory:  Negative for cough, hemoptysis, shortness of breath and wheezing.   Cardiovascular:  Negative for chest pain, palpitations and leg swelling.  Gastrointestinal:  Positive for abdominal pain, diarrhea, nausea and vomiting. Negative for blood in stool, constipation and melena.  Genitourinary:  Negative for dysuria and urgency.  Musculoskeletal:  Negative for back pain, falls, joint pain and myalgias.  Skin:  Negative for itching and rash.  Neurological:  Positive for weakness. Negative for dizziness, tingling, sensory change, loss of consciousness and headaches.  Endo/Heme/Allergies:  Negative for environmental allergies. Does not bruise/bleed easily.  Psychiatric/Behavioral:  Negative for depression. The patient is not nervous/anxious and does not have insomnia.     Allergies  Allergen Reactions   Dapsone     Contraindication - G6PD deficient   Primaquine     Contraindication - G6PD deficient   Rasburicase     Contraindication - G6PD deficient   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[place])    Agranulocytosis secondary to cancer chemotherapy (CODE) ([place])    Chemotherapy induced neutropenia (HCC)    Pancreatitis    Pancytopenia (HCC)    Thrombocytopenia (HCC)    Tobacco  use disorder    Past Surgical History:  Procedure Laterality Date   BONE MARROW BIOPSY     ESOPHAGOGASTRODUODENOSCOPY (EGD) WITH PROPOFOL N/A 07/24/2022   Procedure:  ESOPHAGOGASTRODUODENOSCOPY (EGD) WITH PROPOFOL;  Surgeon: [name], MD;  Location: [facility] ENDOSCOPY;  Service: Endoscopy;  Laterality: N/A;   Social History   Socioeconomic History   Marital status: Single    Spouse name: Not on file   Number of children: Not on file   Years of education: Not on file   Highest education level: Not on file  Occupational History   Not on file  Tobacco Use   Smoking status: Former    Packs/day: 1.00    Years: 17.00    Total pack years: 17.00    Types: Cigarettes    Quit date: 03/27/2020    Years since quitting: 2.6   Smokeless tobacco: Never  Vaping Use   Vaping Use: Never used  Substance and Sexual Activity   Alcohol use: Yes    Alcohol/week: 7.0 standard drinks of alcohol    Types: 7 Cans of beer per week    Comment: Socially   Drug use: Not Currently   Sexual activity: Yes  Other Topics Concern   Not on file  Social History Narrative   Not on file   Social Determinants of Health   Financial Resource Strain: Not on file  Food Insecurity: Not on file  Transportation Needs: Unmet Transportation Needs (11/05/2022)   PRAPARE - Hydrologist (Medical): Yes    Lack of Transportation (Non-Medical): Yes  Physical Activity: Not on file  Stress: Not on file  Social Connections: Not on file  Intimate Partner Violence: Not on file   Family History  Problem Relation Age of Onset   Hypertension Mother    Diabetes Mother    Diabetes Father    Prostate cancer Neg Hx    Kidney cancer Neg Hx    Bladder Cancer Neg Hx    Current Outpatient Medications:    azaCITIDine (ONUREG) 300 MG tablet, Take 1 tablet (300 mg total) by mouth daily. Take for 14 days, hold for 14 days. Repeat every 28 days., Disp: 14 tablet, Rfl: 6   miconazole (MICOTIN) 2 % cream, Apply 1 Application topically 2 (two) times daily., Disp: 28.35 g, Rfl: 0   omeprazole (PRILOSEC) 20 MG capsule, TAKE 1 CAPSULE BY MOUTH EVERY DAY, Disp: 90 capsule, Rfl:  2   ondansetron (ZOFRAN) 4 MG tablet, Take 1 tablet (4 mg total) by mouth every 8 (eight) hours as needed for nausea or vomiting., Disp: 60 tablet, Rfl: 0   valACYclovir (VALTREX) 500 MG tablet, TAKE 1 TABLET (500 MG TOTAL) BY MOUTH DAILY., Disp: 90 tablet, Rfl: 1   glipiZIDE (GLUCOTROL) 5 MG tablet, TAKE 1 TABLET (5 MG TOTAL) BY MOUTH DAILY., Disp: 90 tablet, Rfl: 1   prochlorperazine (COMPAZINE) 10 MG tablet, Take 1 tablet (10 mg total) by mouth every 6 (six) hours as needed for nausea or vomiting. (Patient not taking: Reported on 11/02/2022), Disp: 20 tablet, Rfl: 0   promethazine (PHENERGAN) 25 MG tablet, Take 1 tablet (25 mg total) by mouth every 8 (eight) hours as needed for refractory nausea / vomiting. Medication may cause sleepiness. Do not drive with this medication. (Patient not taking: Reported on 11/05/2022), Disp: 30 tablet, Rfl: 0  Physical exam:  Vitals:   11/05/22 0845 11/05/22 0853  BP:  134/89  Pulse:  76  Resp: 20   Temp:  Marland Kitchen)  95.2 F (35.1 C)  TempSrc:  Tympanic  Weight:  200 lb 8 oz (90.9 kg)  Height:  '5\' 9"'$  (1.753 m)   Physical Exam Constitutional:      Comments: Fatigued appearing  HENT:     Head: Normocephalic.  Eyes:     General: No scleral icterus. Cardiovascular:     Rate and Rhythm: Normal rate and regular rhythm.  Pulmonary:     Effort: Pulmonary effort is normal.  Abdominal:     General: There is no distension.     Tenderness: There is no abdominal tenderness. There is no guarding.  Skin:    General: Skin is warm and dry.  Neurological:     Mental Status: He is alert and oriented to person, place, and time.  Psychiatric:        Mood and Affect: Mood normal.        Behavior: Behavior normal.        Latest Ref Rng & Units 11/02/2022   12:41 PM  CMP  Glucose 70 - 99 mg/dL 62   BUN 6 - 20 mg/dL 12   Creatinine 0.61 - 1.24 mg/dL 1.16   Sodium 135 - 145 mmol/L 137   Potassium 3.5 - 5.1 mmol/L 3.5   Chloride 98 - 111 mmol/L 105   CO2 22 - 32  mmol/L 26   Calcium 8.9 - 10.3 mg/dL 8.6   Total Protein 6.5 - 8.1 g/dL 7.2   Total Bilirubin 0.3 - 1.2 mg/dL 1.0   Alkaline Phos 38 - 126 U/L 69   AST 15 - 41 U/L 27   ALT 0 - 44 U/L 46       Latest Ref Rng & Units 11/02/2022   12:41 PM  CBC  WBC 4.0 - 10.5 K/uL 3.5   Hemoglobin 13.0 - 17.0 g/dL 14.4   Hematocrit 39.0 - 52.0 % 43.0   Platelets 150 - 400 K/uL 148    No results found.  Assessment and plan- Patient is a 47 y.o. male   Nausea and vomiting- he has chronic nausea and episodic emesis with his vidaza but this is worse. Etiology remains unclear. He has not held vidaza over weekend as recommended. Ongoing nausea, vomiting, and weight loss. Will plan for 1L NaCl and Zofran 8 mg IV today. Plan for phenergan as previously prescribed.  Diarrhea- etiology unclear. Question infectious etiology particularly in view of his new high risk work setting. Check stool studies today. Hold imodium. If negative, plan for imodium to control diarrhea per package directions.  Low blood sugar- d/t GI losses and no intake. Hold glipizide. Restart Monday if symptoms improving. Monitor for symptomatic low blood sugars at home.   Disposition:  Add cbc, cmp, & stool studies Fluids today and zofran.  Rtc on Wednesday to see me, +/- fluids- la    Visit Diagnosis 1. Nausea and vomiting, unspecified vomiting type   2. Diarrhea, unspecified type   3. High risk medication use    Patient expressed understanding and was in agreement with this plan. He also understands that He can call clinic at any time with any questions, concerns, or complaints.   Thank you for allowing me to participate in the care of this very pleasant patient.   Beckey Rutter, DNP, AGNP-C, [place] [facility] [place]

## 2022-11-05 NOTE — Progress Notes (Signed)
1 L NS given and '8mg'$  Zofran. Pt tolerated one pack of peanut butter crackers and a Ginger Ale while receiving fluids.

## 2022-11-07 ENCOUNTER — Other Ambulatory Visit: Payer: Self-pay | Admitting: Oncology

## 2022-11-07 ENCOUNTER — Inpatient Hospital Stay (HOSPITAL_BASED_OUTPATIENT_CLINIC_OR_DEPARTMENT_OTHER): Payer: Medicare Other | Admitting: Nurse Practitioner

## 2022-11-07 ENCOUNTER — Other Ambulatory Visit: Payer: Self-pay

## 2022-11-07 ENCOUNTER — Telehealth: Payer: Self-pay | Admitting: *Deleted

## 2022-11-07 ENCOUNTER — Encounter: Payer: Self-pay | Admitting: Nurse Practitioner

## 2022-11-07 ENCOUNTER — Inpatient Hospital Stay: Payer: Medicare Other

## 2022-11-07 VITALS — BP 136/73 | HR 72 | Temp 97.6°F | Resp 20 | Ht 69.0 in | Wt 202.3 lb

## 2022-11-07 DIAGNOSIS — A080 Rotaviral enteritis: Secondary | ICD-10-CM | POA: Diagnosis not present

## 2022-11-07 DIAGNOSIS — A0472 Enterocolitis due to Clostridium difficile, not specified as recurrent: Secondary | ICD-10-CM | POA: Diagnosis not present

## 2022-11-07 DIAGNOSIS — R197 Diarrhea, unspecified: Secondary | ICD-10-CM

## 2022-11-07 DIAGNOSIS — Z79899 Other long term (current) drug therapy: Secondary | ICD-10-CM

## 2022-11-07 DIAGNOSIS — C9201 Acute myeloblastic leukemia, in remission: Secondary | ICD-10-CM

## 2022-11-07 DIAGNOSIS — R112 Nausea with vomiting, unspecified: Secondary | ICD-10-CM | POA: Diagnosis not present

## 2022-11-07 LAB — GASTROINTESTINAL PANEL BY PCR, STOOL (REPLACES STOOL CULTURE)

## 2022-11-07 LAB — C DIFFICILE QUICK SCREEN W PCR REFLEX
C Diff antigen: POSITIVE — AB
C Diff toxin: NEGATIVE

## 2022-11-07 LAB — CLOSTRIDIUM DIFFICILE BY PCR, REFLEXED: Toxigenic C. Difficile by PCR: POSITIVE — AB

## 2022-11-07 MED ORDER — VANCOMYCIN HCL 125 MG PO CAPS
125.0000 mg | ORAL_CAPSULE | Freq: Four times a day (QID) | ORAL | 0 refills | Status: AC
  Filled 2022-11-07: qty 40, 10d supply, fill #0

## 2022-11-07 MED ORDER — FIDAXOMICIN 200 MG PO TABS: 200.0000 mg | ORAL_TABLET | Freq: Two times a day (BID) | ORAL | 0 refills | Status: DC

## 2022-11-07 MED ORDER — LOPERAMIDE HCL 2 MG PO CAPS: 2.0000 mg | ORAL_CAPSULE | ORAL | 2 refills | Status: DC | PRN

## 2022-11-07 NOTE — Progress Notes (Addendum)
Hematology/Oncology Consult note [facility]  [phone] [phone]  Patient Care Team: Theresia Lo, NP as PCP - General (Nurse Practitioner) [name], MD as Consulting Physician (Oncology)   Name of the patient: Carl [name]  [id number]  1976-04-22   Date of visit: 11/07/22  Diagnosis- history of AML in remission    Chief complaint/ Reason for visit-acute visit for ongoing nausea  Heme/Onc history: Carl [name]. is a 47 y.o. 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 Gene Coding Predicted Protein Variant allele fraction  U2AF1 c.101C>T p.(Ser34Phe) 12.9 %  RUNX1 c.620_621insATCCCCCG p.(Gln208Serfs*6) 6.7 %  NRAS c.38G>A p.(Gly13Asp) 9.2 %   Variants of Unknown Clinical Significance:  Gene Coding Predicted Protein Variant allele fraction  TET2 c.4493G>A p.(Arg1498His) 48.2 %  ETV6 c.776G>A p.(Arg259Gln) 49.1 %   Pertinent Phenotypic data: blasts express CD7, CD13, CD34, CD38, CD71, CD117, and HLA-DR.   He received cytarabine and daunorubicin (7+3) beginning 04/13/2020 and 05/27/2020.  Course has been complicated by fever and neutropenia with mucositis (04/27/2020) and gluteal cleft cellulitis (05/06/2020).Received induction with 7+3+HD with delayed count recovery. D42 BMBx revealed persistent disease and patient re-induced with 7+3.  He then went on to receive consolidation therapy with HiDAC and cycle 4 received on 11/04/2020.  He follows up with Dr. Royce Macadamia at [facility]. Last bone marrow biopsy on 12/05/2020 which showed hypercellular bone marrow with trilineage hematopoiesis and 3% blasts by manual differential.   He is presently on azacitidine p.o. 300 mg daily 2 weeks on 2 weeks off.      Interval history- Has completed cycle of oral vidaza. Nausea, vomiting, and diarrhea has now resolved. He brought stool sample today to check for infection. Was able to eat yesterday and keep food down. Starting to  feel more like himself. He is possibly going to [place] next month with family for vacation. Next cycle due to start 3/25 and he will see Dr Janese Banks on that day.   ECOG PS- 0 Pain scale- 0  Review of systems- Review of Systems  Constitutional:  Positive for malaise/fatigue. Negative for chills, fever and weight loss.  HENT:  Negative for congestion, ear discharge and nosebleeds.   Eyes:  Negative for blurred vision.  Respiratory:  Negative for cough, hemoptysis, sputum production, shortness of breath and wheezing.   Cardiovascular:  Negative for chest pain, palpitations, orthopnea and claudication.  Gastrointestinal:  Negative for abdominal pain, blood in stool, constipation, diarrhea, heartburn, melena, nausea and vomiting.  Genitourinary:  Negative for dysuria, flank pain, frequency, hematuria and urgency.  Musculoskeletal:  Negative for back pain, joint pain and myalgias.  Skin:  Negative for rash.  Neurological:  Negative for dizziness, tingling, focal weakness, seizures, weakness and headaches.  Endo/Heme/Allergies:  Does not bruise/bleed easily.  Psychiatric/Behavioral:  Negative for depression and suicidal ideas. The patient does not have insomnia.     Allergies  Allergen Reactions   Dapsone     Contraindication - G6PD deficient   Primaquine     Contraindication - G6PD deficient   Rasburicase     Contraindication - G6PD deficient   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[place])    Agranulocytosis secondary to cancer chemotherapy (CODE) ([place])    Chemotherapy induced neutropenia (H[facility])    Pancreatitis    Pancytopenia (HCC)    Thrombocytopenia (HCC)    Tobacco use disorder    Past Surgical History:  Procedure Laterality Date   BONE  MARROW BIOPSY     ESOPHAGOGASTRODUODENOSCOPY (EGD) WITH PROPOFOL N/A 07/24/2022   Procedure: ESOPHAGOGASTRODUODENOSCOPY (EGD) WITH PROPOFOL;  Surgeon: [name], MD;  Location: [facility] ENDOSCOPY;  Service:  Endoscopy;  Laterality: N/A;   Social History   Socioeconomic History   Marital status: Single    Spouse name: Not on file   Number of children: Not on file   Years of education: Not on file   Highest education level: Not on file  Occupational History   Not on file  Tobacco Use   Smoking status: Former    Packs/day: 1.00    Years: 17.00    Total pack years: 17.00    Types: Cigarettes    Quit date: 03/27/2020    Years since quitting: 2.6   Smokeless tobacco: Never  Vaping Use   Vaping Use: Never used  Substance and Sexual Activity   Alcohol use: Yes    Alcohol/week: 7.0 standard drinks of alcohol    Types: 7 Cans of beer per week    Comment: Socially   Drug use: Not Currently   Sexual activity: Yes  Other Topics Concern   Not on file  Social History Narrative   Not on file   Social Determinants of Health   Financial Resource Strain: Not on file  Food Insecurity: Not on file  Transportation Needs: Unmet Transportation Needs (11/05/2022)   PRAPARE - Hydrologist (Medical): Yes    Lack of Transportation (Non-Medical): Yes  Physical Activity: Not on file  Stress: Not on file  Social Connections: Not on file  Intimate Partner Violence: Not on file   Family History  Problem Relation Age of Onset   Hypertension Mother    Diabetes Mother    Diabetes Father    Prostate cancer Neg Hx    Kidney cancer Neg Hx    Bladder Cancer Neg Hx    Current Outpatient Medications:    glipiZIDE (GLUCOTROL) 5 MG tablet, TAKE 1 TABLET (5 MG TOTAL) BY MOUTH DAILY., Disp: 90 tablet, Rfl: 1   miconazole (MICOTIN) 2 % cream, Apply 1 Application topically 2 (two) times daily., Disp: 28.35 g, Rfl: 0   omeprazole (PRILOSEC) 20 MG capsule, TAKE 1 CAPSULE BY MOUTH EVERY DAY, Disp: 90 capsule, Rfl: 2   ondansetron (ZOFRAN) 4 MG tablet, TAKE 1 TABLET BY MOUTH EVERY 8 HOURS AS NEEDED FOR NAUSEA AND VOMITING, Disp: 60 tablet, Rfl: 0   valACYclovir (VALTREX) 500 MG tablet,  TAKE 1 TABLET (500 MG TOTAL) BY MOUTH DAILY., Disp: 90 tablet, Rfl: 1   azaCITIDine (ONUREG) 300 MG tablet, Take 1 tablet (300 mg total) by mouth daily. Take for 14 days, hold for 14 days. Repeat every 28 days. (Patient not taking: Reported on 11/07/2022), Disp: 14 tablet, Rfl: 6   prochlorperazine (COMPAZINE) 10 MG tablet, Take 1 tablet (10 mg total) by mouth every 6 (six) hours as needed for nausea or vomiting. (Patient not taking: Reported on 11/02/2022), Disp: 20 tablet, Rfl: 0   promethazine (PHENERGAN) 25 MG tablet, Take 1 tablet (25 mg total) by mouth every 8 (eight) hours as needed for refractory nausea / vomiting. Medication may cause sleepiness. Do not drive with this medication. (Patient not taking: Reported on 11/05/2022), Disp: 30 tablet, Rfl: 0  Physical exam:  Vitals:   11/07/22 0936  BP: 136/73  Pulse: 72  Resp: 20  Temp: 97.6 F (36.4 C)  TempSrc: Tympanic  SpO2: 100%  Weight: 202 lb 4.8 oz (91.8 kg)  Height: '5\' 9"'$  (1.753 m)   Physical Exam Vitals reviewed.  Constitutional:      General: He is not in acute distress.    Appearance: He is not ill-appearing.  Eyes:     General: No scleral icterus. Cardiovascular:     Rate and Rhythm: Normal rate and regular rhythm.  Pulmonary:     Effort: Pulmonary effort is normal.     Breath sounds: Normal breath sounds.  Abdominal:     General: There is no distension.     Palpations: Abdomen is soft.     Tenderness: There is no abdominal tenderness.  Skin:    General: Skin is warm and dry.  Neurological:     Mental Status: He is alert and oriented to person, place, and time.  Psychiatric:        Mood and Affect: Mood normal.        Behavior: Behavior normal.        Latest Ref Rng & Units 11/05/2022    9:37 AM  CMP  Glucose 70 - 99 mg/dL 94   BUN 6 - 20 mg/dL 11   Creatinine 0.61 - 1.24 mg/dL 1.02   Sodium 135 - 145 mmol/L 136   Potassium 3.5 - 5.1 mmol/L 3.2   Chloride 98 - 111 mmol/L 105   CO2 22 - 32 mmol/L 23    Calcium 8.9 - 10.3 mg/dL 8.9   Total Protein 6.5 - 8.1 g/dL 7.3   Total Bilirubin 0.3 - 1.2 mg/dL 0.9   Alkaline Phos 38 - 126 U/L 70   AST 15 - 41 U/L 29   ALT 0 - 44 U/L 45       Latest Ref Rng & Units 11/05/2022    9:37 AM  CBC  WBC 4.0 - 10.5 K/uL 3.5   Hemoglobin 13.0 - 17.0 g/dL 13.7   Hematocrit 39.0 - 52.0 % 40.5   Platelets 150 - 400 K/uL 168    No images are attached to the encounter.  No results found.   Assessment and plan- Patient is a 47 y.o. male with   History of AML - on oral Vidaza. He held D14 of his cycle of vidaza d/t symptoms. He will see Dr Janese Banks as scheduled for evaluation prior to next cycle of vidaza. Will likely need ongoing premedication with antiemetics given chronic nausea & vomiting with vidaza. Zofran refilled.  Diarrhea- stool studies pending at time of visit but have since resulted and are positive for c diff infection and rotavirus. 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 Will start dificid 200 mg BID x 10 days. He may require extended treatment if delayed response/ongoing symptoms. Advised to avoid antidiarrheals with cdiff. Unfortunately, he does not have prescription drug coverage and is not able to get dificid. Start vancomycin 125 mg q6h x 10 days. Unfortunately, this might not still be affordable for patient. I've asked nursing to investigate if [facility] at outpatient pharmacy might be able to assist. I called patient to discuss and left vm.  Nausea & Vomiting- he has chronic symptoms related to vidaza but acutely worse were likely related to c diff and rotavirus. Now improved and he is tolerating oral intake. Declines IV fluids today. Zofran refilled.    Visit Diagnosis 1. C. difficile diarrhea   2. Rotaviral gastroenteritis   3. Nausea and vomiting, unspecified vomiting type   4. Diarrhea, unspecified type   5. High risk medication use   6. AML (acute myeloid  leukemia) in remission ([place])    Beckey Rutter, DNP, AGNP-C,  [place] at [facility] [phone] (clinic)  CC: Dr Janese Banks

## 2022-11-07 NOTE — Progress Notes (Signed)
Called to speak with patient regarding the Gerlene Fee, reached his voicemail, left him a message asking him to return my call.

## 2022-11-07 NOTE — Telephone Encounter (Signed)
Patient is + for c-diff and requires oral vanc.   Per NP- cost of oral vanc at [facility] b/w $200-300 for the 10 days supply of capsules. Per [facility]- cost would be much lower for patient. Script being transferred to [facility]. I have attempted to reach pt mulitple times this afternoon and no answer. Left vm. I attempted to reach family members on contacts/dpr. No answer. Unable to leave any msgs for family.Marland Kitchen

## 2022-11-07 NOTE — Addendum Note (Signed)
Addended by: Verlon Au on: 11/07/2022 03:37 PM   Modules accepted: Orders, Level of Service

## 2022-11-08 ENCOUNTER — Encounter: Payer: Self-pay | Admitting: Nurse Practitioner

## 2022-11-08 ENCOUNTER — Ambulatory Visit: Payer: Medicare Other | Admitting: Nurse Practitioner

## 2022-11-08 VITALS — BP 120/74 | HR 65 | Temp 98.2°F | Ht 69.0 in | Wt 199.2 lb

## 2022-11-08 DIAGNOSIS — R739 Hyperglycemia, unspecified: Secondary | ICD-10-CM

## 2022-11-08 DIAGNOSIS — C9201 Acute myeloblastic leukemia, in remission: Secondary | ICD-10-CM

## 2022-11-08 DIAGNOSIS — M545 Low back pain, unspecified: Secondary | ICD-10-CM

## 2022-11-08 LAB — HEMOGLOBIN A1C: Hgb A1c MFr Bld: 4.6 % (ref 4.6–6.5)

## 2022-11-08 NOTE — Patient Instructions (Signed)
Routine care. Will check Hb A1c. Follow up in 4 months

## 2022-11-08 NOTE — Progress Notes (Signed)
Spoke with Mr. Bogdanski about the Gerlene Fee to help with cost of medication, he will be in to sign and bring proof of income.

## 2022-11-08 NOTE — Telephone Encounter (Signed)
Spoke with patient at 845 am today. Patient educated on cdiff precautions. Pt instructed to wash hands/bleach wipe areas after bathroom use. He stated that he currently lives with his girlfriend, who has immune issues. He stated that he will make sure things are wiped down after bathroom use to prevent spread of the germs. Pt instructed to pick up prescription at [place] at [facility] building. Pt instructed on the location of this pharmacy. I discussed that his out of pocket expense at [facility] for the oral Lucianne Lei would be between $200-300 for a 10 days supply and the price quoted at [facility] for his out of pocket expense there was about $45. He stated that he would be able to cover this apt. He understands that the patient financial advocate would be reaching out to him to discuss financial assistance for medications (should he need assistance in the near future). He gave verbal understanding of the plan and stated that he would pick up med later this afternoon when he meets with a NEW pcp at [facility] on [address].

## 2022-11-08 NOTE — Progress Notes (Signed)
Established Patient Office Visit  Subjective    Patient ID: Carl [name]., male    DOB: [birth year]  Age: 47 y.o. MRN: [id number]  CC:  Chief Complaint  Patient presents with   Establish Care    Transfer of Care    HPI Carl [name]. 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 He has hyperglycemia with last HgA1c 5.2  on 05/18/22 and he check BS at home but he stated he do not have blood sugar numbers with him.  He also complains of low back pain off and on.   Outpatient Encounter Medications as of 11/08/2022  Medication Sig   azaCITIDine (ONUREG) 300 MG tablet Take 1 tablet (300 mg total) by mouth daily. Take for 14 days, hold for 14 days. Repeat every 28 days.   glipiZIDE (GLUCOTROL) 5 MG tablet TAKE 1 TABLET (5 MG TOTAL) BY MOUTH DAILY.   miconazole (MICOTIN) 2 % cream Apply 1 Application topically 2 (two) times daily.   omeprazole (PRILOSEC) 20 MG capsule TAKE 1 CAPSULE BY MOUTH EVERY DAY   ondansetron (ZOFRAN) 4 MG tablet TAKE 1 TABLET BY MOUTH EVERY 8 HOURS AS NEEDED FOR NAUSEA AND VOMITING   prochlorperazine (COMPAZINE) 10 MG tablet Take 1 tablet (10 mg total) by mouth every 6 (six) hours as needed for nausea or vomiting.   promethazine (PHENERGAN) 25 MG tablet Take 1 tablet (25 mg total) by mouth every 8 (eight) hours as needed for refractory nausea / vomiting. Medication may cause sleepiness. Do not drive with this medication.   valACYclovir (VALTREX) 500 MG tablet TAKE 1 TABLET (500 MG TOTAL) BY MOUTH DAILY.   [EXPIRED] vancomycin (VANCOCIN) 125 MG capsule Take 1 capsule (125 mg total) by mouth every 6 (six) hours for 10 days. For c difficile infection   No facility-administered encounter medications on file as of 11/08/2022.    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[place])    Agranulocytosis secondary to cancer chemotherapy (CODE) ([place])    Chemotherapy induced  neutropenia ([facility])    Pancreatitis    Pancytopenia (HCC)    Thrombocytopenia (H[facility])    Tobacco use disorder     Past Surgical History:  Procedure Laterality Date   BONE MARROW BIOPSY     ESOPHAGOGASTRODUODENOSCOPY (EGD) WITH PROPOFOL N/A 07/24/2022   Procedure: ESOPHAGOGASTRODUODENOSCOPY (EGD) WITH PROPOFOL;  Surgeon: [name], MD;  Location: [facility] ENDOSCOPY;  Service: Endoscopy;  Laterality: N/A;    Family History  Problem Relation Age of Onset   Hypertension Mother    Diabetes Mother    Diabetes Father    Prostate cancer Neg Hx    Kidney cancer Neg Hx    Bladder Cancer Neg Hx     Social History   Socioeconomic History   Marital status: Single    Spouse name: Not on file   Number of children: Not on file   Years of education: Not on file   Highest education level: Not on file  Occupational History   Not on file  Tobacco Use   Smoking status: Former    Packs/day: 1.00    Years: 17.00    Additional pack years: 0.00    Total pack years: 17.00    Types: Cigarettes    Quit date:  03/27/2020    Years since quitting: 2.6   Smokeless tobacco: Never  Vaping Use   Vaping Use: Never used  Substance and Sexual Activity   Alcohol use: Yes    Alcohol/week: 7.0 standard drinks of alcohol    Types: 7 Cans of beer per week    Comment: Socially   Drug use: Not Currently   Sexual activity: Yes  Other Topics Concern   Not on file  Social History Narrative   Not on file   Social Determinants of Health   Financial Resource Strain: Not on file  Food Insecurity: Not on file  Transportation Needs: Unmet Transportation Needs (11/19/2022)   PRAPARE - Hydrologist (Medical): Yes    Lack of Transportation (Non-Medical): Yes  Physical Activity: Not on file  Stress: Not on file  Social Connections: Not on file  Intimate Partner Violence: Not on file    Review of Systems  Constitutional: Negative.   HENT: Negative.    Eyes: Negative.    Respiratory: Negative.    Cardiovascular: Negative.   Gastrointestinal: Negative.   Genitourinary: Negative.   Musculoskeletal:  Positive for back pain.  Skin: Negative.   Neurological: Negative.   Psychiatric/Behavioral: Negative.          Objective    BP 120/74   Pulse 65   Temp 98.2 F (36.8 C)   Ht 5\' 9"  (1.753 m)   Wt 199 lb 3.2 oz (90.4 kg)   SpO2 98%   BMI 29.42 kg/m   Physical Exam Constitutional:      Appearance: Normal appearance. He is obese.  HENT:     Head: Normocephalic.     Right Ear: Tympanic membrane normal.     Left Ear: Tympanic membrane normal.     Nose: Nose normal.     Mouth/Throat:     Mouth: Mucous membranes are moist.  Eyes:     Conjunctiva/sclera: Conjunctivae normal.     Pupils: Pupils are equal, round, and reactive to light.  Cardiovascular:     Rate and Rhythm: Normal rate and regular rhythm.     Pulses: Normal pulses.  Pulmonary:     Effort: Pulmonary effort is normal.     Breath sounds: Normal breath sounds. No stridor. No wheezing.  Abdominal:     General: Bowel sounds are normal.     Palpations: Abdomen is soft.  Musculoskeletal:        General: Normal range of motion.  Skin:    General: Skin is warm.  Neurological:     General: No focal deficit present.     Mental Status: He is alert and oriented to person, place, and time. Mental status is at baseline.  Psychiatric:        Mood and Affect: Mood normal.        Behavior: Behavior normal.        Thought Content: Thought content normal.        Judgment: Judgment normal.         Assessment & Plan:   Problem List Items Addressed This Visit       Other   Notalgia    Stable at present. Advised to continue use of Voltaren gel and heat/ice.      Acute myeloid leukemia in remission    He is followed by oncologist Dr. Janese Banks.      Hyperglycemia - Primary    Will check hemoglobin A1c. Continue glipizide 5 Mg daily.  Relevant Orders   HgB A1c (Completed)    No follow-ups on file.   Theresia Lo, NP   HPI Review of Systems  Constitutional: Negative.   HENT: Negative.    Eyes: Negative.   Respiratory: Negative.    Cardiovascular: Negative.   Gastrointestinal: Negative.   Genitourinary: Negative.   Musculoskeletal:  Positive for back pain.  Skin: Negative.   Neurological: Negative.   Psychiatric/Behavioral: Negative.

## 2022-11-09 ENCOUNTER — Other Ambulatory Visit: Payer: Self-pay

## 2022-11-12 ENCOUNTER — Encounter: Payer: Self-pay | Admitting: Hematology and Oncology

## 2022-11-15 ENCOUNTER — Other Ambulatory Visit: Payer: Self-pay

## 2022-11-19 ENCOUNTER — Inpatient Hospital Stay: Payer: Medicare Other

## 2022-11-19 ENCOUNTER — Encounter: Payer: Self-pay | Admitting: Oncology

## 2022-11-19 ENCOUNTER — Inpatient Hospital Stay (HOSPITAL_BASED_OUTPATIENT_CLINIC_OR_DEPARTMENT_OTHER): Payer: Medicare Other | Admitting: Oncology

## 2022-11-19 VITALS — BP 120/65 | HR 77 | Temp 98.4°F | Resp 18 | Ht 69.0 in | Wt 207.1 lb

## 2022-11-19 DIAGNOSIS — D702 Other drug-induced agranulocytosis: Secondary | ICD-10-CM | POA: Diagnosis not present

## 2022-11-19 DIAGNOSIS — Z79899 Other long term (current) drug therapy: Secondary | ICD-10-CM

## 2022-11-19 DIAGNOSIS — C9201 Acute myeloblastic leukemia, in remission: Secondary | ICD-10-CM | POA: Diagnosis not present

## 2022-11-19 LAB — COMPREHENSIVE METABOLIC PANEL
ALT: 56 U/L — ABNORMAL HIGH (ref 0–44)
AST: 30 U/L (ref 15–41)
Albumin: 4.2 g/dL (ref 3.5–5.0)
Alkaline Phosphatase: 65 U/L (ref 38–126)
Anion gap: 6 (ref 5–15)
BUN: 13 mg/dL (ref 6–20)
CO2: 26 mmol/L (ref 22–32)
Calcium: 8.9 mg/dL (ref 8.9–10.3)
Chloride: 103 mmol/L (ref 98–111)
Creatinine, Ser: 1 mg/dL (ref 0.61–1.24)
GFR, Estimated: >60 mL/min (ref >60)
Glucose, Bld: 69 mg/dL — ABNORMAL LOW (ref 70–99)
Potassium: 3.7 mmol/L (ref 3.5–5.1)
Sodium: 135 mmol/L (ref 135–145)
Total Bilirubin: 1 mg/dL (ref 0.3–1.2)
Total Protein: 7.3 g/dL (ref 6.5–8.1)

## 2022-11-19 LAB — CBC WITH DIFFERENTIAL/PLATELET
Abs Immature Granulocytes: 0 10*3/uL (ref 0.00–0.07)
Basophils Absolute: 0.1 10*3/uL (ref 0.0–0.1)
Basophils Relative: 2 %
Eosinophils Absolute: 0 10*3/uL (ref 0.0–0.5)
Eosinophils Relative: 2 %
HCT: 42.6 % (ref 39.0–52.0)
Hemoglobin: 13.9 g/dL (ref 13.0–17.0)
Immature Granulocytes: 0 %
Lymphocytes Relative: 40 %
Lymphs Abs: 1 10*3/uL (ref 0.7–4.0)
MCH: 30.4 pg (ref 26.0–34.0)
MCHC: 32.6 g/dL (ref 30.0–36.0)
MCV: 93.2 fL (ref 80.0–100.0)
Monocytes Absolute: 0.2 10*3/uL (ref 0.1–1.0)
Monocytes Relative: 7 %
Neutro Abs: 1.3 10*3/uL — ABNORMAL LOW (ref 1.7–7.7)
Neutrophils Relative %: 49 %
Platelets: 199 10*3/uL (ref 150–400)
RBC: 4.57 MIL/uL (ref 4.22–5.81)
RDW: 12.8 % (ref 11.5–15.5)
WBC: 2.6 10*3/uL — ABNORMAL LOW (ref 4.0–10.5)
nRBC: 0 % (ref 0.0–0.2)

## 2022-11-19 NOTE — Progress Notes (Signed)
Has questions about medication that he is taking.

## 2022-11-19 NOTE — Progress Notes (Signed)
Hematology/Oncology Consult note [facility]  [phone] [phone]  Patient Care Team: Theresia Lo, NP as PCP - General (Nurse Practitioner) [name], MD as Consulting Physician (Oncology)   Name of the patient: Carl [name]  [id number]  [birth year]   Date of visit: 11/19/22  Diagnosis-history of AML in remission  Chief complaint/ Reason for visit-routine follow-up of AML on oral Vidaza  Heme/Onc history: Carl [name]. is a 47 y.o. 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 Gene Coding Predicted Protein Variant allele fraction  U2AF1 c.101C>T p.(Ser34Phe) 12.9 %  RUNX1 c.620_621insATCCCCCG p.(Gln208Serfs*6) 6.7 %  NRAS c.38G>A p.(Gly13Asp) 9.2 %   Variants of Unknown Clinical Significance:  Gene Coding Predicted Protein Variant allele fraction  TET2 c.4493G>A p.(Arg1498His) 48.2 %  ETV6 c.776G>A p.(Arg259Gln) 49.1 %   Pertinent Phenotypic data: blasts express CD7, CD13, CD34, CD38, CD71, CD117, and HLA-DR.   He received cytarabine and daunorubicin (7+3) beginning 04/13/2020 and 05/27/2020.  Course has been complicated by fever and neutropenia with mucositis (04/27/2020) and gluteal cleft cellulitis (05/06/2020).Received induction with 7+3+HD with delayed count recovery. D42 BMBx revealed persistent disease and patient re-induced with 7+3.  He then went on to receive consolidation therapy with HiDAC and cycle 4 received on 11/04/2020.  He follows up with Dr. Royce Macadamia at [facility]. Last bone marrow biopsy on 12/05/2020 which showed hypercellular bone marrow with trilineage hematopoiesis and 3% blasts by manual differential.   He is presently on azacitidine p.o. 300 mg daily 2 weeks on 2 weeks off.      Interval history-patient was diagnosed with C. difficile infection and he recently completed his course of oral antibiotics.  His Vidaza was on hold for that.  He will restart taking it today.  He does have  some nausea when he takes Vidaza for which she takes as needed Zofran.  States that he has chronic low back pain from his scoliosis which prevents him from lifting heavy weights  ECOG PS- 1 Pain scale- 0   Review of systems- Review of Systems  Constitutional:  Negative for chills, fever, malaise/fatigue and weight loss.  HENT:  Negative for congestion, ear discharge and nosebleeds.   Eyes:  Negative for blurred vision.  Respiratory:  Negative for cough, hemoptysis, sputum production, shortness of breath and wheezing.   Cardiovascular:  Negative for chest pain, palpitations, orthopnea and claudication.  Gastrointestinal:  Positive for nausea. Negative for abdominal pain, blood in stool, constipation, diarrhea, heartburn, melena and vomiting.  Genitourinary:  Negative for dysuria, flank pain, frequency, hematuria and urgency.  Musculoskeletal:  Positive for back pain. Negative for joint pain and myalgias.  Skin:  Negative for rash.  Neurological:  Negative for dizziness, tingling, focal weakness, seizures, weakness and headaches.  Endo/Heme/Allergies:  Does not bruise/bleed easily.  Psychiatric/Behavioral:  Negative for depression and suicidal ideas. The patient does not have insomnia.       Allergies  Allergen Reactions   Dapsone     Contraindication - G6PD deficient   Primaquine     Contraindication - G6PD deficient   Rasburicase     Contraindication - G6PD deficient     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[place])    Agranulocytosis secondary to cancer chemotherapy (CODE) ([place])    Chemotherapy induced neutropenia (H[facility])    Pancreatitis    Pancytopenia (HCC)    Thrombocytopenia (HCC)    Tobacco use disorder      Past  Surgical History:  Procedure Laterality Date   BONE MARROW BIOPSY     ESOPHAGOGASTRODUODENOSCOPY (EGD) WITH PROPOFOL N/A 07/24/2022   Procedure: ESOPHAGOGASTRODUODENOSCOPY (EGD) WITH PROPOFOL;  Surgeon: [name],  MD;  Location: [facility] ENDOSCOPY;  Service: Endoscopy;  Laterality: N/A;    Social History   Socioeconomic History   Marital status: Single    Spouse name: Not on file   Number of children: Not on file   Years of education: Not on file   Highest education level: Not on file  Occupational History   Not on file  Tobacco Use   Smoking status: Former    Packs/day: 1.00    Years: 17.00    Additional pack years: 0.00    Total pack years: 17.00    Types: Cigarettes    Quit date: 03/27/2020    Years since quitting: 2.6   Smokeless tobacco: Never  Vaping Use   Vaping Use: Never used  Substance and Sexual Activity   Alcohol use: Yes    Alcohol/week: 7.0 standard drinks of alcohol    Types: 7 Cans of beer per week    Comment: Socially   Drug use: Not Currently   Sexual activity: Yes  Other Topics Concern   Not on file  Social History Narrative   Not on file   Social Determinants of Health   Financial Resource Strain: Not on file  Food Insecurity: Not on file  Transportation Needs: Unmet Transportation Needs (11/19/2022)   PRAPARE - Hydrologist (Medical): Yes    Lack of Transportation (Non-Medical): Yes  Physical Activity: Not on file  Stress: Not on file  Social Connections: Not on file  Intimate Partner Violence: Not on file    Family History  Problem Relation Age of Onset   Hypertension Mother    Diabetes Mother    Diabetes Father    Prostate cancer Neg Hx    Kidney cancer Neg Hx    Bladder Cancer Neg Hx      Current Outpatient Medications:    azaCITIDine (ONUREG) 300 MG tablet, Take 1 tablet (300 mg total) by mouth daily. Take for 14 days, hold for 14 days. Repeat every 28 days., Disp: 14 tablet, Rfl: 6   glipiZIDE (GLUCOTROL) 5 MG tablet, TAKE 1 TABLET (5 MG TOTAL) BY MOUTH DAILY., Disp: 90 tablet, Rfl: 1   miconazole (MICOTIN) 2 % cream, Apply 1 Application topically 2 (two) times daily., Disp: 28.35 g, Rfl: 0   omeprazole (PRILOSEC)  20 MG capsule, TAKE 1 CAPSULE BY MOUTH EVERY DAY, Disp: 90 capsule, Rfl: 2   ondansetron (ZOFRAN) 4 MG tablet, TAKE 1 TABLET BY MOUTH EVERY 8 HOURS AS NEEDED FOR NAUSEA AND VOMITING, Disp: 60 tablet, Rfl: 0   prochlorperazine (COMPAZINE) 10 MG tablet, Take 1 tablet (10 mg total) by mouth every 6 (six) hours as needed for nausea or vomiting., Disp: 20 tablet, Rfl: 0   promethazine (PHENERGAN) 25 MG tablet, Take 1 tablet (25 mg total) by mouth every 8 (eight) hours as needed for refractory nausea / vomiting. Medication may cause sleepiness. Do not drive with this medication., Disp: 30 tablet, Rfl: 0   valACYclovir (VALTREX) 500 MG tablet, TAKE 1 TABLET (500 MG TOTAL) BY MOUTH DAILY., Disp: 90 tablet, Rfl: 1   vancomycin (VANCOCIN) 125 MG capsule, Take 1 capsule (125 mg total) by mouth every 6 (six) hours for 10 days. For c difficile infection, Disp: 40 capsule, Rfl: 0  Physical exam:  Vitals:  11/19/22 0922  BP: 120/65  Pulse: 77  Resp: 18  Temp: 98.4 F (36.9 C)  TempSrc: Tympanic  SpO2: 99%  Weight: 207 lb 1.6 oz (93.9 kg)  Height: 5\' 9"  (1.753 m)   Physical Exam Cardiovascular:     Rate and Rhythm: Normal rate and regular rhythm.     Heart sounds: Normal heart sounds.  Pulmonary:     Effort: Pulmonary effort is normal.     Breath sounds: Normal breath sounds.  Abdominal:     General: Bowel sounds are normal.     Palpations: Abdomen is soft.  Skin:    General: Skin is warm and dry.  Neurological:     Mental Status: He is alert and oriented to person, place, and time.         Latest Ref Rng & Units 11/19/2022    9:13 AM  CMP  Glucose 70 - 99 mg/dL 69   BUN 6 - 20 mg/dL 13   Creatinine 0.61 - 1.24 mg/dL 1.00   Sodium 135 - 145 mmol/L 135   Potassium 3.5 - 5.1 mmol/L 3.7   Chloride 98 - 111 mmol/L 103   CO2 22 - 32 mmol/L 26   Calcium 8.9 - 10.3 mg/dL 8.9   Total Protein 6.5 - 8.1 g/dL 7.3   Total Bilirubin 0.3 - 1.2 mg/dL 1.0   Alkaline Phos 38 - 126 U/L 65   AST 15  - 41 U/L 30   ALT 0 - 44 U/L 56       Latest Ref Rng & Units 11/19/2022    9:13 AM  CBC  WBC 4.0 - 10.5 K/uL 2.6   Hemoglobin 13.0 - 17.0 g/dL 13.9   Hematocrit 39.0 - 52.0 % 42.6   Platelets 150 - 400 K/uL 199      Assessment and plan- Patient is a 47 y.o. male with history of AML on oral Vidaza here for routine follow-up  Patient has been longstanding mild leukopenia/neutropenia which has remained overall stable.  Hemoglobin and platelets are normal.  He will restart taking his Vidaza today and will take it 2 weeks on and 2 weeks off.  He will take as needed Zofran for nausea associated with it.  I will continue to monitor his labs on a monthly basis and see him back in 3 months.   Visit Diagnosis 1. AML (acute myeloid leukemia) in remission ([place])   2. High risk medication use   3. Drug-induced neutropenia ([place])      Dr. Randa Evens, MD, MPH [facility] [id number] 11/19/2022 11:54 AM

## 2022-11-23 ENCOUNTER — Encounter: Payer: Self-pay | Admitting: Hematology and Oncology

## 2022-11-26 ENCOUNTER — Encounter: Payer: Self-pay | Admitting: Hematology and Oncology

## 2022-11-26 NOTE — Assessment & Plan Note (Signed)
Will check hemoglobin A1c. Continue glipizide 5 Mg daily.

## 2022-11-26 NOTE — Assessment & Plan Note (Signed)
Stable at present. Advised to continue use of Voltaren gel and heat/ice.

## 2022-11-26 NOTE — Assessment & Plan Note (Signed)
He is followed by oncologist Dr. Janese Banks.

## 2022-11-27 NOTE — Progress Notes (Signed)
Hgb A1c 4.6 WNL

## 2022-11-30 ENCOUNTER — Encounter: Payer: Self-pay | Admitting: Urology

## 2022-11-30 ENCOUNTER — Ambulatory Visit (INDEPENDENT_AMBULATORY_CARE_PROVIDER_SITE_OTHER): Payer: Medicare Other | Admitting: Urology

## 2022-11-30 VITALS — BP 117/69 | HR 88 | Ht 69.0 in | Wt 197.0 lb

## 2022-11-30 DIAGNOSIS — N529 Male erectile dysfunction, unspecified: Secondary | ICD-10-CM | POA: Diagnosis not present

## 2022-11-30 MED ORDER — SILDENAFIL CITRATE 20 MG PO TABS: ORAL_TABLET | ORAL | 11 refills | Status: AC

## 2022-11-30 NOTE — Patient Instructions (Signed)
Erectile Dysfunction ?Erectile dysfunction (ED) is the inability to get or keep an erection in order to have sexual intercourse. ED is considered a symptom of an underlying disorder and is not considered a disease. ED may include: ?Inability to get an erection. ?Lack of enough hardness of the erection to allow penetration. ?Loss of erection before sex is finished. ?What are the causes? ?This condition may be caused by: ?Physical causes, such as: ?Artery problems. This may include heart disease, high blood pressure, atherosclerosis, and diabetes. ?Hormonal problems, such as low testosterone. ?Obesity. ?Nerve problems. This may include back or pelvic injuries, multiple sclerosis, Parkinson's disease, spinal cord injury, and stroke. ?Certain medicines, such as: ?Pain relievers. ?Antidepressants. ?Blood pressure medicines and water pills (diuretics). ?Cancer medicines. ?Antihistamines. ?Muscle relaxants. ?Lifestyle factors, such as: ?Use of drugs such as marijuana, cocaine, or opioids. ?Excessive use of alcohol. ?Smoking. ?Lack of physical activity or exercise. ?Psychological causes, such as: ?Anxiety or stress. ?Sadness or depression. ?Exhaustion. ?Fear about sexual performance. ?Guilt. ?What are the signs or symptoms? ?Symptoms of this condition include: ?Inability to get an erection. ?Lack of enough hardness of the erection to allow penetration. ?Loss of the erection before sex is finished. ?Sometimes having normal erections, but with frequent unsatisfactory episodes. ?Low sexual satisfaction in either partner due to erection problems. ?A curved penis occurring with erection. The curve may cause pain, or the penis may be too curved to allow for intercourse. ?Never having nighttime or morning erections. ?How is this diagnosed? ?This condition is often diagnosed by: ?Performing a physical exam to find other diseases or specific problems with the penis. ?Asking you detailed questions about the problem. ?Doing tests,  such as: ?Blood tests to check for diabetes mellitus or high cholesterol, or to measure hormone levels. ?Other tests to check for underlying health conditions. ?An ultrasound exam to check for scarring. ?A test to check blood flow to the penis. ?Doing a sleep study at home to measure nighttime erections. ?How is this treated? ?This condition may be treated by: ?Medicines, such as: ?Medicine taken by mouth to help you achieve an erection (oral medicine). ?Hormone replacement therapy to replace low testosterone levels. ?Medicine that is injected into the penis. Your health care provider may instruct you how to give yourself these injections at home. ?Medicine that is delivered with a short applicator tube. The tube is inserted into the opening at the tip of the penis, which is the opening of the urethra. A tiny pellet of medicine is put in the urethra. The pellet dissolves and enhances erectile function. This is also called MUSE (medicated urethral system for erections) therapy. ?Vacuum pump. This is a pump with a ring on it. The pump and ring are placed on the penis and used to create pressure that helps the penis become erect. ?Penile implant surgery. In this procedure, you may receive: ?An inflatable implant. This consists of cylinders, a pump, and a reservoir. The cylinders can be inflated with a fluid that helps to create an erection, and they can be deflated after intercourse. ?A semi-rigid implant. This consists of two silicone rubber rods. The rods provide some rigidity. They are also flexible, so the penis can both curve downward in its normal position and become straight for sexual intercourse. ?Blood vessel surgery to improve blood flow to the penis. During this procedure, a blood vessel from a different part of the body is placed into the penis to allow blood to flow around (bypass) damaged or blocked blood vessels. ?Lifestyle changes,   such as exercising more, losing weight, and quitting smoking. ?Follow  these instructions at home: ?Medicines ? ?Take over-the-counter and prescription medicines only as told by your health care provider. Do not increase the dosage without first discussing it with your health care provider. ?If you are using self-injections, do injections as directed by your health care provider. Make sure you avoid any veins that are on the surface of the penis. After giving an injection, apply pressure to the injection site for 5 minutes. ?Talk to your health care provider about how to prevent headaches while taking ED medicines. These medicines may cause a sudden headache due to the increase in blood flow in your body. ?General instructions ?Exercise regularly, as directed by your health care provider. Work with your health care provider to lose weight, if needed. ?Do not use any products that contain nicotine or tobacco. These products include cigarettes, chewing tobacco, and vaping devices, such as e-cigarettes. If you need help quitting, ask your health care provider. ?Before using a vacuum pump, read the instructions that come with the pump and discuss any questions with your health care provider. ?Keep all follow-up visits. This is important. ?Contact a health care provider if: ?You feel nauseous. ?You are vomiting. ?You get sudden headaches while taking ED medicines. ?You have any concerns about your sexual health. ?Get help right away if: ?You are taking oral or injectable medicines and you have an erection that lasts longer than 4 hours. If your health care provider is unavailable, go to the nearest emergency room for evaluation. An erection that lasts much longer than 4 hours can result in permanent damage to your penis. ?You have severe pain in your groin or abdomen. ?You develop redness or severe swelling of your penis. ?You have redness spreading at your groin or lower abdomen. ?You are unable to urinate. ?You experience chest pain or a rapid heartbeat (palpitations) after taking oral  medicines. ?These symptoms may represent a serious problem that is an emergency. Do not wait to see if the symptoms will go away. Get medical help right away. Call your local emergency services (911 in the [place] Do not drive yourself to the hospital. ?Summary ?Erectile dysfunction (ED) is the inability to get or keep an erection during sexual intercourse. ?This condition is diagnosed based on a physical exam, your symptoms, and tests to determine the cause. Treatment varies depending on the cause and may include medicines, hormone therapy, surgery, or a vacuum pump. ?You may need follow-up visits to make sure that you are using your medicines or devices correctly. ?Get help right away if you are taking or injecting medicines and you have an erection that lasts longer than 4 hours. ?This information is not intended to replace advice given to you by your health care provider. Make sure you discuss any questions you have with your health care provider. ?Document Revised: 11/09/2020 Document Reviewed: 11/09/2020 ?Elsevier Patient Education ? 2023 [employer] ? ?

## 2022-11-30 NOTE — Progress Notes (Signed)
Carl [name], Carl [name],acting as a scribe for Carl ScotlandAshley Micheala Morissette, MD.,have documented all relevant documentation on the behalf of Carl Scotlandshley Alison Breeding, MD,as directed by  Carl ScotlandAshley Apoorva Bugay, MD while in the presence of Carl ScotlandAshley Teshawn Moan, MD.  11/30/2022 10:28 AM   Carl CruiseNapoleon Gwyneth SproutBrown Jr. 01/17/1976 [id number]  Referring provider: Creig Hinesao, Archana C, MD [address] [place] [id number]  Chief Complaint  Patient presents with   Erectile Dysfunction    HPI: 47 year old male, presents today for evaluation of erectile dysfunction.   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 He is currently in remission.  Today, he reports intermittent difficulty in both achieving and maintaining an erection. He states that this issue has been ongoing since around April 2021, coinciding with his chemotherapy treatment with additional situational factors possibly contributing to the problem. He has not previously tried any pharmacological interventions for this issue.   He reports mood swings and concentration difficulties. He is in a relationship with a partner who has bipolar disorder, which he feels may contribute to situational stress.   PMH: Past Medical History:  Diagnosis Date   Acute myeloid leukemia in remission    Agranulocytosis secondary to cancer chemotherapy (CODE)    Chemotherapy induced neutropenia    Pancreatitis    Pancytopenia    Thrombocytopenia    Tobacco use disorder     Surgical History: Past Surgical History:  Procedure Laterality Date   BONE MARROW BIOPSY     ESOPHAGOGASTRODUODENOSCOPY (EGD) WITH PROPOFOL N/A 07/24/2022   Procedure: ESOPHAGOGASTRODUODENOSCOPY (EGD) WITH PROPOFOL;  Surgeon: Midge MiniumWohl, Darren, MD;  Location: [facility] END[facility]COPY;  Service: Endoscopy;  Laterality: N/A;    Home Medications:  Allergies as of 11/30/2022       Reactions   Dapsone    Contraindication - G6PD deficient   Primaquine     Contraindication - G6PD deficient   Rasburicase    Contraindication - G6PD deficient        Medication List        Accurate as of November 30, 2022 10:28 AM. If you have any questions, ask your nurse or doctor.          STOP taking these medications    ondansetron 4 MG tablet Commonly known as: ZOFRAN Stopped by: Carl ScotlandAshley Kyrie Bun, MD   prochlorperazine 10 MG tablet Commonly known as: COMPAZINE Stopped by: Carl ScotlandAshley Galit Urich, MD   promethazine 25 MG tablet Commonly known as: PHENERGAN Stopped by: Carl ScotlandAshley Byran Bilotti, MD       TAKE these medications    azaCITIDine 300 MG tablet Commonly known as: ONUREG Take 1 tablet (300 mg total) by mouth daily. Take for 14 days, hold for 14 days. Repeat every 28 days.   glipiZIDE 5 MG tablet Commonly known as: GLUCOTROL TAKE 1 TABLET (5 MG TOTAL) BY MOUTH DAILY.   miconazole 2 % cream Commonly known as: MICOTIN Apply 1 Application topically 2 (two) times daily.   omeprazole 20 MG capsule Commonly known as: PRILOSEC TAKE 1 CAPSULE BY MOUTH EVERY DAY   sildenafil 20 MG tablet Commonly known as: REVATIO Take 1-5 tablets by mouth 1 hour prior to intercourse Started by: Carl ScotlandAshley Chanah Tidmore, MD   valACYclovir 500 MG tablet Commonly known as: VALTREX TAKE 1 TABLET (500 MG TOTAL) BY MOUTH DAILY.        Allergies:  Allergies  Allergen Reactions   Dapsone     Contraindication - G6PD deficient   Primaquine  Contraindication - G6PD deficient   Rasburicase     Contraindication - G6PD deficient    Family History: Family History  Problem Relation Age of Onset   Hypertension Mother    Diabetes Mother    Diabetes Father    Prostate cancer Neg Hx    Kidney cancer Neg Hx    Bladder Cancer Neg Hx     Social History:  reports that he quit smoking about 2 years ago. His smoking use included cigarettes. He has a 17.00 pack-year smoking history. He has never used smokeless tobacco. He reports current alcohol use of about 7.0 standard  drinks of alcohol per week. He reports that he does not currently use drugs.   Physical Exam: BP 117/69 (BP Location: Left Arm, Patient Position: Sitting, Cuff Size: Large)   Pulse 88   Ht 5\' 9"  (1.753 m)   Wt 197 lb (89.4 kg)   BMI 29.09 kg/m   Constitutional:  Alert and oriented, No acute distress. HEENT: [place] AT, moist mucus membranes.  Trachea midline, no masses. Neurologic: Grossly intact, no focal deficits, moving all 4 extremities. Psychiatric: Normal mood and affect.   Assessment & Plan:    1. Erectile dysfunction - Plan to initiate treatment with Sildenafil - We discussed the pathophysiology of erectile dysfunction today along with possible contributing factors. Discussed possible treatment options including PDE 5 inhibitors, vacuum erectile device, intracavernosal injection, MUSE, and placement of the inflatable or malleable penile prosthesis for refractory cases.  In terms of PDE 5 inhibitors, we discussed contraindications for this medication as well as common side effects. Patient was counseled on optimal use. All of his questions were answered in detail.  - Follow-up as needed if medication is ineffective or if he experiences any adverse effects, with consideration for alternative treatments.   Return if symptoms worsen or fail to improve.  I have reviewed the above documentation for accuracy and completeness, and I agree with the above.   Carl Scotland, MD    [facility] [address] [place], [place] [address] [phone]

## 2022-12-13 ENCOUNTER — Encounter: Payer: Self-pay | Admitting: Hematology and Oncology

## 2022-12-20 ENCOUNTER — Inpatient Hospital Stay: Payer: Medicare Other

## 2022-12-20 ENCOUNTER — Other Ambulatory Visit: Payer: Medicare Other

## 2022-12-21 ENCOUNTER — Inpatient Hospital Stay: Payer: Medicare Other

## 2022-12-21 ENCOUNTER — Inpatient Hospital Stay: Payer: Medicare Other | Attending: Oncology

## 2022-12-21 DIAGNOSIS — C9201 Acute myeloblastic leukemia, in remission: Secondary | ICD-10-CM | POA: Diagnosis present

## 2022-12-21 LAB — CBC WITH DIFFERENTIAL/PLATELET
Abs Immature Granulocytes: 0.01 10*3/uL (ref 0.00–0.07)
Basophils Absolute: 0 10*3/uL (ref 0.0–0.1)
Basophils Relative: 1 %
Eosinophils Absolute: 0 10*3/uL (ref 0.0–0.5)
Eosinophils Relative: 2 %
HCT: 43.5 % (ref 39.0–52.0)
Hemoglobin: 14.2 g/dL (ref 13.0–17.0)
Immature Granulocytes: 0 %
Lymphocytes Relative: 39 %
Lymphs Abs: 1 10*3/uL (ref 0.7–4.0)
MCH: 30.8 pg (ref 26.0–34.0)
MCHC: 32.6 g/dL (ref 30.0–36.0)
MCV: 94.4 fL (ref 80.0–100.0)
Monocytes Absolute: 0.2 10*3/uL (ref 0.1–1.0)
Monocytes Relative: 8 %
Neutro Abs: 1.3 10*3/uL — ABNORMAL LOW (ref 1.7–7.7)
Neutrophils Relative %: 50 %
Platelets: 175 10*3/uL (ref 150–400)
RBC: 4.61 MIL/uL (ref 4.22–5.81)
RDW: 13.7 % (ref 11.5–15.5)
WBC: 2.5 10*3/uL — ABNORMAL LOW (ref 4.0–10.5)
nRBC: 0 % (ref 0.0–0.2)

## 2022-12-21 LAB — COMPREHENSIVE METABOLIC PANEL
ALT: 34 U/L (ref 0–44)
AST: 24 U/L (ref 15–41)
Albumin: 4 g/dL (ref 3.5–5.0)
Alkaline Phosphatase: 75 U/L (ref 38–126)
Anion gap: 8 (ref 5–15)
BUN: 16 mg/dL (ref 6–20)
CO2: 25 mmol/L (ref 22–32)
Calcium: 9 mg/dL (ref 8.9–10.3)
Chloride: 102 mmol/L (ref 98–111)
Creatinine, Ser: 1.03 mg/dL (ref 0.61–1.24)
GFR, Estimated: >60 mL/min (ref >60)
Glucose, Bld: 114 mg/dL — ABNORMAL HIGH (ref 70–99)
Potassium: 4.3 mmol/L (ref 3.5–5.1)
Sodium: 135 mmol/L (ref 135–145)
Total Bilirubin: 0.9 mg/dL (ref 0.3–1.2)
Total Protein: 7.1 g/dL (ref 6.5–8.1)

## 2023-01-07 ENCOUNTER — Telehealth: Payer: Self-pay

## 2023-01-07 ENCOUNTER — Telehealth: Payer: Self-pay | Admitting: *Deleted

## 2023-01-07 NOTE — Telephone Encounter (Signed)
I called the pt from the message he sent about groin is hurting. He can see Josh tues. And ride the [place] and see Josh. He will need to ready by 11:30  and the Zenaida Niece will call pt to let him know when to be ready. 1 pm appt for symptom managament. All of this on his cell phone message that I left

## 2023-01-07 NOTE — Telephone Encounter (Signed)
Patient states he having pain in his scrotum. Patient states this pain has been going on for a week. Patient states he is having pain while walking and moving a certain way. Patient states he has had this same exact pain 6 years ago.

## 2023-01-08 ENCOUNTER — Encounter: Payer: Self-pay | Admitting: Hospice and Palliative Medicine

## 2023-01-08 ENCOUNTER — Inpatient Hospital Stay: Payer: Medicare Other | Attending: Oncology

## 2023-01-08 ENCOUNTER — Inpatient Hospital Stay: Payer: Medicare Other

## 2023-01-08 ENCOUNTER — Telehealth: Payer: Self-pay | Admitting: Oncology

## 2023-01-08 ENCOUNTER — Inpatient Hospital Stay (HOSPITAL_BASED_OUTPATIENT_CLINIC_OR_DEPARTMENT_OTHER): Payer: Medicare Other | Admitting: Hospice and Palliative Medicine

## 2023-01-08 VITALS — BP 113/68 | HR 78 | Temp 97.6°F | Resp 19 | Wt 202.8 lb

## 2023-01-08 DIAGNOSIS — R110 Nausea: Secondary | ICD-10-CM | POA: Diagnosis not present

## 2023-01-08 DIAGNOSIS — N50812 Left testicular pain: Secondary | ICD-10-CM | POA: Insufficient documentation

## 2023-01-08 DIAGNOSIS — C9201 Acute myeloblastic leukemia, in remission: Secondary | ICD-10-CM | POA: Diagnosis not present

## 2023-01-08 DIAGNOSIS — R300 Dysuria: Secondary | ICD-10-CM | POA: Diagnosis not present

## 2023-01-08 DIAGNOSIS — N5082 Scrotal pain: Secondary | ICD-10-CM | POA: Diagnosis not present

## 2023-01-08 LAB — URINALYSIS, COMPLETE (UACMP) WITH MICROSCOPIC
Bacteria, UA: NONE SEEN
Bilirubin Urine: NEGATIVE
Glucose, UA: NEGATIVE mg/dL
Hgb urine dipstick: NEGATIVE
Ketones, ur: NEGATIVE mg/dL
Leukocytes,Ua: NEGATIVE
Nitrite: NEGATIVE
Protein, ur: NEGATIVE mg/dL
Specific Gravity, Urine: 1.02 (ref 1.005–1.030)
pH: 6 (ref 5.0–8.0)

## 2023-01-08 MED ORDER — CELECOXIB 100 MG PO CAPS: 100.0000 mg | ORAL_CAPSULE | Freq: Two times a day (BID) | ORAL | 0 refills | Status: AC

## 2023-01-08 NOTE — Progress Notes (Signed)
Symptom Management Clinic [facility] [phone] [phone]  Patient Care Team: Kara Dies, NP as PCP - General (Nurse Practitioner) Creig Hines, MD as Consulting Physician (Oncology)   NAME OF PATIENT: Carl [name]  [id number]  1976-06-09   DATE OF VISIT: 01/08/23  REASON FOR CONSULT: Carl Hakan Brackeen. is a 46 y.o. male with multiple medical problems including history of AML in remission, currently on treatment with Vidaza.   INTERVAL HISTORY: Patient presents for evaluation of testicular pain.  He was seen for same complaint by me in 2022 at which time he had a negative testicular ultrasound followed by evaluation with urology.  Patient has been seen for same complaint by urology several times in the past.  He was prescribed NSAIDs.  Today, patient presents with left testicular pain, which she describes as being intermittently present over the past 2 weeks.  He states it is not currently painful but was intensely painful yesterday.  He denies trauma.  Denies changes in sexual patterns and is in a monogamous relationship.  Denies urethral drainage or discharge.  Denies dysuria, urinary frequency or urgency.  No fever or chills.  No rashes.  Patient also endorses intermittent nausea associated with his Vidaza.  Denies any neurologic complaints. Denies recent fevers or illnesses. Denies any easy bleeding or bruising. Reports good appetite and denies weight loss. Denies chest pain. Denies any constipation, or diarrhea. Denies urinary complaints. Patient offers no further specific complaints today.   PAST MEDICAL HISTORY: 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HCC)    Agranulocytosis secondary to cancer chemotherapy (CODE) (HCC)    Chemotherapy induced neutropenia (HCC)    Pancreatitis    Pancytopenia (HCC)    Thrombocytopenia (HCC)    Tobacco use disorder     PAST  SURGICAL HISTORY:  Past Surgical History:  Procedure Laterality Date   BONE MARROW BIOPSY     ESOPHAGOGASTRODUODENOSCOPY (EGD) WITH PROPOFOL N/A 07/24/2022   Procedure: ESOPHAGOGASTRODUODENOSCOPY (EGD) WITH PROPOFOL;  Surgeon: Midge Minium, MD;  Location: [facility] ENDOSCOPY;  Service: Endoscopy;  Laterality: N/A;    HEMATOLOGY/ONCOLOGY HISTORY:  Oncology History   No history exists.    ALLERGIES:  is allergic to dapsone, primaquine, and rasburicase.  MEDICATIONS:  Current Outpatient Medications  Medication Sig Dispense Refill   azaCITIDine (ONUREG) 300 MG tablet Take 1 tablet (300 mg total) by mouth daily. Take for 14 days, hold for 14 days. Repeat every 28 days. 14 tablet 6   glipiZIDE (GLUCOTROL) 5 MG tablet TAKE 1 TABLET (5 MG TOTAL) BY MOUTH DAILY. 90 tablet 1   miconazole (MICOTIN) 2 % cream Apply 1 Application topically 2 (two) times daily. 28.35 g 0   omeprazole (PRILOSEC) 20 MG capsule TAKE 1 CAPSULE BY MOUTH EVERY DAY (Patient not taking: Reported on 11/30/2022) 90 capsule 2   sildenafil (REVATIO) 20 MG tablet Take 1-5 tablets by mouth 1 hour prior to intercourse 30 tablet 11   valACYclovir (VALTREX) 500 MG tablet TAKE 1 TABLET (500 MG TOTAL) BY MOUTH DAILY. 90 tablet 1   No current facility-administered medications for this visit.    VITAL SIGNS: There were no vitals taken for this visit. There were no vitals filed for this visit.  Estimated body mass index is 29.09 kg/m as calculated from the following:   Height as of 11/30/22: 5\' 9"  (1.753 m).   Weight as of 11/30/22: 197 lb (89.4 kg).  LABS: CBC:  Component Value Date/Time   WBC 2.5 (L) 12/21/2022 0907   HGB 14.2 12/21/2022 0907   HGB 13.7 11/05/2022 0937   HCT 43.5 12/21/2022 0907   HCT 44.2 03/29/2020 0826   PLT 175 12/21/2022 0907   PLT 168 11/05/2022 0937   MCV 94.4 12/21/2022 0907   NEUTROABS 1.3 (L) 12/21/2022 0907   LYMPHSABS 1.0 12/21/2022 0907   MONOABS 0.2 12/21/2022 0907   EOSABS 0.0 12/21/2022 0907    BASOSABS 0.0 12/21/2022 0907   Comprehensive Metabolic Panel:    Component Value Date/Time   NA 135 12/21/2022 0907   K 4.3 12/21/2022 0907   CL 102 12/21/2022 0907   CO2 25 12/21/2022 0907   BUN 16 12/21/2022 0907   CREATININE 1.03 12/21/2022 0907   CREATININE 1.02 11/05/2022 0937   CREATININE 0.94 12/10/2019 0859   GLUCOSE 114 (H) 12/21/2022 0907   CALCIUM 9.0 12/21/2022 0907   AST 24 12/21/2022 0907   AST 29 11/05/2022 0937   ALT 34 12/21/2022 0907   ALT 45 (H) 11/05/2022 0937   ALKPHOS 75 12/21/2022 0907   BILITOT 0.9 12/21/2022 0907   BILITOT 0.9 11/05/2022 0937   PROT 7.1 12/21/2022 0907   ALBUMIN 4.0 12/21/2022 0907    RADIOGRAPHIC STUDIES: No results found.  PERFORMANCE STATUS (ECOG) : 1 - Symptomatic but completely ambulatory  Review of Systems Unless otherwise noted, a complete review of systems is negative.  Physical Exam General: NAD Cardiovascular: regular rate and rhythm Pulmonary: clear ant fields Abdomen: soft, nontender, + bowel sounds GU: no suprapubic tenderness, no hernias appreciated, no urethral drainage or discharge, no rashes, testicles symmetrical without masses, no scrotal swelling appreciated Extremities: no edema, no joint deformities Skin: no rashes Neurological: Weakness but otherwise nonfocal  Exam chaperoned by [place], CMA  IMPRESSION/PLAN: Testicular pain -this appears to be a chronic/recurrent issue for patient dating back to at least 2018.  UA negative.  Patient is in a monogamous relationship and denies concern for STI.  Will send for scrotal ultrasound to evaluate for acute changes.  However, patient advised to follow-up with urology.  Will send prescription for Celebrex.  Nausea -likely secondary to Vidaza.  Encouraged utilization of antiemetics.   Patient expressed understanding and was in agreement with this plan. He also understands that He can call clinic at any time with any questions, concerns, or complaints.   Thank  you for allowing me to participate in the care of this very pleasant patient.   Time Total: 20 minutes  Visit consisted of counseling and education dealing with the complex and emotionally intense issues of symptom management in the setting of serious illness.Greater than 50%  of this time was spent counseling and coordinating care related to the above assessment and plan.  Signed by: Laurette Schimke, PhD, NP-C

## 2023-01-08 NOTE — Progress Notes (Signed)
Patient states he has been having this pain in the scrotum area for the last week. Patient states he had this pain back in 2019, and 2022. Patient states he is having pain while working, laying down in certain positions. Patient denies any burring, itching or swelling in this region. Patient also states he is having stomach issues. Patient isn't able to keep down his cancer medication most of the time. Patient states sometimes when he takes his cancer medication he is nauseated by the smell or even the taste. Patient states he has been having GI issues for a while now but never has he thrown up his cancer medication.  Patient states he has noticed the last few weeks his heart rate is exteremly faster than normal he states he notice it when he wakes up or just sitting on the couch.

## 2023-01-09 ENCOUNTER — Inpatient Hospital Stay: Payer: Medicare Other

## 2023-01-09 ENCOUNTER — Other Ambulatory Visit: Payer: Medicare Other

## 2023-01-10 ENCOUNTER — Other Ambulatory Visit: Payer: Self-pay | Admitting: *Deleted

## 2023-01-10 ENCOUNTER — Inpatient Hospital Stay: Payer: Medicare Other

## 2023-01-10 ENCOUNTER — Ambulatory Visit
Admission: RE | Admit: 2023-01-10 | Discharge: 2023-01-10 | Disposition: A | Discharge status: Home / Self Care | Payer: Medicare Other | Source: Ambulatory Visit | Attending: Hospice and Palliative Medicine | Admitting: Hospice and Palliative Medicine

## 2023-01-10 DIAGNOSIS — N5082 Scrotal pain: Secondary | ICD-10-CM

## 2023-01-10 DIAGNOSIS — N5089 Other specified disorders of the male genital organs: Secondary | ICD-10-CM

## 2023-01-10 LAB — URINE CULTURE: Culture: <10000 — AB

## 2023-01-22 ENCOUNTER — Other Ambulatory Visit: Payer: Medicare Other

## 2023-01-22 ENCOUNTER — Inpatient Hospital Stay: Payer: Medicare Other

## 2023-01-22 DIAGNOSIS — C9201 Acute myeloblastic leukemia, in remission: Secondary | ICD-10-CM

## 2023-01-22 LAB — CBC WITH DIFFERENTIAL/PLATELET
Abs Immature Granulocytes: 0 10*3/uL (ref 0.00–0.07)
Basophils Absolute: 0 10*3/uL (ref 0.0–0.1)
Basophils Relative: 1 %
Eosinophils Absolute: 0 10*3/uL (ref 0.0–0.5)
Eosinophils Relative: 1 %
HCT: 41.2 % (ref 39.0–52.0)
Hemoglobin: 14.5 g/dL (ref 13.0–17.0)
Immature Granulocytes: 0 %
Lymphocytes Relative: 38 %
Lymphs Abs: 0.8 10*3/uL (ref 0.7–4.0)
MCH: 32.8 pg (ref 26.0–34.0)
MCHC: 35.2 g/dL (ref 30.0–36.0)
MCV: 93.2 fL (ref 80.0–100.0)
Monocytes Absolute: 0.1 10*3/uL (ref 0.1–1.0)
Monocytes Relative: 5 %
Neutro Abs: 1.1 10*3/uL — ABNORMAL LOW (ref 1.7–7.7)
Neutrophils Relative %: 55 %
Platelets: 181 10*3/uL (ref 150–400)
RBC: 4.42 MIL/uL (ref 4.22–5.81)
RDW: 13.4 % (ref 11.5–15.5)
WBC: 2.1 10*3/uL — ABNORMAL LOW (ref 4.0–10.5)
nRBC: 0 % (ref 0.0–0.2)

## 2023-01-22 LAB — COMPREHENSIVE METABOLIC PANEL
ALT: 53 U/L — ABNORMAL HIGH (ref 0–44)
AST: 46 U/L — ABNORMAL HIGH (ref 15–41)
Albumin: 4.2 g/dL (ref 3.5–5.0)
Alkaline Phosphatase: 102 U/L (ref 38–126)
Anion gap: 14 (ref 5–15)
BUN: 19 mg/dL (ref 6–20)
CO2: 18 mmol/L — ABNORMAL LOW (ref 22–32)
Calcium: 8.6 mg/dL — ABNORMAL LOW (ref 8.9–10.3)
Chloride: 105 mmol/L (ref 98–111)
Creatinine, Ser: 1.02 mg/dL (ref 0.61–1.24)
GFR, Estimated: >60 mL/min (ref >60)
Glucose, Bld: 134 mg/dL — ABNORMAL HIGH (ref 70–99)
Potassium: 3.9 mmol/L (ref 3.5–5.1)
Sodium: 137 mmol/L (ref 135–145)
Total Bilirubin: 0.8 mg/dL (ref 0.3–1.2)
Total Protein: 6.8 g/dL (ref 6.5–8.1)

## 2023-01-29 ENCOUNTER — Other Ambulatory Visit: Payer: Self-pay | Admitting: Oncology

## 2023-01-29 DIAGNOSIS — C9201 Acute myeloblastic leukemia, in remission: Secondary | ICD-10-CM

## 2023-01-29 MED ORDER — AZACITIDINE 300 MG PO TABS
ORAL_TABLET | ORAL | 5 refills | Status: AC
Start: 2023-01-29 — End: ?

## 2023-01-29 NOTE — Addendum Note (Signed)
Addended by: Remi Haggard on: 01/29/2023 02:35 PM   Modules accepted: Orders

## 2023-02-01 ENCOUNTER — Telehealth: Payer: Self-pay

## 2023-02-01 NOTE — Telephone Encounter (Signed)
Noted  

## 2023-02-01 NOTE — Telephone Encounter (Signed)
Patient called and note was read to patient. 

## 2023-02-01 NOTE — Telephone Encounter (Signed)
Received disabilty paperwork from [employer] benefits center called and LVM  to inform pt that the paperwork needs to be sent to Cancer dr .

## 2023-02-05 ENCOUNTER — Encounter: Payer: Self-pay | Admitting: Hematology and Oncology

## 2023-02-12 ENCOUNTER — Ambulatory Visit: Payer: Medicare Other | Admitting: Urology

## 2023-02-12 ENCOUNTER — Encounter: Payer: Self-pay | Admitting: Urology

## 2023-02-18 ENCOUNTER — Telehealth: Payer: Self-pay

## 2023-02-18 NOTE — Telephone Encounter (Signed)
Sherwin called from [employer] to verify that we received paperwork that they faxed to [name] on 01/28/2023.  Sherwin asked when we call back [phone]), please use reference [id number].

## 2023-02-21 ENCOUNTER — Encounter: Payer: Self-pay | Admitting: Hematology and Oncology

## 2023-02-25 ENCOUNTER — Encounter: Payer: Self-pay | Admitting: Hematology and Oncology

## 2023-02-27 NOTE — Telephone Encounter (Signed)
Forms received on 02/18/23 & printed on 02/27/23.  I have completed form & attached March office note & faxed back.

## 2023-03-04 ENCOUNTER — Ambulatory Visit: Payer: Medicare Other | Admitting: Oncology

## 2023-03-04 ENCOUNTER — Other Ambulatory Visit: Payer: Medicare Other

## 2023-03-04 ENCOUNTER — Inpatient Hospital Stay: Payer: Medicare Other | Attending: Oncology

## 2023-03-04 ENCOUNTER — Inpatient Hospital Stay: Payer: Medicare Other

## 2023-03-04 ENCOUNTER — Encounter: Payer: Self-pay | Admitting: Oncology

## 2023-03-04 ENCOUNTER — Inpatient Hospital Stay (HOSPITAL_BASED_OUTPATIENT_CLINIC_OR_DEPARTMENT_OTHER): Payer: Medicare Other | Admitting: Oncology

## 2023-03-04 VITALS — BP 116/68 | HR 63 | Temp 96.3°F | Ht 69.0 in | Wt 207.6 lb

## 2023-03-04 DIAGNOSIS — Z79899 Other long term (current) drug therapy: Secondary | ICD-10-CM

## 2023-03-04 DIAGNOSIS — T451X5A Adverse effect of antineoplastic and immunosuppressive drugs, initial encounter: Secondary | ICD-10-CM | POA: Diagnosis not present

## 2023-03-04 DIAGNOSIS — D701 Agranulocytosis secondary to cancer chemotherapy: Secondary | ICD-10-CM | POA: Insufficient documentation

## 2023-03-04 DIAGNOSIS — D702 Other drug-induced agranulocytosis: Secondary | ICD-10-CM

## 2023-03-04 DIAGNOSIS — C9201 Acute myeloblastic leukemia, in remission: Secondary | ICD-10-CM

## 2023-03-04 DIAGNOSIS — Z87891 Personal history of nicotine dependence: Secondary | ICD-10-CM | POA: Diagnosis not present

## 2023-03-04 LAB — COMPREHENSIVE METABOLIC PANEL
ALT: 38 U/L (ref 0–44)
AST: 25 U/L (ref 15–41)
Albumin: 4.4 g/dL (ref 3.5–5.0)
Alkaline Phosphatase: 85 U/L (ref 38–126)
Anion gap: 6 (ref 5–15)
BUN: 16 mg/dL (ref 6–20)
CO2: 27 mmol/L (ref 22–32)
Calcium: 8.9 mg/dL (ref 8.9–10.3)
Chloride: 102 mmol/L (ref 98–111)
Creatinine, Ser: 1.07 mg/dL (ref 0.61–1.24)
GFR, Estimated: >60 mL/min (ref >60)
Glucose, Bld: 95 mg/dL (ref 70–99)
Potassium: 4 mmol/L (ref 3.5–5.1)
Sodium: 135 mmol/L (ref 135–145)
Total Bilirubin: 0.9 mg/dL (ref 0.3–1.2)
Total Protein: 7.9 g/dL (ref 6.5–8.1)

## 2023-03-04 LAB — CBC WITH DIFFERENTIAL/PLATELET
Abs Immature Granulocytes: 0.01 10*3/uL (ref 0.00–0.07)
Basophils Absolute: 0 10*3/uL (ref 0.0–0.1)
Basophils Relative: 1 %
Eosinophils Absolute: 0 10*3/uL (ref 0.0–0.5)
Eosinophils Relative: 1 %
HCT: 43.1 % (ref 39.0–52.0)
Hemoglobin: 14.5 g/dL (ref 13.0–17.0)
Immature Granulocytes: 1 %
Lymphocytes Relative: 32 %
Lymphs Abs: 0.7 10*3/uL (ref 0.7–4.0)
MCH: 32.3 pg (ref 26.0–34.0)
MCHC: 33.6 g/dL (ref 30.0–36.0)
MCV: 96 fL (ref 80.0–100.0)
Monocytes Absolute: 0.1 10*3/uL (ref 0.1–1.0)
Monocytes Relative: 3 %
Neutro Abs: 1.3 10*3/uL — ABNORMAL LOW (ref 1.7–7.7)
Neutrophils Relative %: 62 %
Platelets: 184 10*3/uL (ref 150–400)
RBC: 4.49 MIL/uL (ref 4.22–5.81)
RDW: 13.7 % (ref 11.5–15.5)
WBC: 2.1 10*3/uL — ABNORMAL LOW (ref 4.0–10.5)
nRBC: 0 % (ref 0.0–0.2)

## 2023-03-04 NOTE — Progress Notes (Signed)
C/o increase of heart rate at times, will wake him up if sleeping.

## 2023-03-04 NOTE — Progress Notes (Signed)
Hematology/Oncology Consult note [facility]  [phone] Fax:([phone]  Patient Care Team: Kara Dies, NP as PCP - General (Nurse Practitioner) Creig Hines, MD as Consulting Physician (Oncology)   Name of the patient: Carl [name]  [id number]  1976/01/27   Date of visit: 03/04/23  Diagnosis- history of AML in remission   Chief complaint/ Reason for visit-routine follow-up of AML in remission on oral Vidaza  Heme/Onc history: Carl [name]. is a 47 y.o. 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 Gene Coding Predicted Protein Variant allele fraction  U2AF1 c.101C>T p.(Ser34Phe) 12.9 %  RUNX1 c.620_621insATCCCCCG p.(Gln208Serfs*6) 6.7 %  NRAS c.38G>A p.(Gly13Asp) 9.2 %   Variants of Unknown Clinical Significance:  Gene Coding Predicted Protein Variant allele fraction  TET2 c.4493G>A p.(Arg1498His) 48.2 %  ETV6 c.776G>A p.(Arg259Gln) 49.1 %   Pertinent Phenotypic data: blasts express CD7, CD13, CD34, CD38, CD71, CD117, and HLA-DR.   He received cytarabine and daunorubicin (7+3) beginning 04/13/2020 and 05/27/2020.  Course has been complicated by fever and neutropenia with mucositis (04/27/2020) and gluteal cleft cellulitis (05/06/2020).Received induction with 7+3+HD with delayed count recovery. D42 BMBx revealed persistent disease and patient re-induced with 7+3.  He then went on to receive consolidation therapy with HiDAC and cycle 4 received on 11/04/2020.  He follows up with Dr. Malen Gauze at [facility]. Last bone marrow biopsy on 12/05/2020 which showed hypercellular bone marrow with trilineage hematopoiesis and 3% blasts by manual differential.   He is presently on azacitidine p.o. 300 mg daily 2 weeks on 2 weeks off.    Interval history-patient reports doing well overall.  He has occasional nausea associated with Vidaza for which she takes as needed Zofran.  Denies any testicular pain today  ECOG PS- 1 Pain  scale- 0   Review of systems- Review of Systems  Constitutional:  Positive for malaise/fatigue. Negative for chills, fever and weight loss.  HENT:  Negative for congestion, ear discharge and nosebleeds.   Eyes:  Negative for blurred vision.  Respiratory:  Negative for cough, hemoptysis, sputum production, shortness of breath and wheezing.   Cardiovascular:  Negative for chest pain, palpitations, orthopnea and claudication.  Gastrointestinal:  Negative for abdominal pain, blood in stool, constipation, diarrhea, heartburn, melena, nausea and vomiting.  Genitourinary:  Negative for dysuria, flank pain, frequency, hematuria and urgency.  Musculoskeletal:  Negative for back pain, joint pain and myalgias.  Skin:  Negative for rash.  Neurological:  Negative for dizziness, tingling, focal weakness, seizures, weakness and headaches.  Endo/Heme/Allergies:  Does not bruise/bleed easily.  Psychiatric/Behavioral:  Negative for depression and suicidal ideas. The patient does not have insomnia.       Allergies  Allergen Reactions   Dapsone     Contraindication - G6PD deficient   Primaquine     Contraindication - G6PD deficient   Rasburicase     Contraindication - G6PD deficient     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HCC)    Agranulocytosis secondary to cancer chemotherapy (CODE) (HCC)    Chemotherapy induced neutropenia (HCC)    Pancreatitis    Pancytopenia (HCC)    Thrombocytopenia (HCC)    Tobacco use disorder      Past Surgical History:  Procedure Laterality Date   BONE MARROW BIOPSY     ESOPHAGOGASTRODUODENOSCOPY (EGD) WITH PROPOFOL N/A 07/24/2022   Procedure: ESOPHAGOGASTRODUODENOSCOPY (EGD) WITH PROPOFOL;  Surgeon: Midge Minium, MD;  Location: [facility] ENDOSCOPY;  Service: Endoscopy;  Laterality: N/A;    Social History   Socioeconomic History   Marital status: Single    Spouse name: Not on file   Number of children: Not on file    Years of education: Not on file   Highest education level: Not on file  Occupational History   Not on file  Tobacco Use   Smoking status: Former    Packs/day: 1.00    Years: 17.00    Additional pack years: 0.00    Total pack years: 17.00    Types: Cigarettes    Quit date: 03/27/2020    Years since quitting: 2.9   Smokeless tobacco: Never  Vaping Use   Vaping Use: Never used  Substance and Sexual Activity   Alcohol use: Yes    Alcohol/week: 7.0 standard drinks of alcohol    Types: 7 Cans of beer per week    Comment: Socially   Drug use: Not Currently   Sexual activity: Yes  Other Topics Concern   Not on file  Social History Narrative   Not on file   Social Determinants of Health   Financial Resource Strain: Not on file  Food Insecurity: Not on file  Transportation Needs: Unmet Transportation Needs (01/08/2023)   PRAPARE - Administrator, Civil Service (Medical): Yes    Lack of Transportation (Non-Medical): Yes  Physical Activity: Not on file  Stress: Not on file  Social Connections: Not on file  Intimate Partner Violence: Not on file    Family History  Problem Relation Age of Onset   Hypertension Mother    Diabetes Mother    Diabetes Father    Prostate cancer Neg Hx    Kidney cancer Neg Hx    Bladder Cancer Neg Hx      Current Outpatient Medications:    azaCITIDine (ONUREG) 300 MG tablet, TAKE 1 TABLET BY MOUTH ONCE DAILY ON DAYS 1-14 THEN OFF FOR THE NEXT 14 DAYS. REPEAT EVERY 28 DAYS., Disp: 14 tablet, Rfl: 5   celecoxib (CELEBREX) 100 MG capsule, Take 1 capsule (100 mg total) by mouth 2 (two) times daily., Disp: 20 capsule, Rfl: 0   glipiZIDE (GLUCOTROL) 5 MG tablet, TAKE 1 TABLET (5 MG TOTAL) BY MOUTH DAILY., Disp: 90 tablet, Rfl: 1   miconazole (MICOTIN) 2 % cream, Apply 1 Application topically 2 (two) times daily., Disp: 28.35 g, Rfl: 0   sildenafil (REVATIO) 20 MG tablet, Take 1-5 tablets by mouth 1 hour prior to intercourse, Disp: 30 tablet,  Rfl: 11   valACYclovir (VALTREX) 500 MG tablet, TAKE 1 TABLET (500 MG TOTAL) BY MOUTH DAILY., Disp: 90 tablet, Rfl: 1   omeprazole (PRILOSEC) 20 MG capsule, TAKE 1 CAPSULE BY MOUTH EVERY DAY (Patient not taking: Reported on 11/30/2022), Disp: 90 capsule, Rfl: 2  Physical exam:  Vitals:   03/04/23 0931  BP: 116/68  Pulse: 63  Temp: (!) 96.3 F (35.7 C)  TempSrc: Tympanic  SpO2: 100%  Weight: 207 lb 9.6 oz (94.2 kg)  Height: 5\' 9"  (1.753 m)   Physical Exam Cardiovascular:     Rate and Rhythm: Normal rate and regular rhythm.     Heart sounds: Normal heart sounds.  Pulmonary:     Effort: Pulmonary effort is normal.     Breath sounds: Normal breath sounds.  Abdominal:     General: Bowel sounds are normal.     Palpations: Abdomen is soft.  Skin:    General: Skin is warm and dry.  Neurological:  Mental Status: He is alert and oriented to person, place, and time.         Latest Ref Rng & Units 03/04/2023    9:22 AM  CMP  Glucose 70 - 99 mg/dL 95   BUN 6 - 20 mg/dL 16   Creatinine 7.42 - 1.24 mg/dL 5.95   Sodium 638 - 756 mmol/L 135   Potassium 3.5 - 5.1 mmol/L 4.0   Chloride 98 - 111 mmol/L 102   CO2 22 - 32 mmol/L 27   Calcium 8.9 - 10.3 mg/dL 8.9   Total Protein 6.5 - 8.1 g/dL 7.9   Total Bilirubin 0.3 - 1.2 mg/dL 0.9   Alkaline Phos 38 - 126 U/L 85   AST 15 - 41 U/L 25   ALT 0 - 44 U/L 38       Latest Ref Rng & Units 03/04/2023    9:22 AM  CBC  WBC 4.0 - 10.5 K/uL 2.1   Hemoglobin 13.0 - 17.0 g/dL 43.3   Hematocrit 29.5 - 52.0 % 43.1   Platelets 150 - 400 K/uL 184      Assessment and plan- Patient is a 47 y.o. male with history of AML on oral Vidaza here for routine follow-up  Patient has chronic leukopenia/neutropenia with an ANC that fluctuates between 1-1.5.  No evidence of anemia or thrombocytopenia that would be concerning for recurrence of his AML.  There is no clear downward trend in his neutrophil count either.  He is tolerating oral Vidaza well other  than occasional nausea well-controlled with Zofran.  CBC with differential in 6 weeks and 12 weeks and I will see him back in 12 weeks   Visit Diagnosis 1. AML (acute myeloid leukemia) in remission (HCC)   2. Drug-induced neutropenia (HCC)   3. High risk medication use      Dr. Owens Shark, MD, MPH [facility] [phone] 03/04/2023 2:32 PM

## 2023-03-05 ENCOUNTER — Telehealth: Payer: Self-pay | Admitting: *Deleted

## 2023-03-05 NOTE — Telephone Encounter (Signed)
Pt was seen yest. And says that Leonie Douglas has been needing forms and labs, and md visits. I sent it by fax today

## 2023-03-13 ENCOUNTER — Telehealth: Payer: Self-pay | Admitting: *Deleted

## 2023-03-13 NOTE — Telephone Encounter (Signed)
Fax transmission went through

## 2023-03-29 ENCOUNTER — Ambulatory Visit: Payer: Medicare Other | Admitting: Nurse Practitioner

## 2023-04-14 ENCOUNTER — Other Ambulatory Visit: Payer: Self-pay | Admitting: Nurse Practitioner

## 2023-04-14 ENCOUNTER — Other Ambulatory Visit: Payer: Self-pay | Admitting: Oncology

## 2023-04-15 ENCOUNTER — Inpatient Hospital Stay: Payer: Medicare Other

## 2023-04-15 ENCOUNTER — Encounter: Payer: Self-pay | Admitting: Hematology and Oncology

## 2023-04-15 ENCOUNTER — Inpatient Hospital Stay: Payer: Medicare Other | Attending: Oncology

## 2023-04-15 DIAGNOSIS — Z87891 Personal history of nicotine dependence: Secondary | ICD-10-CM | POA: Insufficient documentation

## 2023-04-15 DIAGNOSIS — C9201 Acute myeloblastic leukemia, in remission: Secondary | ICD-10-CM | POA: Insufficient documentation

## 2023-04-15 DIAGNOSIS — Z79899 Other long term (current) drug therapy: Secondary | ICD-10-CM | POA: Insufficient documentation

## 2023-04-15 DIAGNOSIS — R519 Headache, unspecified: Secondary | ICD-10-CM | POA: Diagnosis not present

## 2023-04-15 LAB — CBC WITH DIFFERENTIAL/PLATELET
Abs Immature Granulocytes: 0 10*3/uL (ref 0.00–0.07)
Basophils Absolute: 0 10*3/uL (ref 0.0–0.1)
Basophils Relative: 0 %
Eosinophils Absolute: 0 10*3/uL (ref 0.0–0.5)
Eosinophils Relative: 1 %
HCT: 32.9 % — ABNORMAL LOW (ref 39.0–52.0)
Hemoglobin: 11 g/dL — ABNORMAL LOW (ref 13.0–17.0)
Immature Granulocytes: 0 %
Lymphocytes Relative: 33 %
Lymphs Abs: 0.5 10*3/uL — ABNORMAL LOW (ref 0.7–4.0)
MCH: 33.3 pg (ref 26.0–34.0)
MCHC: 33.4 g/dL (ref 30.0–36.0)
MCV: 99.7 fL (ref 80.0–100.0)
Monocytes Absolute: 0 10*3/uL — ABNORMAL LOW (ref 0.1–1.0)
Monocytes Relative: 2 %
Neutro Abs: 0.9 10*3/uL — ABNORMAL LOW (ref 1.7–7.7)
Neutrophils Relative %: 64 %
Platelets: 135 10*3/uL — ABNORMAL LOW (ref 150–400)
RBC: 3.3 MIL/uL — ABNORMAL LOW (ref 4.22–5.81)
RDW: 14.5 % (ref 11.5–15.5)
WBC: 1.4 10*3/uL — ABNORMAL LOW (ref 4.0–10.5)
nRBC: 0 % (ref 0.0–0.2)

## 2023-04-15 LAB — COMPREHENSIVE METABOLIC PANEL
ALT: 24 U/L (ref 0–44)
AST: 17 U/L (ref 15–41)
Albumin: 3.7 g/dL (ref 3.5–5.0)
Alkaline Phosphatase: 79 U/L (ref 38–126)
Anion gap: 6 (ref 5–15)
BUN: 13 mg/dL (ref 6–20)
CO2: 24 mmol/L (ref 22–32)
Calcium: 8.4 mg/dL — ABNORMAL LOW (ref 8.9–10.3)
Chloride: 101 mmol/L (ref 98–111)
Creatinine, Ser: 1.05 mg/dL (ref 0.61–1.24)
GFR, Estimated: >60 mL/min (ref >60)
Glucose, Bld: 120 mg/dL — ABNORMAL HIGH (ref 70–99)
Potassium: 3.5 mmol/L (ref 3.5–5.1)
Sodium: 131 mmol/L — ABNORMAL LOW (ref 135–145)
Total Bilirubin: 1.3 mg/dL — ABNORMAL HIGH (ref 0.3–1.2)
Total Protein: 7.1 g/dL (ref 6.5–8.1)

## 2023-04-17 ENCOUNTER — Telehealth: Payer: Self-pay | Admitting: *Deleted

## 2023-04-17 NOTE — Telephone Encounter (Signed)
Apts sch with sarah for Friday 8/23. Per Aundra Millet- patient is aware of apts

## 2023-04-17 NOTE — Telephone Encounter (Signed)
Patient called reporting that he has been having increasing headaches, sweating and light headedness for the past 4 days. He states that he has been taking BC's for it and then lies down and will feel a little better for a while. He denies fever or chills but states that he has not checked his temp as he lost his thermometer.Marland Kitchen He states that he has has all of this "for a while", but it has gotten worse. He is just have labs drawn Monday. His next doctor appointment is October . 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   0 Result Notes          Component Ref Range & Units 2 d ago (04/15/23) 1 mo ago (03/04/23) 2 mo ago (01/22/23) 3 mo ago (12/21/22) 4 mo ago (11/19/22) 5 mo ago (11/05/22) 5 mo ago (11/02/22)  WBC 4.0 - 10.5 K/uL 1.4 Low  2.1 Low  2.1 Low  2.5 Low  2.6 Low  3.5 Low  3.5 Low   RBC 4.22 - 5.81 MIL/uL 3.30 Low  4.49 4.42 4.61 4.57 4.48 4.61  Hemoglobin 13.0 - 17.0 g/dL 40.9 Low  81.1 91.4 78.2 13.9 13.7 14.4  HCT 39.0 - 52.0 % 32.9 Low  43.1 41.2 43.5 42.6 40.5 43.0  MCV 80.0 - 100.0 fL 99.7 96.0 93.2 94.4 93.2 90.4 93.3  MCH 26.0 - 34.0 pg 33.3 32.3 32.8 30.8 30.4 30.6 31.2  MCHC 30.0 - 36.0 g/dL 95.6 21.3 08.6 57.8 46.9 33.8 33.5  RDW 11.5 - 15.5 % 14.5 13.7 13.4 13.7 12.8 13.4 13.7  Platelets 150 - 400 K/uL 135 Low  184 181 175 199 168 148 Low   nRBC 0.0 - 0.2 % 0.0 0.0 0.0 0.0 0.0 0.0 0.0  Neutrophils Relative % % 64 62 55 50 49 52 62  Neutro Abs 1.7 - 7.7 K/uL 0.9 Low  1.3 Low  1.1 Low  1.3 Low  1.3 Low  1.8 2.2  Lymphocytes Relative % 33 32 38 39 40 36 26  Lymphs Abs 0.7 - 4.0 K/uL 0.5 Low  0.7 0.8 1.0 1.0 1.3 0.9  Monocytes Relative % 2 3 5 8 7 7 9   Monocytes Absolute 0.1 - 1.0 K/uL 0.0 Low  0.1 0.1 0.2 0.2 0.2 0.3  Eosinophils Relative % 1 1 1 2 2 3 2    Eosinophils Absolute 0.0 - 0.5 K/uL 0.0 0.0 0.0 0.0 0.0 0.1 0.1  Basophils Relative % 0 1 1 1 2 1 1   Basophils Absolute 0.0 - 0.1 K/uL 0.0 0.0 0.0 0.0 0.1 0.0 0.0  Immature Granulocytes % 0 1 0 0 0 1 0  Abs Immature Granulocytes 0.00 - 0.07 K/uL 0.00 0.01 CM 0.00 CM 0.01 CM 0.00 CM 0.02 CM 0.00 CM  Comment: Performed at [facility], [address] [place], [place] [address]  Resulting Agency [facility] CLIN LAB CH CLIN LAB CH CLIN LAB CH CLIN LAB CH CLIN LAB CH CLIN LAB CH CLIN LAB         Specimen Collected: 04/15/23 09:59 Last Resulted: 04/15/23 10:16      Lab Flowsheet      Order Details      View Encounter      Lab and Collection Details  Routing      Result History    View All Conversations on this Encounter      CM=Additional comments      Result Care Coordination   Patient Communication   Add Comments   Add Notifications  Back to Top    Other Results from 04/15/2023   Contains abnormal data Comprehensive metabolic panel Order: [id number] Status: Final result      Visible to patient: Yes (not seen)      Next appt: 05/28/2023 at 09:00 AM in Oncology ([facility])      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    0 Result Notes            Component Ref Range & Units 2 d ago (04/15/23) 1 mo ago (03/04/23) 2 mo ago (01/22/23) 3 mo ago (12/21/22) 4 mo ago (11/19/22) 5 mo ago (11/05/22) 5 mo ago (11/02/22)  Sodium 135 - 145 mmol/L 131 Low  135 137 CM 135 135 136 137  Potassium 3.5 - 5.1 mmol/L 3.5 4.0 3.9 CM 4.3 3.7 3.2 Low  3.5  Chloride 98 - 111 mmol/L 101 102 105 CM 102 103 105 105  CO2 22 - 32 mmol/L 24 27 18  Low  CM 25 26 23 26   Glucose, Bld 70 - 99 mg/dL 409 High  95 CM 811 High  CM 114 High  CM 69 Low  CM 94 CM 62 Low  CM  Comment: Glucose reference range applies only to samples taken after fasting for at least 8 hours.  BUN 6 - 20 mg/dL 13 16 19 16 13 11 12   Creatinine, Ser 0.61 - 1.24 mg/dL 9.14 7.82 9.56 2.13 0.86 1.02 1.16   Calcium 8.9 - 10.3 mg/dL 8.4 Low  8.9 8.6 Low  CM 9.0 8.9 8.9 8.6 Low   Total Protein 6.5 - 8.1 g/dL 7.1 7.9 6.8 7.1 7.3 7.3 7.2  Albumin 3.5 - 5.0 g/dL 3.7 4.4 4.2 4.0 4.2 4.1 4.2  AST 15 - 41 U/L 17 25 46 High  24 30 29 27   ALT 0 - 44 U/L 24 38 53 High  CM 34 56 High  45 High  46 High   Alkaline Phosphatase 38 - 126 U/L 79 85 102 75 65 70 69  Total Bilirubin 0.3 - 1.2 mg/dL 1.3 High  0.9 0.8 0.9 1.0 0.9 1.0  GFR, Estimated >60 mL/min >60 >60 CM >60 CM >60 CM >60 CM  >60 CM  Comment: (NOTE) Calculated using the CKD-EPI Creatinine Equation (2021)  Anion gap 5 - 15 6 6  CM 14 CM 8 CM 6 CM 8 CM 6 CM  Comment: Performed at [facility], [address] [place], [place] [address]  Resulting Agency [facility] CLIN LAB CH CLIN LAB CH CLIN LAB CH CLIN LAB CH CLIN LAB CH CLIN LAB CH CLIN LAB         Specimen Collected: 04/15/23 09:59 Last Resulted: 04/15/23 10:30

## 2023-04-17 NOTE — Telephone Encounter (Signed)
Lauren, NP does not have any openings in [facility] tomorrow, but Maralyn Sago can see on Friday 8/23

## 2023-04-17 NOTE — Telephone Encounter (Signed)
Per Dr. Smith Carl, patient previously was referred to neuro- but unclear if patient went to this apt.

## 2023-04-19 ENCOUNTER — Inpatient Hospital Stay: Payer: Medicare Other

## 2023-04-19 ENCOUNTER — Encounter: Payer: Self-pay | Admitting: Medical Oncology

## 2023-04-19 ENCOUNTER — Inpatient Hospital Stay (HOSPITAL_BASED_OUTPATIENT_CLINIC_OR_DEPARTMENT_OTHER): Payer: Medicare Other | Admitting: Medical Oncology

## 2023-04-19 VITALS — BP 130/70 | HR 84 | Temp 96.5°F | Ht 69.0 in | Wt 205.0 lb

## 2023-04-19 DIAGNOSIS — D702 Other drug-induced agranulocytosis: Secondary | ICD-10-CM | POA: Diagnosis not present

## 2023-04-19 DIAGNOSIS — C9201 Acute myeloblastic leukemia, in remission: Secondary | ICD-10-CM | POA: Diagnosis not present

## 2023-04-19 DIAGNOSIS — R519 Headache, unspecified: Secondary | ICD-10-CM | POA: Diagnosis not present

## 2023-04-19 LAB — CMP (CANCER CENTER ONLY)
ALT: 30 U/L (ref 0–44)
AST: 20 U/L (ref 15–41)
Albumin: 3.8 g/dL (ref 3.5–5.0)
Alkaline Phosphatase: 95 U/L (ref 38–126)
Anion gap: 6 (ref 5–15)
BUN: 12 mg/dL (ref 6–20)
CO2: 25 mmol/L (ref 22–32)
Calcium: 8.7 mg/dL — ABNORMAL LOW (ref 8.9–10.3)
Chloride: 105 mmol/L (ref 98–111)
Creatinine: 0.96 mg/dL (ref 0.61–1.24)
GFR, Estimated: >60 mL/min (ref >60)
Glucose, Bld: 140 mg/dL — ABNORMAL HIGH (ref 70–99)
Potassium: 4.3 mmol/L (ref 3.5–5.1)
Sodium: 136 mmol/L (ref 135–145)
Total Bilirubin: 0.6 mg/dL (ref 0.3–1.2)
Total Protein: 7.3 g/dL (ref 6.5–8.1)

## 2023-04-19 LAB — CBC WITH DIFFERENTIAL (CANCER CENTER ONLY)
Abs Immature Granulocytes: 0.01 10*3/uL (ref 0.00–0.07)
Basophils Absolute: 0 10*3/uL (ref 0.0–0.1)
Basophils Relative: 1 %
Eosinophils Absolute: 0 10*3/uL (ref 0.0–0.5)
Eosinophils Relative: 1 %
HCT: 33.7 % — ABNORMAL LOW (ref 39.0–52.0)
Hemoglobin: 11.3 g/dL — ABNORMAL LOW (ref 13.0–17.0)
Immature Granulocytes: 1 %
Lymphocytes Relative: 54 %
Lymphs Abs: 0.6 10*3/uL — ABNORMAL LOW (ref 0.7–4.0)
MCH: 33.6 pg (ref 26.0–34.0)
MCHC: 33.5 g/dL (ref 30.0–36.0)
MCV: 100.3 fL — ABNORMAL HIGH (ref 80.0–100.0)
Monocytes Absolute: 0 10*3/uL — ABNORMAL LOW (ref 0.1–1.0)
Monocytes Relative: 3 %
Neutro Abs: 0.4 10*3/uL — CL (ref 1.7–7.7)
Neutrophils Relative %: 40 %
Platelet Count: 163 10*3/uL (ref 150–400)
RBC: 3.36 MIL/uL — ABNORMAL LOW (ref 4.22–5.81)
RDW: 14.4 % (ref 11.5–15.5)
WBC Count: 1 10*3/uL — ABNORMAL LOW (ref 4.0–10.5)
nRBC: 0 % (ref 0.0–0.2)

## 2023-04-19 MED ORDER — SUMATRIPTAN SUCCINATE 25 MG PO TABS: 25.0000 mg | ORAL_TABLET | ORAL | 0 refills | Status: AC | PRN

## 2023-04-19 NOTE — Progress Notes (Signed)
Symptom Management Clinic [facility] at [facility] [phone] [phone]  Patient Care Team: Kara Dies, NP as PCP - General (Nurse Practitioner) Creig Hines, MD as Consulting Physician (Oncology)   Name of the patient: Carl [name]  [id number]  1976-02-20   Oncological History: history of AML in remission   Current Treatment: Oral Vidaza   Date of visit: 04/19/23  Reason for Consult: Carl [name]. is a 47 y.o. male who presents today for:  Headaches: He reports that he has been having headaches off and on for a "while". Reported as weeks to months. The headaches occur on the left side of the head. Described as sharp in nature. No vomiting head trauma, neck stiffness, fever, hearing changes, neuro changes. He has been taking BC powders for this with some improvement. He reports some racing heart sensation at times with his headaches but does not think it is related to his BC powder use.    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    ALLERGIES:  is allergic to dapsone, primaquine, and rasburicase.  MEDICATIONS:  Current Outpatient Medications  Medication Sig Dispense Refill   azaCITIDine (ONUREG) 300 MG tablet TAKE 1 TABLET BY MOUTH ONCE DAILY ON DAYS 1-14 THEN OFF  FOR THE NEXT 14 DAYS. REPEAT EVERY 28 DAYS. 14 tablet 5   celecoxib (CELEBREX) 100 MG capsule Take 1 capsule (100 mg total) by mouth 2 (two) times daily. 20 capsule 0   glipiZIDE (GLUCOTROL) 5 MG tablet TAKE 1 TABLET (5 MG TOTAL) BY MOUTH DAILY. 90 tablet 1   miconazole (MICOTIN) 2 % cream Apply 1 Application topically 2 (two) times daily. 28.35 g 0   ondansetron (ZOFRAN) 4 MG tablet TAKE 1 TABLET BY MOUTH EVERY 8 HOURS AS NEEDED FOR NAUSEA AND VOMITING 60 tablet 0   sildenafil (REVATIO) 20 MG tablet Take 1-5 tablets by mouth 1 hour prior to intercourse 30 tablet 11   SUMAtriptan (IMITREX) 25 MG tablet Take 1 tablet (25 mg total) by mouth as needed for migraine. May repeat in 2 hours if headache persists or recurs. No more than 2 tablets in 24 hours 10 tablet 0   valACYclovir (VALTREX) 500 MG tablet TAKE 1 TABLET (500 MG TOTAL) BY MOUTH DAILY. 90 tablet 1   omeprazole (PRILOSEC) 20 MG capsule TAKE 1 CAPSULE BY MOUTH EVERY DAY (Patient not taking: Reported on 11/30/2022) 90 capsule 2   No current facility-administered medications for this visit.    VITAL SIGNS: BP 130/70 (BP Location: Left Arm, Patient Position: Sitting)   Pulse 84   Temp (!) 96.5 F (35.8 C) (Tympanic)   Ht 5\' 9"  (1.753 m)   Wt 205 lb (93 kg)   SpO2 100%   BMI 30.27 kg/m  Filed Weights   04/19/23 1111  Weight: 205 lb (93 kg)  Estimated body mass index is 30.27 kg/m as calculated from the following:   Height as of this encounter: 5\' 9"  (1.753 m).   Weight as of this encounter: 205 lb (93 kg).  LABS: CBC:    Component Value Date/Time   WBC 1.0 (L) 04/19/2023 1138   WBC 1.4 (L) 04/15/2023 0959   HGB 11.3 (L) 04/19/2023 1138   HCT 33.7 (L) 04/19/2023 1138   HCT 44.2 03/29/2020 0826   PLT 163 04/19/2023 1138   MCV 100.3 (H) 04/19/2023 1138   NEUTROABS 0.4 (LL) 04/19/2023 1138   LYMPHSABS 0.6 (L) 04/19/2023 1138   MONOABS 0.0 (L) 04/19/2023 1138   EOSABS 0.0 04/19/2023 1138   BASOSABS 0.0 04/19/2023 1138    Comprehensive Metabolic Panel:    Component Value Date/Time   NA 136 04/19/2023 1138   K 4.3 04/19/2023 1138   CL 105 04/19/2023 1138   CO2 25 04/19/2023 1138   BUN 12 04/19/2023 1138   CREATININE 0.96 04/19/2023 1138   CREATININE 0.94 12/10/2019 0859   GLUCOSE 140 (H) 04/19/2023 1138   CALCIUM 8.7 (L) 04/19/2023 1138   AST 20 04/19/2023 1138   ALT 30 04/19/2023 1138   ALKPHOS 95 04/19/2023 1138   BILITOT 0.6 04/19/2023 1138   PROT 7.3 04/19/2023 1138   ALBUMIN 3.8 04/19/2023 1138   PERFORMANCE STATUS (ECOG) : 2 - Symptomatic, <50% confined to bed  Review of Systems Unless otherwise noted, a complete review of systems is negative.  Physical Exam General: NAD HEENT: PEARLA. EOM intact without nystagmus. TMS normal bilaterally. Neck is supple and non-tender Cardiovascular: regular rate and rhythm Pulmonary: clear ant fields Extremities: no edema, no joint deformities Skin: no rashes Neurological: Gait is normal. Speech is normal. Cranial nerves appear intact. Extremity strength 5/5 bilaterally. Normal Romberg, Normal Finger to nose. 2+ Patellar DTRs.   Assessment and Plan- Patient is a 46 y.o. male  with a history of AML on oral Vidaza:   AML/Drug induced Neutropenia: Patient has chronic leukopenia/neutropenia with an ANC that fluctuates between 1-1.5 secondary to her Vidaza. Today ANC has dropped to 0.4. Discussed with Dr. Smith Robert who agrees with holding Vidaza x 2 weeks. Discussed neutropenic precautions.   Headaches: Acute on chronic. Worsening. Neuro exam is reassuring today. Discussed ways to help prevent migraines: good quality sleep, hydration with water, protein rich diet. Caffeine precautions. TSH pending. Will refer to neuro onc for further evaluation and to discuss if further imaging is warranted.   Encounter Diagnoses  Name Primary?   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 He also understands that He can call clinic at any time with any questions, concerns, or complaints.   Thank you for allowing me to participate in the care of this very pleasant patient.    Time Total: 25  Visit consisted of counseling and education dealing with the complex and emotionally intense issues of symptom management in the setting of serious illness.Greater than 50%  of this time was spent counseling and coordinating care related to the above assessment and plan.  Signed by: Clent Jacks, PA-C

## 2023-04-20 LAB — THYROID PANEL WITH TSH
Free Thyroxine Index: 1.8 (ref 1.2–4.9)
T3 Uptake Ratio: 28 % (ref 24–39)
T4, Total: 6.3 ug/dL (ref 4.5–12.0)
TSH: 0.63 u[IU]/mL (ref 0.450–4.500)

## 2023-05-03 ENCOUNTER — Inpatient Hospital Stay (HOSPITAL_BASED_OUTPATIENT_CLINIC_OR_DEPARTMENT_OTHER): Payer: Medicare Other | Admitting: Internal Medicine

## 2023-05-03 ENCOUNTER — Inpatient Hospital Stay: Payer: Medicare Other | Attending: Oncology

## 2023-05-03 ENCOUNTER — Encounter: Payer: Self-pay | Admitting: Internal Medicine

## 2023-05-03 VITALS — BP 112/69 | HR 67 | Temp 98.0°F | Resp 12 | Wt 211.0 lb

## 2023-05-03 DIAGNOSIS — D708 Other neutropenia: Secondary | ICD-10-CM | POA: Diagnosis not present

## 2023-05-03 DIAGNOSIS — C9201 Acute myeloblastic leukemia, in remission: Secondary | ICD-10-CM | POA: Diagnosis present

## 2023-05-03 DIAGNOSIS — Z79899 Other long term (current) drug therapy: Secondary | ICD-10-CM | POA: Insufficient documentation

## 2023-05-03 DIAGNOSIS — G43109 Migraine with aura, not intractable, without status migrainosus: Secondary | ICD-10-CM | POA: Insufficient documentation

## 2023-05-03 MED ORDER — PROPRANOLOL HCL 20 MG PO TABS: 20.0000 mg | ORAL_TABLET | Freq: Two times a day (BID) | ORAL | 2 refills | Status: AC

## 2023-05-03 NOTE — Progress Notes (Signed)
[facility] at [facility] [address]  [place], [place] [address] [phone]   New Patient Evaluation  Date of Service: 05/03/23 Patient Name: Carl [name]. Patient MRN: [id number] Patient DOB: Feb 05, 1976 Provider: Henreitta Leber, MD  Identifying Statement:  Carl Tacorey Cotrone. is a 47 y.o. male with Migraine with aura and without status migrainosus, not intractable who presents for initial consultation and evaluation regarding cancer associated neurologic deficits.    Referring Provider: Rushie Chestnut, PA-C [address] [place],  [place] [address]  Primary Cancer: AML  Oncologic History: Oncology History   No history exists.    History of Present Illness: The patient's records from the referring physician were obtained and reviewed and the patient interviewed to confirm this HPI.  Carl [name]. Presents today to review headache symptoms.  He describes ~1 year history of left sided headaches, described as "pounding for several hours up to several days".  There is associated photophobia and phonophobia but no nausea/vomiting.  Does get a warning before headaches, described as a "funny feeling".  He has several days in between headaches.  Has been dosing BC powder with modest efficacy.  No other neurologic complaints.  No history of migraine in his family.  Continues on oral chemotherapy for AML with Dr. Smith Robert.    Medications: Current Outpatient Medications on File Prior to Visit  Medication Sig Dispense Refill   azaCITIDine (ONUREG) 300 MG tablet TAKE 1 TABLET BY MOUTH ONCE DAILY ON DAYS 1-14 THEN OFF FOR THE NEXT 14 DAYS. REPEAT EVERY 28 DAYS. 14 tablet 5   celecoxib (CELEBREX) 100 MG capsule Take 1 capsule (100 mg total) by mouth 2 (two) times daily. 20 capsule 0   glipiZIDE (GLUCOTROL) 5 MG tablet TAKE 1 TABLET (5 MG TOTAL) BY MOUTH DAILY. 90 tablet 1   miconazole (MICOTIN) 2 % cream Apply 1 Application topically 2 (two) times daily.  28.35 g 0   omeprazole (PRILOSEC) 20 MG capsule TAKE 1 CAPSULE BY MOUTH EVERY DAY (Patient not taking: Reported on 11/30/2022) 90 capsule 2   ondansetron (ZOFRAN) 4 MG tablet TAKE 1 TABLET BY MOUTH EVERY 8 HOURS AS NEEDED FOR NAUSEA AND VOMITING 60 tablet 0   sildenafil (REVATIO) 20 MG tablet Take 1-5 tablets by mouth 1 hour prior to intercourse 30 tablet 11   SUMAtriptan (IMITREX) 25 MG tablet Take 1 tablet (25 mg total) by mouth as needed for migraine. May repeat in 2 hours if headache persists or recurs. No more than 2 tablets in 24 hours 10 tablet 0   valACYclovir (VALTREX) 500 MG tablet TAKE 1 TABLET (500 MG TOTAL) BY MOUTH DAILY. 90 tablet 1   No current facility-administered medications on file prior to visit.    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)    Agranulocytosis secondary to cancer chemotherapy (CODE) (HCC)    Chemotherapy induced neutropenia (HCC)    Pancreatitis    Pancytopenia (HCC)    Thrombocytopenia (HCC)    Tobacco use disorder    Past Surgical History:  Past Surgical History:  Procedure Laterality Date   BONE MARROW BIOPSY     ESOPHAGOGASTRODUODENOSCOPY (EGD) WITH PROPOFOL N/A 07/24/2022  Procedure: ESOPHAGOGASTRODUODENOSCOPY (EGD) WITH PROPOFOL;  Surgeon: Midge Minium, MD;  Location: [facility] ENDOSCOPY;  Service: Endoscopy;  Laterality: N/A;   Social History:  Social History   Socioeconomic History   Marital status: Single    Spouse name: Not on file   Number of children: Not on file   Years of education: Not on file   Highest education level: Not on file  Occupational History   Not on file  Tobacco Use   Smoking status: Former    Current packs/day: 0.00    Average packs/day: 1 pack/day for  17.0 years (17.0 ttl pk-yrs)    Types: Cigarettes    Start date: 03/28/2003    Quit date: 03/27/2020    Years since quitting: 3.1   Smokeless tobacco: Never  Vaping Use   Vaping status: Never Used  Substance and Sexual Activity   Alcohol use: Yes    Alcohol/week: 7.0 standard drinks of alcohol    Types: 7 Cans of beer per week    Comment: Socially   Drug use: Not Currently   Sexual activity: Yes  Other Topics Concern   Not on file  Social History Narrative   Not on file   Social Determinants of Health   Financial Resource Strain: Medium Risk (08/26/2020)   Received from [facility], [facility]   Overall Financial Resource Strain (CARDIA)    Difficulty of Paying Living Expenses: Somewhat hard  Food Insecurity: No Food Insecurity (08/26/2020)   Received from [facility], [facility]   Hunger Vital Sign    Worried About Running Out of Food in the Last Year: Never true    Ran Out of Food in the Last Year: Never true  Transportation Needs: Unmet Transportation Needs (01/08/2023)   PRAPARE - Administrator, Civil Service (Medical): Yes    Lack of Transportation (Non-Medical): Yes  Physical Activity: Not on file  Stress: Not on file  Social Connections: Not on file  Intimate Partner Violence: Not on file   Family History:  Family History  Problem Relation Age of Onset   Hypertension Mother    Diabetes Mother    Diabetes Father    Prostate cancer Neg Hx    Kidney cancer Neg Hx    Bladder Cancer Neg Hx     Review of Systems: Constitutional: Doesn't report fevers, chills or abnormal weight loss Eyes: Doesn't report blurriness of vision Ears, nose, mouth, throat, and face: Doesn't report sore throat Respiratory: Doesn't report cough, dyspnea or wheezes Cardiovascular: Doesn't report palpitation, chest discomfort  Gastrointestinal:  Doesn't report nausea, constipation, diarrhea GU: Doesn't report incontinence Skin: Doesn't report skin  rashes Neurological: Per HPI Musculoskeletal: Doesn't report joint pain Behavioral/Psych: Doesn't report anxiety  Physical Exam: Vitals:   05/03/23 1007  BP: 112/69  Pulse: 67  Resp: 12  Temp: 98 F (36.7 C)   KPS: 90. General: Alert, cooperative, pleasant, in no acute distress Head: Normal EENT: No conjunctival injection or scleral icterus.  Lungs: Resp effort normal Cardiac: Regular rate Abdomen: Non-distended abdomen Skin: No rashes cyanosis or petechiae. Extremities: No clubbing or edema  Neurologic Exam: Mental Status: Awake, alert, attentive to examiner. Oriented to self and environment. Language is fluent with intact comprehension.  Cranial Nerves: Visual acuity is grossly normal. Visual fields are full. Extra-ocular movements intact. No ptosis. Face is symmetric Motor: Tone and bulk are normal. Power is full in both arms and legs. Reflexes are symmetric, no pathologic reflexes present.  Sensory: Intact to light touch Gait: Normal.   Labs: I have reviewed the data as listed    Component Value Date/Time   NA 136 04/19/2023 1138   K 4.3 04/19/2023 1138   CL 105 04/19/2023 1138   CO2 25 04/19/2023 1138   GLUCOSE 140 (H) 04/19/2023 1138   BUN 12 04/19/2023 1138   CREATININE 0.96 04/19/2023 1138   CREATININE 0.94 12/10/2019 0859   CALCIUM 8.7 (L) 04/19/2023 1138   PROT 7.3 04/19/2023 1138   ALBUMIN 3.8 04/19/2023 1138   AST 20 04/19/2023 1138   ALT 30 04/19/2023 1138   ALKPHOS 95 04/19/2023 1138   BILITOT 0.6 04/19/2023 1138   GFRNONAA >60 04/19/2023 1138   GFRNONAA 98 12/10/2019 0859   GFRAA >60 04/01/2020 0708   GFRAA 114 12/10/2019 0859   Lab Results  Component Value Date   WBC 1.0 (L) 04/19/2023   NEUTROABS 0.4 (LL) 04/19/2023   HGB 11.3 (L) 04/19/2023   HCT 33.7 (L) 04/19/2023   MCV 100.3 (H) 04/19/2023   PLT 163 04/19/2023    Imaging: CLINICAL DATA:  Blurred vision 2 weeks   EXAM: MRI HEAD WITHOUT AND WITH CONTRAST    TECHNIQUE: Multiplanar, multiecho pulse sequences of the brain and surrounding structures were obtained without and with intravenous contrast.   CONTRAST:  9mL GADAVIST GADOBUTROL 1 MMOL/ML IV SOLN   COMPARISON:  None Available.   FINDINGS: Brain: No acute infarction, hemorrhage, hydrocephalus, extra-axial collection or mass lesion.   Normal white matter.  Normal enhancement.   Vascular: Normal arterial flow voids.   Skull and upper cervical spine: No focal skeletal lesion.   Sinuses/Orbits: Mucosal edema paranasal sinuses.  Negative orbit   Other: None   IMPRESSION: Normal MRI brain without with contrast   Paranasal sinus mucosal edema.     Electronically Signed   By: Marlan Palau M.D.   On: 05/13/2022 11:48   Assessment/Plan Chronic daily headache  Carl Kelen Ryals. Presents with clinical syndrome consistent with migraine with aura.  Poor sleep hygiene may be a provoking factor.  Given frequency and severity of headaches, we discussed headache prevention medications; recommended trial of propanolol 20mg  BID.  He is agreeable with this, no apparent contraindication.  Also ok with PRN Imitrex 25-50mg  for acute migraines.  We instructed him to dose analgesia early, even during the aura if possible.   Counseled on sleep hygiene.    We spent twenty additional minutes teaching regarding the natural history, biology, and historical experience in the treatment of neurologic complications of cancer.   We appreciate the opportunity to participate in the care of Carl [name].Marland Kitchen  He should follow up in 2 months for further medication adjustments.  All questions were answered. The patient knows to call the clinic with any problems, questions or concerns. No barriers to learning were detected.  The total time spent in the encounter was 40 minutes and more than 50% was on counseling and review of test results   Henreitta Leber, MD Medical Director of  Neuro-Oncology [facility] at [facility] [place] 05/03/23 9:57 AM

## 2023-05-03 NOTE — Progress Notes (Signed)
Pt referred by Clent Jacks PA for headaches.  Pt reports improved and not as frequent since starting imitrex.

## 2023-05-07 ENCOUNTER — Inpatient Hospital Stay: Payer: Medicare Other

## 2023-05-07 ENCOUNTER — Inpatient Hospital Stay (HOSPITAL_BASED_OUTPATIENT_CLINIC_OR_DEPARTMENT_OTHER): Payer: Medicare Other | Admitting: Oncology

## 2023-05-07 ENCOUNTER — Encounter: Payer: Self-pay | Admitting: Oncology

## 2023-05-07 VITALS — BP 123/78 | HR 67 | Temp 98.6°F | Resp 18 | Ht 71.0 in | Wt 212.7 lb

## 2023-05-07 DIAGNOSIS — D708 Other neutropenia: Secondary | ICD-10-CM | POA: Diagnosis not present

## 2023-05-07 DIAGNOSIS — C9201 Acute myeloblastic leukemia, in remission: Secondary | ICD-10-CM | POA: Diagnosis not present

## 2023-05-07 LAB — COMPREHENSIVE METABOLIC PANEL
ALT: 30 U/L (ref 0–44)
AST: 18 U/L (ref 15–41)
Albumin: 4.1 g/dL (ref 3.5–5.0)
Alkaline Phosphatase: 98 U/L (ref 38–126)
Anion gap: 7 (ref 5–15)
BUN: 17 mg/dL (ref 6–20)
CO2: 25 mmol/L (ref 22–32)
Calcium: 9.3 mg/dL (ref 8.9–10.3)
Chloride: 102 mmol/L (ref 98–111)
Creatinine, Ser: 1.16 mg/dL (ref 0.61–1.24)
GFR, Estimated: >60 mL/min (ref >60)
Glucose, Bld: 132 mg/dL — ABNORMAL HIGH (ref 70–99)
Potassium: 4 mmol/L (ref 3.5–5.1)
Sodium: 134 mmol/L — ABNORMAL LOW (ref 135–145)
Total Bilirubin: 0.7 mg/dL (ref 0.3–1.2)
Total Protein: 7.3 g/dL (ref 6.5–8.1)

## 2023-05-07 LAB — CBC WITH DIFFERENTIAL/PLATELET
Abs Immature Granulocytes: 0 10*3/uL (ref 0.00–0.07)
Basophils Absolute: 0 10*3/uL (ref 0.0–0.1)
Basophils Relative: 0 %
Eosinophils Absolute: 0 10*3/uL (ref 0.0–0.5)
Eosinophils Relative: 2 %
HCT: 35.2 % — ABNORMAL LOW (ref 39.0–52.0)
Hemoglobin: 11.8 g/dL — ABNORMAL LOW (ref 13.0–17.0)
Immature Granulocytes: 0 %
Lymphocytes Relative: 75 %
Lymphs Abs: 0.8 10*3/uL (ref 0.7–4.0)
MCH: 34.2 pg — ABNORMAL HIGH (ref 26.0–34.0)
MCHC: 33.5 g/dL (ref 30.0–36.0)
MCV: 102 fL — ABNORMAL HIGH (ref 80.0–100.0)
Monocytes Absolute: 0 10*3/uL — ABNORMAL LOW (ref 0.1–1.0)
Monocytes Relative: 2 %
Neutro Abs: 0.2 10*3/uL — CL (ref 1.7–7.7)
Neutrophils Relative %: 21 %
Platelets: 124 10*3/uL — ABNORMAL LOW (ref 150–400)
RBC: 3.45 MIL/uL — ABNORMAL LOW (ref 4.22–5.81)
RDW: 15.8 % — ABNORMAL HIGH (ref 11.5–15.5)
WBC: 1.1 10*3/uL — ABNORMAL LOW (ref 4.0–10.5)
nRBC: 0 % (ref 0.0–0.2)

## 2023-05-07 NOTE — Progress Notes (Signed)
Hematology/Oncology Consult note [facility]  [phone] [phone]  Patient Care Team: Kara Dies, NP as PCP - General (Nurse Practitioner) Creig Hines, MD as Consulting Physician (Oncology)   Name of the patient: Carl [name]  [id number]  September 15, 1975   Date of visit: 05/07/23  Diagnosis- history of AML in remission   Chief complaint/ Reason for visit- routine f/u of AML on oral vidaza  Heme/Onc history: Carl [name]. is a 47 y.o. 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 Gene Coding Predicted Protein Variant allele fraction  U2AF1 c.101C>T p.(Ser34Phe) 12.9 %  RUNX1 c.620_621insATCCCCCG p.(Gln208Serfs*6) 6.7 %  NRAS c.38G>A p.(Gly13Asp) 9.2 %   Variants of Unknown Clinical Significance:  Gene Coding Predicted Protein Variant allele fraction  TET2 c.4493G>A p.(Arg1498His) 48.2 %  ETV6 c.776G>A p.(Arg259Gln) 49.1 %   Pertinent Phenotypic data: blasts express CD7, CD13, CD34, CD38, CD71, CD117, and HLA-DR.   He received cytarabine and daunorubicin (7+3) beginning 04/13/2020 and 05/27/2020.  Course has been complicated by fever and neutropenia with mucositis (04/27/2020) and gluteal cleft cellulitis (05/06/2020).Received induction with 7+3+HD with delayed count recovery. D42 BMBx revealed persistent disease and patient re-induced with 7+3.  He then went on to receive consolidation therapy with HiDAC and cycle 4 received on 11/04/2020.  He follows up with Dr. Malen Gauze at [facility]. Last bone marrow biopsy on 12/05/2020 which showed hypercellular bone marrow with trilineage hematopoiesis and 3% blasts by manual differential.   He is presently on azacitidine p.o. 300 mg daily 2 weeks on 2 weeks off.  on hold since 04/15/23    Interval history-patient was recently seen by Dr. Barbaraann Cao for his migraine headaches and was started on propranolol and and Imitrex.  He is tolerating that well without any significant side  effects.  Denies any complaints today  ECOG PS- 0 Pain scale- 0   Review of systems- Review of Systems  Constitutional:  Negative for chills, fever, malaise/fatigue and weight loss.  HENT:  Negative for congestion, ear discharge and nosebleeds.   Eyes:  Negative for blurred vision.  Respiratory:  Negative for cough, hemoptysis, sputum production, shortness of breath and wheezing.   Cardiovascular:  Negative for chest pain, palpitations, orthopnea and claudication.  Gastrointestinal:  Negative for abdominal pain, blood in stool, constipation, diarrhea, heartburn, melena, nausea and vomiting.  Genitourinary:  Negative for dysuria, flank pain, frequency, hematuria and urgency.  Musculoskeletal:  Negative for back pain, joint pain and myalgias.  Skin:  Negative for rash.  Neurological:  Positive for headaches. Negative for dizziness, tingling, focal weakness, seizures and weakness.  Endo/Heme/Allergies:  Does not bruise/bleed easily.  Psychiatric/Behavioral:  Negative for depression and suicidal ideas. The patient does not have insomnia.       Allergies  Allergen Reactions   Dapsone     Contraindication - G6PD deficient   Primaquine     Contraindication - G6PD deficient   Rasburicase     Contraindication - G6PD deficient     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HCC)    Agranulocytosis secondary to cancer chemotherapy (CODE) (HCC)    Chemotherapy induced neutropenia (HCC)    Pancreatitis    Pancytopenia (HCC)    Thrombocytopenia (HCC)    Tobacco use disorder      Past Surgical History:  Procedure Laterality Date   BONE MARROW BIOPSY     ESOPHAGOGASTRODUODENOSCOPY (EGD) WITH PROPOFOL N/A 07/24/2022   Procedure: ESOPHAGOGASTRODUODENOSCOPY (EGD) WITH PROPOFOL;  Surgeon: Midge Minium, MD;  Location: [facility] ENDOSCOPY;  Service: Endoscopy;  Laterality: N/A;    Social History   Socioeconomic History   Marital status: Single    Spouse  name: Not on file   Number of children: Not on file   Years of education: Not on file   Highest education level: Not on file  Occupational History   Not on file  Tobacco Use   Smoking status: Former    Current packs/day: 0.00    Average packs/day: 1 pack/day for 17.0 years (17.0 ttl pk-yrs)    Types: Cigarettes    Start date: 03/28/2003    Quit date: 03/27/2020    Years since quitting: 3.1   Smokeless tobacco: Never  Vaping Use   Vaping status: Never Used  Substance and Sexual Activity   Alcohol use: Yes    Alcohol/week: 7.0 standard drinks of alcohol    Types: 7 Cans of beer per week    Comment: Socially   Drug use: Not Currently   Sexual activity: Yes  Other Topics Concern   Not on file  Social History Narrative   Not on file   Social Determinants of Health   Financial Resource Strain: Medium Risk (08/26/2020)   Received from [facility], [facility]   Overall Financial Resource Strain (CARDIA)    Difficulty of Paying Living Expenses: Somewhat hard  Food Insecurity: No Food Insecurity (08/26/2020)   Received [facility], [facility]   Hunger Vital Sign    Worried About Running Out of Food in the Last Year: Never true    Ran Out of Food in the Last Year: Never true  Transportation Needs: Unmet Transportation Needs (01/08/2023)   PRAPARE - Administrator, Civil Service (Medical): Yes    Lack of Transportation (Non-Medical): Yes  Physical Activity: Not on file  Stress: Not on file  Social Connections: Not on file  Intimate Partner Violence: Not on file    Family History  Problem Relation Age of Onset   Hypertension Mother    Diabetes Mother    Diabetes Father    Prostate cancer Neg Hx    Kidney cancer Neg Hx    Bladder Cancer Neg Hx      Current Outpatient Medications:    glipiZIDE (GLUCOTROL) 5 MG tablet, TAKE 1 TABLET (5 MG TOTAL) BY MOUTH DAILY., Disp: 90 tablet, Rfl: 1   omeprazole (PRILOSEC) 20 MG capsule, TAKE 1 CAPSULE BY  MOUTH EVERY DAY, Disp: 90 capsule, Rfl: 2   ondansetron (ZOFRAN) 4 MG tablet, TAKE 1 TABLET BY MOUTH EVERY 8 HOURS AS NEEDED FOR NAUSEA AND VOMITING, Disp: 60 tablet, Rfl: 0   propranolol (INDERAL) 20 MG tablet, Take 1 tablet (20 mg total) by mouth 2 (two) times daily., Disp: 60 tablet, Rfl: 2   sildenafil (REVATIO) 20 MG tablet, Take 1-5 tablets by mouth 1 hour prior to intercourse, Disp: 30 tablet, Rfl: 11   SUMAtriptan (IMITREX) 25 MG tablet, Take 1 tablet (25 mg total) by mouth as needed for migraine. May repeat in 2 hours if headache persists or recurs. No more than 2 tablets in 24 hours, Disp: 10 tablet, Rfl: 0   valACYclovir (VALTREX) 500 MG tablet, TAKE 1 TABLET (500 MG TOTAL) BY MOUTH DAILY., Disp: 90 tablet, Rfl: 1   azaCITIDine (ONUREG) 300 MG tablet, TAKE 1 TABLET BY MOUTH ONCE DAILY ON DAYS 1-14 THEN OFF FOR THE NEXT 14 DAYS. REPEAT EVERY 28 DAYS. (Patient  not taking: Reported on 05/03/2023), Disp: 14 tablet, Rfl: 5   celecoxib (CELEBREX) 100 MG capsule, Take 1 capsule (100 mg total) by mouth 2 (two) times daily. (Patient not taking: Reported on 05/03/2023), Disp: 20 capsule, Rfl: 0  Physical exam:  Vitals:   05/07/23 1412  BP: 123/78  Pulse: 67  Resp: 18  Temp: 98.6 F (37 C)  TempSrc: Tympanic  SpO2: 100%  Weight: 212 lb 11.2 oz (96.5 kg)  Height: 5\' 11"  (1.803 m)   Physical Exam Cardiovascular:     Rate and Rhythm: Normal rate and regular rhythm.     Heart sounds: Normal heart sounds.  Pulmonary:     Effort: Pulmonary effort is normal.     Breath sounds: Normal breath sounds.  Abdominal:     General: Bowel sounds are normal.     Palpations: Abdomen is soft.  Skin:    General: Skin is warm and dry.  Neurological:     Mental Status: He is alert and oriented to person, place, and time.         Latest Ref Rng & Units 05/07/2023    1:20 PM  CMP  Glucose 70 - 99 mg/dL 166   BUN 6 - 20 mg/dL 17   Creatinine 0.63 - 1.24 mg/dL 0.16   Sodium 010 - 932 mmol/L 134    Potassium 3.5 - 5.1 mmol/L 4.0   Chloride 98 - 111 mmol/L 102   CO2 22 - 32 mmol/L 25   Calcium 8.9 - 10.3 mg/dL 9.3   Total Protein 6.5 - 8.1 g/dL 7.3   Total Bilirubin 0.3 - 1.2 mg/dL 0.7   Alkaline Phos 38 - 126 U/L 98   AST 15 - 41 U/L 18   ALT 0 - 44 U/L 30       Latest Ref Rng & Units 05/07/2023    1:20 PM  CBC  WBC 4.0 - 10.5 K/uL 1.1   Hemoglobin 13.0 - 17.0 g/dL 35.5   Hematocrit 73.2 - 52.0 % 35.2   Platelets 150 - 400 K/uL 124     Assessment and plan- Patient is a 47 y.o. male with history of AML on oral Vidaza here for routine follow-up  Patient has been on oral Vidaza for close to 2 years now.  His white count mostly fluctuates between 2.5-4 and has remained stable in that range with an ANC which has fluctuated between 1.2-1.9 over the last 2 years.  He was noted to have an ANC of 1.3 in March 2024 but given that there was no change in his hemoglobin and platelets which were normal this was continued to be monitored with no change in his medication.  Patient was noted to have an ANC of 0.9 on 04/15/2023 when Vidaza was held.  He has now not taken Vidaza for close to 3 weeks provide count today is 1.1 with an ANC of 0.2.  Hemoglobin has dropped from 14.5-11.8 with a platelet count of 124.  His neutropenia presently is especially concerning for possible disease recurrence.  I would recommend a repeat bone marrow biopsy at this time.  I did reach out to Dr. Vertell Limber from [facility] and he has received care at [facility] in the past.  He will be arranging for bone marrow biopsy to be done at [facility] and further management will be decided based on that.  Vidaza will continue to remain on hold at this time   Visit Diagnosis 1. Acute myeloid leukemia in remission (HCC)  2. Other neutropenia (HCC)      Dr. Owens Shark, MD, MPH [facility] [phone] 05/07/2023 3:24 PM

## 2023-05-09 ENCOUNTER — Telehealth: Payer: Self-pay | Admitting: *Deleted

## 2023-05-09 NOTE — Telephone Encounter (Signed)
Dr. Inda Castle wanted the [facility] to know that the [facility] is low and it is several times and dr Smith Robert is concerned that he might have a cancer again. She spoke to Dr. Vertell Limber and he will get bone marrow biopsy to see what is going on. Pt needs to stay away with people that are sick infections, covid because of his system can't fight the infection

## 2023-05-22 ENCOUNTER — Encounter: Payer: Self-pay | Admitting: Hematology and Oncology

## 2023-05-24 ENCOUNTER — Other Ambulatory Visit: Payer: Self-pay | Admitting: Nurse Practitioner

## 2023-05-24 NOTE — Telephone Encounter (Signed)
Okay to send the refill

## 2023-05-28 ENCOUNTER — Inpatient Hospital Stay: Payer: Medicare Other

## 2023-05-28 ENCOUNTER — Inpatient Hospital Stay: Payer: Medicare Other | Admitting: Oncology

## 2023-07-05 ENCOUNTER — Inpatient Hospital Stay: Payer: Medicare Other | Attending: Oncology | Admitting: Internal Medicine

## 2023-07-05 ENCOUNTER — Inpatient Hospital Stay: Payer: Medicare Other

## 2023-07-20 ENCOUNTER — Encounter: Payer: Self-pay | Admitting: Hematology and Oncology

## 2023-07-20 NOTE — Telephone Encounter (Signed)
error 

## 2023-12-16 ENCOUNTER — Encounter: Payer: Self-pay | Admitting: Hematology and Oncology

## 2024-05-27 DEATH — deceased
